# Patient Record
Sex: Female | Born: 1945
Health system: Southern US, Community
[De-identification: ages and names within clinical notes are randomized; demographics above are authoritative.]

## PROBLEM LIST (undated history)

## (undated) DIAGNOSIS — H269 Unspecified cataract: Secondary | ICD-10-CM

## (undated) DIAGNOSIS — M810 Age-related osteoporosis without current pathological fracture: Secondary | ICD-10-CM

## (undated) DIAGNOSIS — E785 Hyperlipidemia, unspecified: Secondary | ICD-10-CM

## (undated) DIAGNOSIS — K219 Gastro-esophageal reflux disease without esophagitis: Secondary | ICD-10-CM

## (undated) DIAGNOSIS — T7840XA Allergy, unspecified, initial encounter: Secondary | ICD-10-CM

## (undated) DIAGNOSIS — H9193 Unspecified hearing loss, bilateral: Secondary | ICD-10-CM

## (undated) DIAGNOSIS — M8580 Other specified disorders of bone density and structure, unspecified site: Secondary | ICD-10-CM

## (undated) DIAGNOSIS — E1165 Type 2 diabetes mellitus with hyperglycemia: Secondary | ICD-10-CM

## (undated) DIAGNOSIS — Z974 Presence of external hearing-aid: Secondary | ICD-10-CM

## (undated) DIAGNOSIS — C4442 Squamous cell carcinoma of skin of scalp and neck: Secondary | ICD-10-CM

## (undated) DIAGNOSIS — R011 Cardiac murmur, unspecified: Secondary | ICD-10-CM

## (undated) DIAGNOSIS — E119 Type 2 diabetes mellitus without complications: Secondary | ICD-10-CM

## (undated) HISTORY — DX: Unspecified cataract: H26.9

## (undated) HISTORY — DX: Age-related osteoporosis without current pathological fracture: M81.0

## (undated) HISTORY — DX: Cardiac murmur, unspecified: R01.1

## (undated) HISTORY — DX: Hyperlipidemia, unspecified: E78.5

## (undated) HISTORY — DX: Type 2 diabetes mellitus without complications: E11.9

## (undated) HISTORY — DX: Gastro-esophageal reflux disease without esophagitis: K21.9

## (undated) HISTORY — PX: COSMETIC SURGERY: SHX468

## (undated) HISTORY — DX: Type 2 diabetes mellitus with hyperglycemia: E11.65

## (undated) HISTORY — DX: Other specified disorders of bone density and structure, unspecified site: M85.80

## (undated) HISTORY — PX: RHINOPLASTY: SUR1284

## (undated) HISTORY — PX: EYE SURGERY: SHX253

## (undated) HISTORY — DX: Squamous cell carcinoma of skin of scalp and neck: C44.42

## (undated) HISTORY — PX: WRIST SURGERY: SHX841

## (undated) HISTORY — DX: Allergy, unspecified, initial encounter: T78.40XA

## (undated) HISTORY — DX: Unspecified hearing loss, bilateral: H91.93

---

## 2004-04-19 HISTORY — PX: GANGLION CYST EXCISION: SHX1691

## 2010-01-26 ENCOUNTER — Encounter: Payer: Self-pay | Admitting: Family Medicine

## 2010-01-26 ENCOUNTER — Encounter: Payer: Self-pay | Admitting: Internal Medicine

## 2010-01-26 ENCOUNTER — Other Ambulatory Visit: Admission: RE | Admit: 2010-01-26 | Discharge: 2010-01-26 | Payer: Self-pay | Admitting: Family Medicine

## 2010-01-26 ENCOUNTER — Ambulatory Visit: Payer: Self-pay | Admitting: Internal Medicine

## 2010-01-26 LAB — CONVERTED CEMR LAB: Pap Smear: NORMAL

## 2010-02-02 ENCOUNTER — Encounter: Payer: Self-pay | Admitting: Family Medicine

## 2010-02-02 ENCOUNTER — Encounter (INDEPENDENT_AMBULATORY_CARE_PROVIDER_SITE_OTHER): Payer: Self-pay | Admitting: *Deleted

## 2010-02-17 HISTORY — PX: COLONOSCOPY: SHX174

## 2010-02-20 ENCOUNTER — Encounter (INDEPENDENT_AMBULATORY_CARE_PROVIDER_SITE_OTHER): Payer: Self-pay | Admitting: *Deleted

## 2010-02-27 ENCOUNTER — Ambulatory Visit: Payer: Self-pay | Admitting: Internal Medicine

## 2010-02-27 ENCOUNTER — Encounter: Payer: Self-pay | Admitting: Family Medicine

## 2010-02-27 LAB — HM MAMMOGRAPHY: HM Mammogram: NORMAL

## 2010-03-02 ENCOUNTER — Encounter: Payer: Self-pay | Admitting: Family Medicine

## 2010-03-02 ENCOUNTER — Encounter (INDEPENDENT_AMBULATORY_CARE_PROVIDER_SITE_OTHER): Payer: Self-pay | Admitting: *Deleted

## 2010-03-11 ENCOUNTER — Ambulatory Visit: Payer: Self-pay | Admitting: Internal Medicine

## 2010-03-11 DIAGNOSIS — Z860100 Personal history of colon polyps, unspecified: Secondary | ICD-10-CM | POA: Insufficient documentation

## 2010-03-11 LAB — HM COLONOSCOPY

## 2010-03-15 ENCOUNTER — Encounter: Payer: Self-pay | Admitting: Internal Medicine

## 2010-05-19 NOTE — Letter (Signed)
Summary: [facility] Instructions  [facility]  [address] [place], [place] [address]   Phone: [phone]  Fax: [phone]       Betty Sanders    17-Aug-1945    MRN: [id number]        Procedure Day Dorna Bloom:  Wednesday 03/11/2010     Arrival Time:  8:00 am      Procedure Time:  9:00 am     Location of Procedure:                    _x _  [facility] (4th Floor)                        PREPARATION FOR COLONOSCOPY WITH MOVIPREP   Starting 5 days prior to your procedure Friday 11/18 do not eat nuts, seeds, popcorn, corn, beans, peas,  salads, or any raw vegetables.  Do not take any fiber supplements (e.g. Metamucil, Citrucel, and Benefiber).  THE DAY BEFORE YOUR PROCEDURE         DATE: Tuesday 11/22  1.  Drink clear liquids the entire day-NO SOLID FOOD  2.  Do not drink anything colored red or purple.  Avoid juices with pulp.  No orange juice.  3.  Drink at least 64 oz. (8 glasses) of fluid/clear liquids during the day to prevent dehydration and help the prep work efficiently.  CLEAR LIQUIDS INCLUDE: Water Jello Ice Popsicles Tea (sugar ok, no milk/cream) Powdered fruit flavored drinks Coffee (sugar ok, no milk/cream) Gatorade Juice: apple, white grape, white cranberry  Lemonade Clear bullion, consomm, broth Carbonated beverages (any kind) Strained chicken noodle soup Hard Candy                             4.  In the morning, mix first dose of MoviPrep solution:    Empty 1 Pouch A and 1 Pouch B into the disposable container    Add lukewarm drinking water to the top line of the container. Mix to dissolve    Refrigerate (mixed solution should be used within 24 hrs)  5.  Begin drinking the prep at 5:00 p.m. The MoviPrep container is divided by 4 marks.   Every 15 minutes drink the solution down to the next mark (approximately 8 oz) until the full liter is complete.   6.  Follow completed prep with 16 oz of clear liquid of your choice  (Nothing red or purple).  Continue to drink clear liquids until bedtime.  7.  Before going to bed, mix second dose of MoviPrep solution:    Empty 1 Pouch A and 1 Pouch B into the disposable container    Add lukewarm drinking water to the top line of the container. Mix to dissolve    Refrigerate  THE DAY OF YOUR PROCEDURE      DATE: Wednesday 11/23  Beginning at 4:00 a.m. (5 hours before procedure):         1. Every 15 minutes, drink the solution down to the next mark (approx 8 oz) until the full liter is complete.  2. Follow completed prep with 16 oz. of clear liquid of your choice.    3. You may drink clear liquids until 7:00 am (2 HOURS BEFORE PROCEDURE).   MEDICATION INSTRUCTIONS  Unless otherwise instructed, you should take regular prescription medications with a small sip of water   as early as possible the  morning of your procedure.           OTHER INSTRUCTIONS  You will need a responsible adult at least 63 years of age to accompany you and drive you home.   This person must remain in the waiting room during your procedure.  Wear loose fitting clothing that is easily removed.  Leave jewelry and other valuables at home.  However, you may wish to bring a book to read or  an iPod/MP3 player to listen to music as you wait for your procedure to start.  Remove all body piercing jewelry and leave at home.  Total time from sign-in until discharge is approximately 2-3 hours.  You should go home directly after your procedure and rest.  You can resume normal activities the  day after your procedure.  The day of your procedure you should not:   Drive   Make legal decisions   Operate machinery   Drink alcohol   Return to work  You will receive specific instructions about eating, activities and medications before you leave.    The above instructions have been reviewed and explained to me by   Sherren Kerns RN  February 27, 2010 8:12 AM    I fully understand  and can verbalize these instructions _____________________________ Date _________

## 2010-05-19 NOTE — Miscellaneous (Signed)
  Clinical Lists Changes  Observations: Added new observation of PAP DUE: 01/2013 (02/02/2010 10:36) Added new observation of PAP SMEAR: normal (01/26/2010 10:36)      Preventive Care Screening  Pap Smear:    Date:  01/26/2010    Next Due:  01/2013    Results:  normal

## 2010-05-19 NOTE — Letter (Signed)
Summary: Pre Visit Letter Revised  [facility]  [address] [place], [place] [address]   Phone: [phone]  Fax: [phone]        01/26/2010 MRN: [id number]  [facility] [address] [place], [place]  [address]             Procedure Date: 11-23 at 9am   Welcome to the Gastroenterology Division at [facility].    You are scheduled to see a nurse for your pre-procedure visit on 02-27-10 at 8am on the 3rd floor at [facility], [address]  We ask that you try to arrive at our office 15 minutes prior to your appointment time to allow for check-in.  Please take a minute to review the attached form.  If you answer "Yes" to one or more of the questions on the first page, we ask that you call the person listed at your earliest opportunity.  If you answer "No" to all of the questions, please complete the rest of the form and bring it to your appointment.    Your nurse visit will consist of discussing your medical and surgical history, your immediate family medical history, and your medications.   If you are unable to list all of your medications on the form, please bring the medication bottles to your appointment and we will list them.  We will need to be aware of both prescribed and over the counter drugs.  We will need to know exact dosage information as well.    Please be prepared to read and sign documents such as consent forms, a financial agreement, and acknowledgement forms.  If necessary, and with your consent, a friend or relative is welcome to sit-in on the nurse visit with you.  Please bring your insurance card so that we may make a copy of it.  If your insurance requires a referral to see a specialist, please bring your referral form from your primary care physician.  No co-pay is required for this nurse visit.     If you cannot keep your appointment, please call [phone] to cancel or reschedule prior to your appointment date.   This allows [place] the opportunity to schedule an appointment for another patient in need of care.    Thank you for choosing [facility] for your medical needs.  We appreciate the opportunity to care for you.  Please visit [place] at our website  to learn more about our practice.  Sincerely, The Gastroenterology Division

## 2010-05-19 NOTE — Assessment & Plan Note (Signed)
Summary: NEW PT TO ESTABH/DLO   Vital Signs:  Patient profile:   65 year old female Height:      65 inches Weight:      197.75 pounds BMI:     33.03 Temp:     98.6 degrees F oral Pulse rate:   74 / minute Pulse rhythm:   regular BP sitting:   110 / 60  (left arm) Cuff size:   large  Vitals Entered By: Selena Batten Dance CMA Duncan Dull) (January 26, 2010 9:38 AM) CC: New patient to establish care   History of Present Illness: CC: new patient to establish  Used to live in [place].    told may have RA 2/2 elevated RF.  h/o arrhythmias and murmur in past.  No SOB, LOC.    went through menopause at around early 15s ( ~65 yo).  preventative - last mammo 3 years ago, due.  unsure about last pap smear.  has had stool checked always normal.  UTD tetanus 2008.  Receives flu shot at work.    -  Date:  10/02/2007    TD booster Td  Current Medications (verified): 1)  None  Allergies (verified): No Known Drug Allergies  Past History:  Past Surgical History: rhinoplasty 1970s L wrist cyst removed 2006  Family History: M: D 63yo healthy F: D 62yo lung CA Cousin: colon CA Aunt: BRCA Aunt: Cervical CA MGF: DM PGF: CVA  No CAD/MI, no other CA  Social History: no smoking, rarely EtOH, no rec drugs Occupation: Engineer, manufacturing systems with [employer] Lives with husband, 1 dog (Yorkie)  Review of Systems  The patient denies anorexia, fever, weight loss, weight gain, vision loss, decreased hearing, hoarseness, chest pain, syncope, dyspnea on exertion, peripheral edema, prolonged cough, headaches, hemoptysis, abdominal pain, melena, hematochezia, severe indigestion/heartburn, hematuria, incontinence, muscle weakness, suspicious skin lesions, transient blindness, difficulty walking, depression, unusual weight change, and breast masses.         + mild indigestion sees derm for spot on scalp  Physical Exam  General:  Well-developed,well-nourished,in no  acute distress; alert,appropriate and cooperative throughout examination Head:  Normocephalic and atraumatic without obvious abnormalities. No apparent alopecia or balding. Eyes:  No corneal or conjunctival inflammation noted. EOMI. Perrla.  Ears:  External ear exam shows no significant lesions or deformities.  Otoscopic examination reveals clear canals, tympanic membranes are intact bilaterally without bulging, retraction, inflammation or discharge. Hearing is grossly normal bilaterally. Nose:  External nasal examination shows no deformity or inflammation. Nasal mucosa are pink and moist without lesions or exudates. Mouth:  Oral mucosa and oropharynx without lesions or exudates.  Teeth in good repair. Neck:  No deformities, masses, or tenderness noted.  no bruits Lungs:  Normal respiratory effort, chest expands symmetrically. Lungs are clear to auscultation, no crackles or wheezes. Heart:  nl S1, S2, 2/6 SEM Abdomen:  Bowel sounds positive,abdomen soft and non-tender without masses, organomegaly or hernias noted. Genitalia:  Pelvic Exam:        External: normal female genitalia without lesions or masses        Vagina: normal without lesions or masses        Cervix: normal without lesions or masses        Adnexa: normal bimanual exam without masses or fullness        Uterus: normal by palpation        Pap smear: performed Msk:  No deformity or scoliosis noted of thoracic or lumbar spine.  Pulses:  2+ rad pulses Extremities:  No clubbing, cyanosis, edema, or deformity noted with normal full range of motion of all joints.   Neurologic:  CN grossly intact, station and gait intact Skin:  Intact without suspicious lesions or rashes Psych:  full affect   Impression & Recommendations:  Problem # 1:  HEALTH MAINTENANCE EXAM (ICD-V70.0) updated immunizations.  preventative services due scheduled.  Problem # 2:  ROUTINE GYNECOLOGICAL EXAMINATION (ICD-V72.31) with pap.  Problem # 3:  SPECIAL  SCREENING FOR MALIGNANT NEOPLASMS COLON (ICD-V76.51) set up with colonoscopy. fmhx coloncancer 50's cousin, passed away. Orders: Gastroenterology Referral (GI)  Problem # 4:  OTHER SCREENING MAMMOGRAM (ICD-V76.12) set up with mammogram.  hasn't had one in several years, always normal. Orders: Radiology Referral (Radiology)  Patient Instructions: 1)  Pleasure to meet you today. 2)  Please return as needed or next year for CPE. 3)  Please bring in copy of blood work results in November for work. 4)  Call clinic with questions.  Prior Medications: None Current Allergies (reviewed today): No known allergies   Appended Document: Orders Update    Clinical Lists Changes  Problems: Added new problem of SCREENING FOR MALIGNANT NEOPLASM OF THE CERVIX (ICD-V76.2) Orders: Added new Service order of Pap Smear, Thin Prep ( Collection of) [phone]) - Signed

## 2010-05-19 NOTE — Miscellaneous (Signed)
  Clinical Lists Changes  Observations: Added new observation of MAMMO DUE: 02/2011 (03/02/2010 9:05) Added new observation of MAMMOGRAM: normal (02/27/2010 9:06)      Preventive Care Screening  Mammogram:    Date:  02/27/2010    Next Due:  02/2011    Results:  normal     Birads-1

## 2010-05-19 NOTE — Letter (Signed)
Summary: Patient Notice- Polyp Results  [facility]  [address] [place], [place] [address]   Phone: [phone]  Fax: [phone]        March 15, 2010 MRN: [id number]    [facility] [address] [place], [place]  [address]    Dear Ms. Piercefield,  One of the polyps removed from your colon was adenomatous. This means that it was pre-cancerous or that it had the potential to change into cancer over time. the other polyps were hyperplastic polyps and in general, do not have cancer potential.  I recommend that you have a repeat colonoscopy in 5 years to determine if you have developed any new polyps over time and screen for colorectal cancer. If you develop any new rectal bleeding, abdominal pain or significant bowel habit changes, please contact us before then.   Please call us if you are having persistent problems or have questions about your condition that have not been fully answered at this time.  Sincerely,  Iva Boop MD, [facility]  This letter has been electronically signed by your physician.  Appended Document: Patient Notice- Polyp Results Letter mailed  Appended Document: Patient Notice- Polyp Results    Clinical Lists Changes  Observations: Added new observation of PAST SURG HX: rhinoplasty 1970s L wrist cyst removed 2006 Colonoscopy 02/2010 - tubular adenoma polyp x1, rpt 5 years [Gessner] (03/18/2010 11:27)        Past History:  Past Surgical History: rhinoplasty 1970s L wrist cyst removed 2006 Colonoscopy 02/2010 - tubular adenoma polyp x1, rpt 5 years [Gessner]

## 2010-05-19 NOTE — Procedures (Signed)
Summary: Colonoscopy  Patient: [name] Note: All result statuses are Final unless otherwise noted.  Tests: (1) Colonoscopy (COL)   COL Colonoscopy           DONE     [facility]     [address]     [place], [place]  [address]           COLONOSCOPY PROCEDURE REPORT           PATIENT:  Betty Sanders, Betty Sanders  MR#:  [id number]     BIRTHDATE:  May 22, 1945, 64 yrs. old  GENDER:  female     ENDOSCOPIST:  Iva Boop, MD, [facility]     REF. BY:  Tillman Abide, M.D.     PROCEDURE DATE:  03/11/2010     PROCEDURE:  Colonoscopy with snare polypectomy     ASA CLASS:  Class I     INDICATIONS:  Routine Risk Screening     MEDICATIONS:   Fentanyl 50 mcg IV, Versed 6 mg IV           DESCRIPTION OF PROCEDURE:   After the risks benefits and     alternatives of the procedure were thoroughly explained, informed     consent was obtained.  Digital rectal exam was performed and     revealed no abnormalities.   The LB [id number]-[id number] endoscope     was introduced through the anus and advanced to the cecum, which     was identified by both the appendix and ileocecal valve, without     limitations.  The quality of the prep was excellent, using     MoviPrep.  The instrument was then slowly withdrawn as the colon     was fully examined. Insertion: 3:36 minutes Withdrawal: 10:50     minutes     <<PROCEDUREIMAGES>>           FINDINGS:  Four polyps were found. Transverse (1), splenic flexure     (1), rectal (2) diminutive polyps removed. Polyps were snared     without cautery. Retrieval was successful. This was otherwise a     normal examination of the colon. Includes right colon     retroflexion.   Retroflexed views in the rectum revealed no     abnormalities.    The scope was then withdrawn from the patient     and the procedure completed.           COMPLICATIONS:  None     ENDOSCOPIC IMPRESSION:     1) Four diminutive polyps removed     2) Otherwise normal examination, excellent  prep           REPEAT EXAM:  In for Colonoscopy, pending biopsy results.           Iva Boop, MD, [facility]           CC:  Karie Schwalbe, MD and The Patient           n.     eSIGNED:   Iva Boop at 03/11/2010 09:59 AM           Etta Grandchild, [id number]  Note: An exclamation mark (!) indicates a result that was not dispersed into the flowsheet. Document Creation Date: 03/11/2010 9:59 AM _______________________________________________________________________  (1) Order result status: Final Collection or observation date-time: 03/11/2010 09:49 Requested date-time:  Receipt date-time:  Reported date-time:  Referring Physician:   Ordering Physician: Stan Head [phone]) Specimen Source:  Source: Launa Grill  Order Number: [id number] Lab site:   Appended Document: Colonoscopy   Colonoscopy  Procedure date:  03/11/2010  Findings:          1) Four diminutive polyps removed 1 ADENOMA and HYPERPLASTIC POLYPS (3)     2) Otherwise normal examination, excellent prep  Comments:      Repeat colonoscopy in 5 years.   Procedures Next Due Date:    Colonoscopy: 03/2015   Appended Document: Colonoscopy     Procedures Next Due Date:    Colonoscopy: 03/2015

## 2010-05-19 NOTE — Letter (Signed)
Summary: Results Follow up Letter  [place] [facility]  [address] [place], [place] [address]   Phone: [phone]  Fax: [phone]    02/02/2010 MRN: [id number]  [facility] [address] [place], [place]  [address]  Dear Ms. Topham,  The following are the results of your recent test(s):  Test         Result    Pap Smear:        Normal __X___  Not Normal _____ Comments: Repeat in 3 years ______________________________________________________ Cholesterol: LDL(Bad cholesterol):         Your goal is less than:         HDL (Good cholesterol):       Your goal is more than: Comments:  ______________________________________________________ Mammogram:        Normal _____  Not Normal _____ Comments:  ___________________________________________________________________ Hemoccult:        Normal _____  Not normal _______ Comments:    _____________________________________________________________________ Other Tests:    We routinely do not discuss normal results over the telephone.  If you desire a copy of the results, or you have any questions about this information we can discuss them at your next office visit.   Sincerely,      Dr. Eustaquio Boyden

## 2010-05-19 NOTE — Letter (Signed)
Summary: Results Follow up Letter  [place] [facility]  [address] [place], [place] [address]   Phone: [phone]  Fax: [phone]    03/02/2010 MRN: [id number]  [facility] [address] [place], [place]  [address]  Dear Ms. Ruelas,  The following are the results of your recent test(s):  Test         Result    Pap Smear:        Normal _____  Not Normal _____ Comments: ______________________________________________________ Cholesterol: LDL(Bad cholesterol):         Your goal is less than:         HDL (Good cholesterol):       Your goal is more than: Comments:  ______________________________________________________ Mammogram:        Normal __X___  Not Normal _____ Comments: Repeat in 1 year  ___________________________________________________________________ Hemoccult:        Normal _____  Not normal _______ Comments:    _____________________________________________________________________ Other Tests:    We routinely do not discuss normal results over the telephone.  If you desire a copy of the results, or you have any questions about this information we can discuss them at your next office visit.   Sincerely,      [place]ia [place]

## 2010-05-19 NOTE — Miscellaneous (Signed)
Summary: previsit prep/rm  Clinical Lists Changes  Medications: Added new medication of MOVIPREP 100 GM  SOLR (PEG-KCL-NACL-NASULF-NA ASC-C) As per prep instructions. - Signed Rx of MOVIPREP 100 GM  SOLR (PEG-KCL-NACL-NASULF-NA ASC-C) As per prep instructions.;  #1 x 0;  Signed;  Entered by: Sherren Kerns RN;  Authorized by: Iva Boop MD, [facility];  Method used: Electronically to [facility]*, [address], [address] Shipping/mailing, [place], [place]  [address], Ph: [phone], Fax: [phone] Observations: Added new observation of ALLERGY REV: Done (02/27/2010 8:00)    Prescriptions: MOVIPREP 100 GM  SOLR (PEG-KCL-NACL-NASULF-NA ASC-C) As per prep instructions.  #1 x 0   Entered by:   Sherren Kerns RN   Authorized by:   Iva Boop MD, [facility]   Signed by:   Sherren Kerns RN on 02/27/2010   Method used:   Electronically to        [facility]* (retail)       [address]       [address] Shipping/mailing       [place], [place]  [address]       Ph: [phone]       Fax: [phone]   RxID:   [id number]

## 2011-04-01 ENCOUNTER — Encounter: Payer: Self-pay | Admitting: Family Medicine

## 2011-04-01 ENCOUNTER — Telehealth: Payer: Self-pay | Admitting: Internal Medicine

## 2011-04-01 NOTE — Telephone Encounter (Signed)
Patient has flu like symptoms.  Made an appt. For in the morning.

## 2011-04-02 ENCOUNTER — Ambulatory Visit (INDEPENDENT_AMBULATORY_CARE_PROVIDER_SITE_OTHER): Payer: 59 | Admitting: Family Medicine

## 2011-04-02 ENCOUNTER — Encounter: Payer: Self-pay | Admitting: Family Medicine

## 2011-04-02 DIAGNOSIS — R6889 Other general symptoms and signs: Secondary | ICD-10-CM

## 2011-04-02 NOTE — Progress Notes (Signed)
  Subjective:    Patient ID: Betty Sanders, female    DOB: 18-Aug-1945, 65 y.o.   MRN: [id number]  HPI CC: flu?  5d ago started having chills,fever sudden onset.  Yesterday Tmax 101.  Earache, hoarse.  Felt tightness in chest and HA described as throughout head, no neck stiffness.  Has been in bed all week.  Today started feeling better.  Last fever was mid day yesterday.  Diarrhea x 1.  + body aches.  Has tried advil, mucinex.  Drinking plenty of water, OJ.  No abd pain, n/v.  No ear pain/tooth pain.  Had flu shot this year.  No sick contacts at home.  No smokers at home.  No h/o asthma.  Getting dental work, just completed 2 courses of antibiotics.  Review of Systems Per HPI    Objective:   Physical Exam  Nursing note and vitals reviewed. Constitutional: She appears well-developed and well-nourished. No distress.  HENT:  Head: Normocephalic and atraumatic.  Right Ear: Hearing, tympanic membrane, external ear and ear canal normal.  Left Ear: Hearing, tympanic membrane, external ear and ear canal normal.  Nose: No mucosal edema or rhinorrhea. Right sinus exhibits no maxillary sinus tenderness and no frontal sinus tenderness. Left sinus exhibits no maxillary sinus tenderness and no frontal sinus tenderness.  Mouth/Throat: Uvula is midline, oropharynx is clear and moist and mucous membranes are normal. No oropharyngeal exudate, posterior oropharyngeal edema, posterior oropharyngeal erythema or tonsillar abscesses.       Congested  Eyes: Conjunctivae and EOM are normal. Pupils are equal, round, and reactive to light. No scleral icterus.  Neck: Normal range of motion. Neck supple.       FROM without pain.  No midline neck tenderness  Cardiovascular: Normal rate, regular rhythm, normal heart sounds and intact distal pulses.   No murmur heard. Pulmonary/Chest: Effort normal and breath sounds normal. No respiratory distress. She has no wheezes. She has no rales.  Lymphadenopathy:   She has no cervical adenopathy.  Skin: Skin is warm and dry. No rash noted.       Assessment & Plan:

## 2011-04-02 NOTE — Assessment & Plan Note (Signed)
Sudden onset resp illness with spiking fevers, out of commission for last 5 days. Anticipate influenza. Supportive care.  Advised of red flags to watch out for indicating bacterial infection, notify me if that is the case.

## 2011-04-20 DIAGNOSIS — C4442 Squamous cell carcinoma of skin of scalp and neck: Secondary | ICD-10-CM

## 2011-04-20 HISTORY — DX: Squamous cell carcinoma of skin of scalp and neck: C44.42

## 2012-02-18 DIAGNOSIS — E1169 Type 2 diabetes mellitus with other specified complication: Secondary | ICD-10-CM | POA: Insufficient documentation

## 2012-02-18 DIAGNOSIS — IMO0002 Reserved for concepts with insufficient information to code with codable children: Secondary | ICD-10-CM

## 2012-02-18 DIAGNOSIS — E118 Type 2 diabetes mellitus with unspecified complications: Secondary | ICD-10-CM | POA: Insufficient documentation

## 2012-02-18 DIAGNOSIS — E1165 Type 2 diabetes mellitus with hyperglycemia: Secondary | ICD-10-CM

## 2012-02-18 HISTORY — DX: Type 2 diabetes mellitus with hyperglycemia: E11.65

## 2012-02-18 HISTORY — DX: Reserved for concepts with insufficient information to code with codable children: IMO0002

## 2012-02-23 ENCOUNTER — Other Ambulatory Visit: Payer: Self-pay | Admitting: Family Medicine

## 2012-02-23 ENCOUNTER — Ambulatory Visit (INDEPENDENT_AMBULATORY_CARE_PROVIDER_SITE_OTHER): Payer: 59 | Admitting: Family Medicine

## 2012-02-23 ENCOUNTER — Encounter: Payer: Self-pay | Admitting: Family Medicine

## 2012-02-23 VITALS — BP 110/70 | HR 60 | Temp 98.1°F | Ht 64.25 in | Wt 181.8 lb

## 2012-02-23 DIAGNOSIS — Z1231 Encounter for screening mammogram for malignant neoplasm of breast: Secondary | ICD-10-CM

## 2012-02-23 DIAGNOSIS — Z0000 Encounter for general adult medical examination without abnormal findings: Secondary | ICD-10-CM | POA: Insufficient documentation

## 2012-02-23 DIAGNOSIS — Z0001 Encounter for general adult medical examination with abnormal findings: Secondary | ICD-10-CM | POA: Insufficient documentation

## 2012-02-23 DIAGNOSIS — R739 Hyperglycemia, unspecified: Secondary | ICD-10-CM | POA: Insufficient documentation

## 2012-02-23 LAB — BASIC METABOLIC PANEL
BUN: 9 mg/dL (ref 6–23)
GFR: 83.93 mL/min (ref >60.00)
Potassium: 4.3 mEq/L (ref 3.5–5.1)
Sodium: 137 mEq/L (ref 135–145)

## 2012-02-23 LAB — LDL CHOLESTEROL, DIRECT: Direct LDL: 162.2 mg/dL

## 2012-02-23 LAB — LIPID PANEL
HDL: 29.6 mg/dL — ABNORMAL LOW (ref >39.00)
Total CHOL/HDL Ratio: 8
VLDL: 33.2 mg/dL (ref 0.0–40.0)

## 2012-02-23 NOTE — Assessment & Plan Note (Addendum)
Preventative protocols reviewed and updated unless pt declined. Discussed healthy diet and lifestyle. UTD immunizations.  Pt will check into zostavax and schedule mammogram. Recommended she f/u with derm for skin check. Blood work today.

## 2012-02-23 NOTE — Patient Instructions (Signed)
Call to schedule mammogram.  If any issues, call us and we will schedule for you. Call your insurace about the shingles shot to see if it is covered or how much it would cost and where is cheaper (here or pharmacy).  If you want to receive here, call for nurse visit. Blood work today. Return to see dermatologist. Good to see you today.  Call us with questions.

## 2012-02-23 NOTE — Progress Notes (Signed)
Subjective:    Patient ID: Betty Sanders, female    DOB: [birth year], 66 y.o.   MRN: [id number]  HPI CC: CPE  Husband says she has trouble hearing.  Would like hearing screen today.  Preventative: Colonoscopy 02/2010, Dr. Leone Payor, rec rpt 5 yrs given polyps. Mammogram 02/2010, Birads 1 would like to reschedule. Well woman exam - always normal.  Last pap was 02/2010.   Flu done at work. Td 2009 Shingles shot - discussed, pt wiil call insurance  Denies depression, sadness, anhedonia. No falls in last year.  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 Activity: kayak, restarting treadmill, wears fit bit - wants to get to 10000steps/day Diet: My.fitness.pal 1200cal/day, good water, fruits/vegetables daily  Medications and allergies reviewed and updated in chart.  Past histories reviewed and updated if relevant as below. Patient Active Problem List  Diagnosis  . Influenza-like illness  . Healthcare maintenance   Past Medical History  Diagnosis Date  . 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 Mayford Knife)   Past Surgical History  Procedure Date  . Rhinoplasty 1970's  . Ganglion cyst excision 2006    Left wrist  . Colonoscopy 11/11    tubular adenoma polyp x 1; repeat 5 years--Dr. Leone Payor   History  Substance Use Topics  . Smoking status: Former Smoker    Quit date: 04/19/1968  . Smokeless tobacco: Never Used  . Alcohol Use: Yes     Comment: Rare   Family History  Problem Relation Age of Onset  . Lung cancer Father   . Colon cancer Cousin   . Breast cancer      Aunt  . Cervical cancer      Aunt  . Diabetes Maternal Grandfather   . Stroke Paternal Grandfather   . CAD Neg Hx    Allergies  Allergen Reactions  . Sulfa Antibiotics     Possible rxn, unsure.   No current outpatient prescriptions on file prior to visit.    Review of Systems  Constitutional: Negative for fever, chills,  activity change, appetite change, fatigue and unexpected weight change.  HENT: Negative for hearing loss and neck pain.   Eyes: Negative for visual disturbance.  Respiratory: Negative for cough, chest tightness, shortness of breath and wheezing.   Cardiovascular: Negative for chest pain, palpitations and leg swelling.  Gastrointestinal: Negative for nausea, vomiting, abdominal pain, diarrhea, constipation, blood in stool and abdominal distention.  Genitourinary: Negative for hematuria and difficulty urinating.  Musculoskeletal: Negative for myalgias and arthralgias.  Skin: Negative for rash.  Neurological: Positive for headaches. Negative for dizziness, seizures and syncope.  Psychiatric/Behavioral: Negative for dysphoric mood. The patient is not nervous/anxious.        Objective:   Physical Exam  Nursing note and vitals reviewed. Constitutional: She is oriented to person, place, and time. She appears well-developed and well-nourished. No distress.  HENT:  Head: Normocephalic and atraumatic.  Right Ear: Hearing, tympanic membrane, external ear and ear canal normal.  Left Ear: Hearing, tympanic membrane, external ear and ear canal normal.  Nose: Nose normal.  Mouth/Throat: Uvula is midline, oropharynx is clear and moist and mucous membranes are normal. No oropharyngeal exudate, posterior oropharyngeal edema, posterior oropharyngeal erythema or tonsillar abscesses.  Eyes: Conjunctivae normal and EOM are normal. Pupils are equal, round, and reactive to light. No scleral icterus.  Neck: Normal range of motion. Neck supple.  Cardiovascular: Normal rate, regular rhythm,  normal heart sounds and intact distal pulses.   No murmur heard. Pulses:      Radial pulses are 2+ on the right side, and 2+ on the left side.  Pulmonary/Chest: Effort normal and breath sounds normal. No respiratory distress. She has no wheezes. She has no rales. Right breast exhibits no inverted nipple, no mass, no nipple  discharge, no skin change and no tenderness. Left breast exhibits inverted nipple. Left breast exhibits no mass, no nipple discharge, no skin change and no tenderness. Breasts are symmetrical.  Abdominal: Soft. Bowel sounds are normal. She exhibits no distension and no mass. There is no tenderness. There is no rebound and no guarding.  Musculoskeletal: Normal range of motion. She exhibits no edema.  Lymphadenopathy:    She has no cervical adenopathy.    She has no axillary adenopathy.       Right axillary: No lateral adenopathy present.       Left axillary: No lateral adenopathy present.      Right: No supraclavicular adenopathy present.       Left: No supraclavicular adenopathy present.  Neurological: She is alert and oriented to person, place, and time.       CN grossly intact, station and gait intact  Skin: Skin is warm and dry. No rash noted.       AKs on forehead.  Psychiatric: She has a normal mood and affect. Her behavior is normal. Judgment and thought content normal.      Assessment & Plan:

## 2012-02-23 NOTE — Addendum Note (Signed)
Addended by: Eustaquio Boyden on: 02/23/2012 09:19 AM   Modules accepted: Orders

## 2012-02-24 ENCOUNTER — Telehealth: Payer: Self-pay

## 2012-02-24 NOTE — Telephone Encounter (Signed)
Patient notified. Shirlee Limerick please send order to [facility] fax # [phone].

## 2012-02-24 NOTE — Telephone Encounter (Signed)
Order faxed. mK

## 2012-02-24 NOTE — Telephone Encounter (Signed)
Pt left v/m returning call and requesting call back for lab results and also pt request mammogram order faxed to [facility] [phone].

## 2012-03-01 ENCOUNTER — Other Ambulatory Visit (INDEPENDENT_AMBULATORY_CARE_PROVIDER_SITE_OTHER): Payer: 59

## 2012-03-01 DIAGNOSIS — R739 Hyperglycemia, unspecified: Secondary | ICD-10-CM

## 2012-03-01 DIAGNOSIS — R7309 Other abnormal glucose: Secondary | ICD-10-CM

## 2012-03-01 LAB — HEMOGLOBIN A1C: Hgb A1c MFr Bld: 10.2 % — ABNORMAL HIGH (ref 4.6–6.5)

## 2012-03-08 ENCOUNTER — Encounter: Payer: Self-pay | Admitting: Family Medicine

## 2012-03-08 ENCOUNTER — Ambulatory Visit (INDEPENDENT_AMBULATORY_CARE_PROVIDER_SITE_OTHER): Payer: 59 | Admitting: Family Medicine

## 2012-03-08 ENCOUNTER — Telehealth: Payer: Self-pay

## 2012-03-08 VITALS — BP 116/68 | HR 60 | Temp 98.4°F | Wt 179.8 lb

## 2012-03-08 DIAGNOSIS — E1165 Type 2 diabetes mellitus with hyperglycemia: Secondary | ICD-10-CM

## 2012-03-08 DIAGNOSIS — E785 Hyperlipidemia, unspecified: Secondary | ICD-10-CM | POA: Insufficient documentation

## 2012-03-08 MED ORDER — METFORMIN HCL 500 MG PO TABS: 500.0000 mg | ORAL_TABLET | Freq: Two times a day (BID) | ORAL | Status: DC

## 2012-03-08 MED ORDER — ONETOUCH ULTRA SYSTEM W/DEVICE KIT: 1.0000 | PACK | Freq: Once | Status: DC

## 2012-03-08 MED ORDER — GLUCOSE BLOOD VI STRP: ORAL_STRIP | Status: DC

## 2012-03-08 NOTE — Assessment & Plan Note (Signed)
LDL 160s, goal <100.  Discussed statin. Pt opts to wait until next visit prior to starting new med.

## 2012-03-08 NOTE — Patient Instructions (Signed)
You have developed diabetes. Start metformin 500mg  nightly for 1 week then increase to twice daily.  Watch stomach upset. Return in 6 weeks for follow up. Keep a good log 1 week prior to coming in - with some days checking fasting and some days checking 2 hours after meal. Continue exercise and healthy diet. Call us with questions. I've sent in glucose meter as well.

## 2012-03-08 NOTE — Progress Notes (Signed)
  Subjective:    Patient ID: Betty Sanders, female    DOB: 1945/07/25, 66 y.o.   MRN: [id number]  HPI CC: new dx DM  Betty Sanders returns today after recent CPE where she was found to have very elevated sugars.  Lab Results  Component Value Date   HGBA1C 10.2* 03/01/2012    No significant fmhx DM.  Husband is diabetic so pt has meter.  Pt checking fasting in am 150-160s.    Doing fitness pal.  Diet - 50% carbs, 25% fat, 25% protein.  Starting to exercise more - 1 hour on treadmill.  Good fruits/vegetables daily. Has lost about 7 lbs on home scale.   Wt Readings from Last 3 Encounters:  03/08/12 179 lb 12 oz (81.534 kg)  02/23/12 181 lb 12 oz (82.441 kg)  04/02/11 184 lb 4 oz (83.575 kg)   Went with husband to diabetes education 3 yrs ago.  Would like to defer DSME for now.  Past Medical History  Diagnosis Date  . 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 Mayford Knife)  . Type 2 diabetes mellitus, uncontrolled 02/2012     Review of Systems Per HPI    Objective:   Physical Exam  Nursing note and vitals reviewed. Constitutional: She appears well-developed and well-nourished. No distress.  HENT:  Head: Normocephalic and atraumatic.  Right Ear: External ear normal.  Left Ear: External ear normal.  Nose: Nose normal.  Mouth/Throat: Oropharynx is clear and moist. No oropharyngeal exudate.  Eyes: Conjunctivae normal and EOM are normal. Pupils are equal, round, and reactive to light. No scleral icterus.  Neck: Normal range of motion. Neck supple. Carotid bruit is not present.  Cardiovascular: Normal rate, regular rhythm and intact distal pulses.   Murmur (3/6 systolic) heard. Pulmonary/Chest: Effort normal and breath sounds normal. No respiratory distress. She has no wheezes. She has no rales.  Musculoskeletal: She exhibits no edema.       Diabetic foot exam: Normal inspection No skin breakdown No calluses  Normal DP/PT pulses Normal sensation  to light tough and monofilament Nails normal  Lymphadenopathy:    She has no cervical adenopathy.  Skin: Skin is warm and dry. No rash noted.  Psychiatric: She has a normal mood and affect.       Assessment & Plan:

## 2012-03-08 NOTE — Telephone Encounter (Signed)
Betty Sanders with [facility] outpatient pharmacy;Prescription sent in for one touch ultra glucose meter; does Dr Sharen Hones specifically want this meter or can a generic form of this meter be given.Please advise.

## 2012-03-08 NOTE — Assessment & Plan Note (Signed)
New dx. Declines DSME for now Discussed dx and management of diabetes.  Discussed healthy diet and lifestyle Start metformin. Keep log of sugars (sent in glucometer) rtc 6 wk for f/u Pt agrees with plan.

## 2012-03-09 NOTE — Telephone Encounter (Signed)
plz notify - ok to use generic glucometer and strips.

## 2012-03-09 NOTE — Telephone Encounter (Signed)
Pharmacy advised  

## 2012-03-10 ENCOUNTER — Encounter: Payer: Self-pay | Admitting: *Deleted

## 2012-04-26 ENCOUNTER — Ambulatory Visit: Payer: 59 | Admitting: Family Medicine

## 2012-05-03 ENCOUNTER — Ambulatory Visit (INDEPENDENT_AMBULATORY_CARE_PROVIDER_SITE_OTHER): Payer: 59 | Admitting: Family Medicine

## 2012-05-03 ENCOUNTER — Encounter: Payer: Self-pay | Admitting: Family Medicine

## 2012-05-03 VITALS — BP 118/72 | HR 60 | Temp 98.1°F | Wt 172.2 lb

## 2012-05-03 DIAGNOSIS — E785 Hyperlipidemia, unspecified: Secondary | ICD-10-CM

## 2012-05-03 DIAGNOSIS — E1165 Type 2 diabetes mellitus with hyperglycemia: Secondary | ICD-10-CM

## 2012-05-03 MED ORDER — METFORMIN HCL 500 MG PO TABS: 1000.0000 mg | ORAL_TABLET | Freq: Two times a day (BID) | ORAL | Status: DC

## 2012-05-03 NOTE — Assessment & Plan Note (Addendum)
Recent dx.  Checking sugars daily.  Improved #s but still not at goal. Slowly increase metformin to 1000mg  bid. emmi soln prescription provided today - generic diabetes type 2 and high A1c result.

## 2012-05-03 NOTE — Patient Instructions (Addendum)
Let's increase metformin to 1 pill in am and 2 pills at night for next 1-2 weeks.  If sugars are staying elevated, increase to two pills twice daily. Sugars are slowly improving but still a bit high. Exercises does help control fluctuations. Return in 3 months fasting for blood work and afterwards for visit. [facility] prescription today.

## 2012-05-03 NOTE — Progress Notes (Signed)
  Subjective:    Patient ID: Betty Sanders, female    DOB: [birth year], 67 y.o.   MRN: [id number]  HPI CC: f/u DM  Mrs Horsford presents today as folow up for recently diagnosed diabetes at physical this past year.  Lab Results  Component Value Date   HGBA1C 10.2* 03/01/2012    Has implemented healthy diet changes - see prior visit for details.  Was walking on treadmill 3 miles a day.  Has ordered another piece of exercise equiipment.  Foot exam last visit.  Brings log of sugars - 90-170 fasting, more recently creeping up to 130-150s (has been sick and less exercising as well as had company over for holidays).  Notes improved sugars when she was following regular routine.  Tolerating metformin well.  Using glucose buddy and fitness buddy.  Denies paresthesias.  Feels vision improved.  HLD - LDL was 160s.  Wt Readings from Last 3 Encounters:  05/03/12 172 lb 4 oz (78.132 kg)  03/08/12 179 lb 12 oz (81.534 kg)  02/23/12 181 lb 12 oz (82.441 kg)   Pulled muscle in left back recently.  Past Medical History  Diagnosis Date  . 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 Mayford Knife)  . Type 2 diabetes mellitus, uncontrolled 02/2012    declines DSME  . Dyslipidemia     Review of Systems Per HPI    Objective:   Physical Exam  Nursing note and vitals reviewed. Constitutional: She appears well-developed and well-nourished. No distress.  HENT:  Head: Normocephalic and atraumatic.  Mouth/Throat: Oropharynx is clear and moist. No oropharyngeal exudate.  Eyes: Conjunctivae normal and EOM are normal. Pupils are equal, round, and reactive to light. No scleral icterus.  Neck: Normal range of motion. Neck supple.  Cardiovascular: Normal rate, regular rhythm, normal heart sounds and intact distal pulses.   No murmur heard. Pulmonary/Chest: Effort normal and breath sounds normal. No respiratory distress. She has no wheezes. She has no rales.    Lymphadenopathy:    She has no cervical adenopathy.  Skin: Skin is warm and dry. No rash noted.  Psychiatric: She has a normal mood and affect.       Assessment & Plan:

## 2012-05-03 NOTE — Assessment & Plan Note (Signed)
Will recheck at next visit, hold statin until then per pt request.

## 2012-05-04 ENCOUNTER — Encounter: Payer: Self-pay | Admitting: Family Medicine

## 2012-05-17 ENCOUNTER — Telehealth: Payer: Self-pay | Admitting: Family Medicine

## 2012-05-17 NOTE — Telephone Encounter (Signed)
Patient Information:  Caller Name: Ernesha  Phone: [phone]  Patient: Betty Sanders  Gender: Female  DOB: [birth year]  Age: 67 Years  PCP: Eustaquio Boyden [facility])  Office Follow Up:  Does the office need to follow up with this patient?: No  Instructions For The Office: N/A   Symptoms  Reason For Call & Symptoms: Patient states she is having muscle spasms in the mid back pain and occassionally the abdominal area.  Reviewed Health History In EMR: Yes  Reviewed Medications In EMR: Yes  Reviewed Allergies In EMR: Yes  Reviewed Surgeries / Procedures: Yes  Date of Onset of Symptoms: 04/26/2012  Treatments Tried: Motrin 4 tabs daily  Treatments Tried Worked: Yes  Guideline(s) Used:  Back Pain  Disposition Per Guideline:   See Today or Tomorrow in Office  Reason For Disposition Reached:   Patient wants to be seen  Advice Given:  Reassurance:  Twisting or heavy lifting can cause back pain.  With treatment, the pain most often goes away in 1-2 weeks.  You can treat most back pain at home.  Here is some care advice that should help.  Cold or Heat:  Cold Pack: For pain or swelling, use a cold pack or ice wrapped in a wet cloth. Put it on the sore area for 20 minutes. Repeat 4 times on the first day, then as needed.  Heat Pack: If pain lasts over 2 days, apply heat to the sore area. Use a heat pack, heating pad, or warm wet washcloth. Do this for 10 minutes, then as needed. For widespread stiffness, take a hot bath or hot shower instead. Move the sore area under the warm water.  Sleep:  Sleep on your side with a pillow between your knees. If you sleep on your back, put a pillow under your knees.  Avoid sleeping on your stomach.  Your mattress should be firm. Avoid waterbeds.  Activity  Keep doing your day-to-day activities if it is not too painful. Staying active is better than resting.  Avoid anything that makes your pain worse. Avoid heavy lifting, twisting, and  too much exercise until your back heals.  Pain Medicines:  For pain relief, take acetaminophen, ibuprofen, or naproxen.  Ibuprofen (e.g., Motrin, Advil):  Another choice is to take 600 mg (three 200 mg pills) by mouth every 8 hours.  The most you should take each day is 1,200 mg (six 200 mg pills), unless your doctor has told you to take more.  Call Back If:  Numbness or weakness occur  Bowel/bladder problems occur  Pain lasts for more than 2 weeks  You become worse.  Appointment Scheduled:  05/18/2012 14:45:00 Appointment Scheduled Provider:  Eustaquio Boyden [facility])

## 2012-05-18 ENCOUNTER — Encounter: Payer: Self-pay | Admitting: Family Medicine

## 2012-05-18 ENCOUNTER — Ambulatory Visit (INDEPENDENT_AMBULATORY_CARE_PROVIDER_SITE_OTHER): Payer: 59 | Admitting: Family Medicine

## 2012-05-18 VITALS — BP 110/50 | HR 58 | Temp 98.4°F | Wt 177.0 lb

## 2012-05-18 DIAGNOSIS — M546 Pain in thoracic spine: Secondary | ICD-10-CM

## 2012-05-18 DIAGNOSIS — M5134 Other intervertebral disc degeneration, thoracic region: Secondary | ICD-10-CM | POA: Insufficient documentation

## 2012-05-18 DIAGNOSIS — R109 Unspecified abdominal pain: Secondary | ICD-10-CM | POA: Insufficient documentation

## 2012-05-18 MED ORDER — CYCLOBENZAPRINE HCL 10 MG PO TABS: 10.0000 mg | ORAL_TABLET | Freq: Two times a day (BID) | ORAL | Status: DC | PRN

## 2012-05-18 MED ORDER — NAPROXEN 500 MG PO TABS: ORAL_TABLET | ORAL | Status: DC

## 2012-05-18 NOTE — Patient Instructions (Signed)
Blood work today. I do think this is likely musculoskeletal - treat with naprosyn and flexeril (may start with 1/2 pill flexeril so it doesn't make you sleepy). Continue ice/heat, stretching. If not improving with this, let me know for imaging.

## 2012-05-18 NOTE — Assessment & Plan Note (Signed)
anticipate MSK ie thoracic strain - treat with NSAID and flexeril. Update if not better next week with meds - would obtain thoracic xray and consider abd [place] (see above)

## 2012-05-18 NOTE — Assessment & Plan Note (Signed)
Anticipate radiation from thoracic spine - but given h/o dm and recent start of metformin will check blood work today to r/o other causes. If not better, consider abd [place] if thoracic spine xray unrevealing.

## 2012-05-18 NOTE — Progress Notes (Signed)
  Subjective:    Patient ID: Betty Sanders, female    DOB: 1946-03-21, 67 y.o.   MRN: [id number]  HPI CC: back/abd pain  Progressively getting worse.  Present since beginning of year.  Intermittent, comes and goes.  Now starting to affect abdomen.  Describes pain at around T9.  Worse at night.  No crescendo pain.    Last weekend working - exerted herself.  During exercise, felt fine, but when got home significantly worse pain.  So far has tried muscle stimulator on back, massage chair.  Has also tried ibuprofen which temporarily helps (takes 400mg  daily).  Heat helps.  Oxycodone left over really helped but then pain returned again. Lab Results  Component Value Date   CREATININE 0.7 02/23/2012   Recently started on metformin 2/2 new dx DM.  Denies inciting trauma/injury.  No smoking.  No fevers/chills, nausea/vomiting, indigestion, heartburn, diarrhea, constipation, cough, SOB.  Past Medical History  Diagnosis Date  . 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 Mayford Knife)  . Type 2 diabetes mellitus, uncontrolled 02/2012    declines DSME  . Dyslipidemia     Review of Systems Per HPI    Objective:   Physical Exam  Nursing note and vitals reviewed. Constitutional: She appears well-developed and well-nourished. No distress.  HENT:  Head: Normocephalic and atraumatic.  Mouth/Throat: Oropharynx is clear and moist. No oropharyngeal exudate.  Eyes: Conjunctivae normal and EOM are normal. Pupils are equal, round, and reactive to light.  Cardiovascular: Normal rate, regular rhythm, normal heart sounds and intact distal pulses.   No murmur heard. Pulmonary/Chest: Effort normal and breath sounds normal. No respiratory distress. She has no wheezes. She has no rales.  Abdominal: Soft. Bowel sounds are normal. She exhibits no distension and no mass. There is no hepatosplenomegaly. There is tenderness (mild discomfort RUQ) in the right upper quadrant. There is no  rigidity, no rebound, no guarding, no CVA tenderness and negative Murphy's sign.  Musculoskeletal: She exhibits no edema.       Tender to palpation midline and at right paraspinous thoracic area just lateral to midline. Neg SLR bilaterally No pain with int/external rotation at hips.      Assessment & Plan:

## 2012-05-19 LAB — LIPASE: Lipase: 38 U/L (ref 11.0–59.0)

## 2012-05-19 LAB — CBC WITH DIFFERENTIAL/PLATELET
Eosinophils Relative: 1.5 % (ref 0.0–5.0)
HCT: 35 % — ABNORMAL LOW (ref 36.0–46.0)
Hemoglobin: 12.1 g/dL (ref 12.0–15.0)
Lymphs Abs: 3 10*3/uL (ref 0.7–4.0)
MCV: 79.6 fl (ref 78.0–100.0)
Monocytes Absolute: 0.4 10*3/uL (ref 0.1–1.0)
Neutro Abs: 5.1 10*3/uL (ref 1.4–7.7)
Platelets: 331 10*3/uL (ref 150.0–400.0)
RDW: 13.6 % (ref 11.5–14.6)

## 2012-05-19 LAB — COMPREHENSIVE METABOLIC PANEL
ALT: 20 U/L (ref 0–35)
Alkaline Phosphatase: 69 U/L (ref 39–117)
Sodium: 137 mEq/L (ref 135–145)
Total Bilirubin: 0.5 mg/dL (ref 0.3–1.2)
Total Protein: 7.4 g/dL (ref 6.0–8.3)

## 2012-07-31 ENCOUNTER — Other Ambulatory Visit: Payer: Self-pay | Admitting: Family Medicine

## 2012-07-31 DIAGNOSIS — E785 Hyperlipidemia, unspecified: Secondary | ICD-10-CM

## 2012-07-31 DIAGNOSIS — E1165 Type 2 diabetes mellitus with hyperglycemia: Secondary | ICD-10-CM

## 2012-07-31 DIAGNOSIS — R109 Unspecified abdominal pain: Secondary | ICD-10-CM

## 2012-08-02 ENCOUNTER — Other Ambulatory Visit: Payer: 59

## 2012-08-03 ENCOUNTER — Other Ambulatory Visit (INDEPENDENT_AMBULATORY_CARE_PROVIDER_SITE_OTHER): Payer: 59

## 2012-08-03 DIAGNOSIS — E1165 Type 2 diabetes mellitus with hyperglycemia: Secondary | ICD-10-CM

## 2012-08-03 DIAGNOSIS — IMO0001 Reserved for inherently not codable concepts without codable children: Secondary | ICD-10-CM

## 2012-08-03 DIAGNOSIS — E785 Hyperlipidemia, unspecified: Secondary | ICD-10-CM

## 2012-08-03 LAB — BASIC METABOLIC PANEL
BUN: 12 mg/dL (ref 6–23)
CO2: 28 mEq/L (ref 19–32)
Calcium: 9.2 mg/dL (ref 8.4–10.5)
Creatinine, Ser: 0.6 mg/dL (ref 0.4–1.2)
Glucose, Bld: 197 mg/dL — ABNORMAL HIGH (ref 70–99)

## 2012-08-03 LAB — LIPID PANEL
Cholesterol: 229 mg/dL — ABNORMAL HIGH (ref 0–200)
Total CHOL/HDL Ratio: 6
Triglycerides: 267 mg/dL — ABNORMAL HIGH (ref 0.0–149.0)

## 2012-08-09 ENCOUNTER — Encounter: Payer: Self-pay | Admitting: Family Medicine

## 2012-08-09 ENCOUNTER — Ambulatory Visit (INDEPENDENT_AMBULATORY_CARE_PROVIDER_SITE_OTHER): Payer: 59 | Admitting: Family Medicine

## 2012-08-09 VITALS — BP 110/70 | HR 68 | Temp 98.1°F | Wt 182.0 lb

## 2012-08-09 DIAGNOSIS — E1165 Type 2 diabetes mellitus with hyperglycemia: Secondary | ICD-10-CM

## 2012-08-09 DIAGNOSIS — E785 Hyperlipidemia, unspecified: Secondary | ICD-10-CM

## 2012-08-09 LAB — HM DIABETES FOOT EXAM

## 2012-08-09 MED ORDER — ATORVASTATIN CALCIUM 40 MG PO TABS: 40.0000 mg | ORAL_TABLET | Freq: Every day | ORAL | Status: DC

## 2012-08-09 MED ORDER — METFORMIN HCL ER (OSM) 1000 MG PO TB24: 2000.0000 mg | ORAL_TABLET | Freq: Every day | ORAL | Status: DC

## 2012-08-09 NOTE — Assessment & Plan Note (Signed)
Chronic.  Improved but remains uncontrolled. Not tolerating metformin 1000mg  bid. Suggested she start extended release metformin 1000mg  daily for 1 week then increase to 2000mg  daily.  If not tolerating, will recommend combo med. rtc 3 mo for f/u. Also encouraged regular exercise and healthy diet to help control diabetes.

## 2012-08-09 NOTE — Assessment & Plan Note (Signed)
Reviewed #s, did not improve with TLC. Start lipitor 40mg  daily.  discussed side effects to monitor. Update [place] if concerns prior to next appointment.

## 2012-08-09 NOTE — Patient Instructions (Signed)
Stop regular metformin. Start metformin ER - one 1000mg  pill daily for 1 week then increase to two 1000mg  pills daily.  Call me if trouble with this change. Schedule vision exam as you're due. Start lipitor (atorvastatin) 40mg  daily for cholesterol

## 2012-08-09 NOTE — Progress Notes (Addendum)
  Subjective:    Patient ID: Betty Sanders, female    DOB: Nov 30, 1945, 67 y.o.   MRN: [id number]  HPI CC: f/u DM  Betty Sanders presents today as folow up for recently diagnosed diabetes.  Planning [place]ney vacation.  Using glucose buddy and fitness buddy.  Not exercising as much any more.  Has not been compliant with diet.  DM - has missed several evening doses of metformin (1x/wk).  When was on metformin 500mg  bid, tolerated much better.  Checks sugars every other day.  Running 150 fating.  Due for eye exam.  No paresthesias.  No hypoglycemia.  Occasional myalgias already  HLD - elevated last few measures.    Past Medical History  Diagnosis Date  . 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 Mayford Knife)  . Type 2 diabetes mellitus, uncontrolled 02/2012    declines DSME  . Dyslipidemia      Review of Systems Per HPI    Objective:   Physical Exam  Nursing note and vitals reviewed. Constitutional: She appears well-developed and well-nourished. No distress.  HENT:  Head: Normocephalic and atraumatic.  Nose: Nose normal.  Mouth/Throat: Oropharynx is clear and moist. No oropharyngeal exudate.  Eyes: Conjunctivae and EOM are normal. Pupils are equal, round, and reactive to light. No scleral icterus.  Neck: Normal range of motion. Neck supple. Carotid bruit is not present.  Cardiovascular: Normal rate, regular rhythm and intact distal pulses.   Murmur (2/6 SEM) heard. Pulmonary/Chest: Effort normal and breath sounds normal. No respiratory distress. She has no wheezes. She has no rales.  Musculoskeletal: She exhibits no edema.  Diabetic foot exam: Normal inspection No skin breakdown No calluses  Normal DP/PT pulses Normal sensation to light touch and monofilament Nails normal  Lymphadenopathy:    She has no cervical adenopathy.  Skin: Skin is warm and dry. No rash noted.  Psychiatric: She has a normal mood and affect.       Assessment & Plan:

## 2012-08-14 ENCOUNTER — Telehealth: Payer: Self-pay

## 2012-08-14 NOTE — Telephone Encounter (Signed)
Pt left v/m to clarify and confirm how to take Metformin ER 1000 mg. Dr Reece Agar changed on 08/09/12;  Pt thought was to take metformin ER 1000 mg daily for one week and then increase to Metformin ER 2000 mg daily. Pt said when picked up Metformin Rx instructions were to take 4 daily.Please advise.

## 2012-08-15 NOTE — Telephone Encounter (Signed)
Advised patient that she should've gotten a new prescription of 1000 mg tablets and she should be taking 2 daily. She said she didn't have the bottle with her and now wasn't sure exactly what she had. She said she will look when she gets home and call me back for clarification.

## 2012-10-26 ENCOUNTER — Other Ambulatory Visit: Payer: Self-pay

## 2012-10-31 ENCOUNTER — Other Ambulatory Visit: Payer: Self-pay | Admitting: Family Medicine

## 2012-10-31 DIAGNOSIS — E785 Hyperlipidemia, unspecified: Secondary | ICD-10-CM

## 2012-10-31 DIAGNOSIS — IMO0002 Reserved for concepts with insufficient information to code with codable children: Secondary | ICD-10-CM

## 2012-10-31 DIAGNOSIS — E1165 Type 2 diabetes mellitus with hyperglycemia: Secondary | ICD-10-CM

## 2012-11-01 ENCOUNTER — Other Ambulatory Visit (INDEPENDENT_AMBULATORY_CARE_PROVIDER_SITE_OTHER): Payer: 59

## 2012-11-01 DIAGNOSIS — E785 Hyperlipidemia, unspecified: Secondary | ICD-10-CM

## 2012-11-01 DIAGNOSIS — IMO0002 Reserved for concepts with insufficient information to code with codable children: Secondary | ICD-10-CM

## 2012-11-01 DIAGNOSIS — Z0000 Encounter for general adult medical examination without abnormal findings: Secondary | ICD-10-CM

## 2012-11-01 DIAGNOSIS — E1165 Type 2 diabetes mellitus with hyperglycemia: Secondary | ICD-10-CM

## 2012-11-01 LAB — HEMOGLOBIN A1C: Hgb A1c MFr Bld: 9 % — ABNORMAL HIGH (ref 4.6–6.5)

## 2012-11-01 LAB — COMPREHENSIVE METABOLIC PANEL
Albumin: 3.9 g/dL (ref 3.5–5.2)
CO2: 27 mEq/L (ref 19–32)
GFR: 90.79 mL/min (ref >60.00)
Glucose, Bld: 222 mg/dL — ABNORMAL HIGH (ref 70–99)
Potassium: 4.2 mEq/L (ref 3.5–5.1)
Sodium: 136 mEq/L (ref 135–145)
Total Protein: 7.2 g/dL (ref 6.0–8.3)

## 2012-11-08 ENCOUNTER — Encounter: Payer: Self-pay | Admitting: Family Medicine

## 2012-11-08 ENCOUNTER — Ambulatory Visit (INDEPENDENT_AMBULATORY_CARE_PROVIDER_SITE_OTHER): Payer: 59 | Admitting: Family Medicine

## 2012-11-08 VITALS — BP 124/76 | HR 72 | Temp 98.1°F | Wt 186.8 lb

## 2012-11-08 DIAGNOSIS — E785 Hyperlipidemia, unspecified: Secondary | ICD-10-CM

## 2012-11-08 DIAGNOSIS — E1165 Type 2 diabetes mellitus with hyperglycemia: Secondary | ICD-10-CM

## 2012-11-08 MED ORDER — SITAGLIPTIN PHOSPHATE 100 MG PO TABS: 100.0000 mg | ORAL_TABLET | Freq: Every day | ORAL | Status: DC

## 2012-11-08 MED ORDER — METFORMIN HCL ER (OSM) 1000 MG PO TB24: 1000.0000 mg | ORAL_TABLET | Freq: Two times a day (BID) | ORAL | Status: DC

## 2012-11-08 NOTE — Progress Notes (Signed)
  Subjective:    Patient ID: Betty Sanders, female    DOB: 1945/07/24, 67 y.o.   MRN: [id number]  HPI CC: 3 mo f/u  Lab Results  Component Value Date   HGBA1C 9.0* 11/01/2012  DM - foot exam done 07/2012.  Checks fasting sugars intermittently - recently running 190s.  Not compliant with diabetic diet.  Has eye exam scheduled for 2 weeks.  No paresthesias.  HLD - last visit lipitor started.  Stopped 3 weeks ago - caused constant accidents.  Started lipitor and metformin extended release together - causing diarrhea with accidents and bloating.  Decided to stop lipitor  Wt Readings from Last 3 Encounters:  11/08/12 186 lb 12 oz (84.709 kg)  08/09/12 182 lb (82.555 kg)  05/18/12 177 lb (80.287 kg)  not adhering to diabetic diet.  Not exercising.  Motivated to restart healthy activity regimen.  Has bought stationary bicycle.  Prior was walking 3 mi/day.   Past Medical History  Diagnosis Date  . 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 Mayford Knife)  . Type 2 diabetes mellitus, uncontrolled 02/2012    declines DSME  . Dyslipidemia     Review of Systems Per HPI    Objective:   Physical Exam  Nursing note and vitals reviewed. Constitutional: She appears well-developed and well-nourished. No distress.  HENT:  Mouth/Throat: Oropharynx is clear and moist. No oropharyngeal exudate.  Cardiovascular: Normal rate, regular rhythm and intact distal pulses.   Murmur (3/6 SEM at LUSB) heard. Pulmonary/Chest: Effort normal and breath sounds normal. No respiratory distress. She has no wheezes. She has no rales.  Musculoskeletal: She exhibits no edema.  Skin: Skin is warm and dry. No rash noted.       Assessment & Plan:

## 2012-11-08 NOTE — Assessment & Plan Note (Addendum)
Chronic, uncontrolled as evidenced by A1c increase to 9%. Motivated to restart healthy diet and lifestyle changes. Change to metformin ER 1000mg  bid and add on januvia. rtc 3 mo f/u. Has optho appt scheduled in 2 weeks.  Foot exam up to date. Pneumovax next visit.

## 2012-11-08 NOTE — Assessment & Plan Note (Signed)
Did not tolerate statin 2/2 presumed bloating and diarrhea. Consider trial in future, will hold for now.

## 2012-11-08 NOTE — Patient Instructions (Signed)
Let's change metformin to 1000mg  twice daily. Let's start januvia at 50mg  daily - take 1/2 tablet daily for 2 weeks, if tolerating after may increase to 100mg  daily. May continue to hold lipitor for now - may restart in future (trial). Return to see me in 3 months for follow up

## 2013-02-07 ENCOUNTER — Ambulatory Visit: Payer: 59 | Admitting: Family Medicine

## 2013-02-14 ENCOUNTER — Ambulatory Visit (INDEPENDENT_AMBULATORY_CARE_PROVIDER_SITE_OTHER): Payer: 59 | Admitting: Family Medicine

## 2013-02-14 ENCOUNTER — Encounter: Payer: Self-pay | Admitting: Family Medicine

## 2013-02-14 VITALS — BP 116/82 | HR 72 | Temp 98.1°F | Wt 187.2 lb

## 2013-02-14 DIAGNOSIS — E1165 Type 2 diabetes mellitus with hyperglycemia: Secondary | ICD-10-CM

## 2013-02-14 DIAGNOSIS — E785 Hyperlipidemia, unspecified: Secondary | ICD-10-CM

## 2013-02-14 DIAGNOSIS — Z1231 Encounter for screening mammogram for malignant neoplasm of breast: Secondary | ICD-10-CM

## 2013-02-14 DIAGNOSIS — Z23 Encounter for immunization: Secondary | ICD-10-CM

## 2013-02-14 LAB — BASIC METABOLIC PANEL
BUN: 12 mg/dL (ref 6–23)
CO2: 25 mEq/L (ref 19–32)
Chloride: 102 mEq/L (ref 96–112)
Creatinine, Ser: 0.7 mg/dL (ref 0.4–1.2)
Glucose, Bld: 206 mg/dL — ABNORMAL HIGH (ref 70–99)
Potassium: 3.7 mEq/L (ref 3.5–5.1)

## 2013-02-14 LAB — HEMOGLOBIN A1C: Hgb A1c MFr Bld: 8 % — ABNORMAL HIGH (ref 4.6–6.5)

## 2013-02-14 NOTE — Assessment & Plan Note (Signed)
Recheck FLP next visit - hopeful for improvement with better sugar control. If remaining elevated, start statin.

## 2013-02-14 NOTE — Assessment & Plan Note (Signed)
Chronic. Check A1c today. Complaint with metformin 1000mg  xr bid and januvia 100mg  daily. If persistently high, consider sulfonylurea. rec schedule ophtho appointment Pneumovax today.  Foot exam today.

## 2013-02-14 NOTE — Patient Instructions (Addendum)
Schedule eye exam. Pneumonia shot today. Mammogram ordered today.  Last done 03/08/2012.  Let us know if you cannot schedule on your own at [facility] Blood work today. Good to see you today, call us with questions.

## 2013-02-14 NOTE — Progress Notes (Signed)
  Subjective:    Patient ID: Betty Sanders, female    DOB: Oct 22, 1945, 67 y.o.   MRN: [id number]  HPI CC: f/u DM  Betty Sanders presents today as 3 mo f/u DM.  DM - declined Diabetic education.  Checks sugars once a week.  Last checked 2d ago fasting 167.  No low sugar sxs.  No eye exam recently. Lab Results  Component Value Date   HGBA1C 9.0* 11/01/2012    HLD - intolerant to lipitor in past (although may have been combination of metformin/statin initiation.    Occasional leg pain - describes migrating knee pains worse at night.  Improve with getting up and moving.  Occasional leg cramps.   Lab Results  Component Value Date   CKTOTAL 105 05/18/2012   More active recently.  [place]  Raking leaves, kayaking. Wt Readings from Last 3 Encounters:  02/14/13 187 lb 4 oz (84.936 kg)  11/08/12 186 lb 12 oz (84.709 kg)  08/09/12 182 lb (82.555 kg)  Body mass index is 31.89 kg/(m^2).   Past Medical History  Diagnosis Date  . 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 Mayford Knife)  . Type 2 diabetes mellitus, uncontrolled 02/2012    declines DSME  . Dyslipidemia      Review of Systems Per HPI    Objective:   Physical Exam  Nursing note and vitals reviewed. Constitutional: She appears well-developed and well-nourished. No distress.  HENT:  Head: Normocephalic and atraumatic.  Mouth/Throat: Oropharynx is clear and moist. No oropharyngeal exudate.  Eyes: Conjunctivae and EOM are normal. Pupils are equal, round, and reactive to light. No scleral icterus.  Neck: Normal range of motion. Neck supple.  Cardiovascular: Normal rate, regular rhythm and intact distal pulses.   Murmur (2/6 SEM best at LUSB) heard. Pulmonary/Chest: Effort normal and breath sounds normal. No respiratory distress. She has no wheezes. She has no rales.  Musculoskeletal: She exhibits no edema.  Diabetic foot exam: Resolving pustular pruritic rash on soles No skin  breakdown Calluses on soles Normal DP/PT pulses Normal sensation to light touch and slightly decreased to monofilament Nails normal  Lymphadenopathy:    She has no cervical adenopathy.  Skin: Skin is warm and dry. No rash noted.  Psychiatric: She has a normal mood and affect.       Assessment & Plan:

## 2013-02-17 ENCOUNTER — Other Ambulatory Visit: Payer: Self-pay | Admitting: Family Medicine

## 2013-02-17 MED ORDER — GLIMEPIRIDE 1 MG PO TABS: 2.0000 mg | ORAL_TABLET | Freq: Every day | ORAL | Status: DC

## 2013-03-22 ENCOUNTER — Encounter: Payer: Self-pay | Admitting: Family Medicine

## 2013-03-22 LAB — HM MAMMOGRAPHY: HM Mammogram: NORMAL

## 2013-03-23 ENCOUNTER — Encounter: Payer: Self-pay | Admitting: *Deleted

## 2013-05-06 ENCOUNTER — Other Ambulatory Visit: Payer: Self-pay | Admitting: Family Medicine

## 2013-05-06 DIAGNOSIS — IMO0002 Reserved for concepts with insufficient information to code with codable children: Secondary | ICD-10-CM

## 2013-05-06 DIAGNOSIS — E1165 Type 2 diabetes mellitus with hyperglycemia: Secondary | ICD-10-CM

## 2013-05-06 DIAGNOSIS — E785 Hyperlipidemia, unspecified: Secondary | ICD-10-CM

## 2013-05-09 ENCOUNTER — Other Ambulatory Visit (INDEPENDENT_AMBULATORY_CARE_PROVIDER_SITE_OTHER): Payer: 59

## 2013-05-09 DIAGNOSIS — IMO0002 Reserved for concepts with insufficient information to code with codable children: Secondary | ICD-10-CM

## 2013-05-09 DIAGNOSIS — E1165 Type 2 diabetes mellitus with hyperglycemia: Secondary | ICD-10-CM

## 2013-05-09 DIAGNOSIS — IMO0001 Reserved for inherently not codable concepts without codable children: Secondary | ICD-10-CM

## 2013-05-09 DIAGNOSIS — E785 Hyperlipidemia, unspecified: Secondary | ICD-10-CM

## 2013-05-09 LAB — LIPID PANEL
CHOL/HDL RATIO: 5
Cholesterol: 177 mg/dL (ref 0–200)
HDL: 36.6 mg/dL — ABNORMAL LOW (ref >39.00)
LDL Cholesterol: 114 mg/dL — ABNORMAL HIGH (ref 0–99)
Triglycerides: 134 mg/dL (ref 0.0–149.0)
VLDL: 26.8 mg/dL (ref 0.0–40.0)

## 2013-05-09 LAB — BASIC METABOLIC PANEL
BUN: 15 mg/dL (ref 6–23)
CHLORIDE: 105 meq/L (ref 96–112)
CO2: 26 mEq/L (ref 19–32)
Calcium: 9.3 mg/dL (ref 8.4–10.5)
Creatinine, Ser: 0.8 mg/dL (ref 0.4–1.2)
GFR: 74.27 mL/min (ref >60.00)
Glucose, Bld: 116 mg/dL — ABNORMAL HIGH (ref 70–99)
Potassium: 4 mEq/L (ref 3.5–5.1)
Sodium: 137 mEq/L (ref 135–145)

## 2013-05-11 LAB — HM DIABETES EYE EXAM: HM Diabetic Eye Exam: NORMAL

## 2013-05-16 ENCOUNTER — Encounter: Payer: Self-pay | Admitting: Family Medicine

## 2013-05-16 ENCOUNTER — Other Ambulatory Visit (HOSPITAL_COMMUNITY)
Admission: RE | Admit: 2013-05-16 | Discharge: 2013-05-16 | Disposition: A | Discharge status: Home / Self Care | Payer: 59 | Source: Ambulatory Visit | Attending: Family Medicine | Admitting: Family Medicine

## 2013-05-16 ENCOUNTER — Ambulatory Visit (INDEPENDENT_AMBULATORY_CARE_PROVIDER_SITE_OTHER): Payer: 59 | Admitting: Family Medicine

## 2013-05-16 VITALS — BP 126/80 | HR 64 | Temp 98.1°F | Ht 64.25 in | Wt 182.0 lb

## 2013-05-16 DIAGNOSIS — Z01419 Encounter for gynecological examination (general) (routine) without abnormal findings: Secondary | ICD-10-CM | POA: Insufficient documentation

## 2013-05-16 DIAGNOSIS — Z1151 Encounter for screening for human papillomavirus (HPV): Secondary | ICD-10-CM | POA: Insufficient documentation

## 2013-05-16 DIAGNOSIS — E1165 Type 2 diabetes mellitus with hyperglycemia: Secondary | ICD-10-CM

## 2013-05-16 DIAGNOSIS — E785 Hyperlipidemia, unspecified: Secondary | ICD-10-CM

## 2013-05-16 DIAGNOSIS — IMO0002 Reserved for concepts with insufficient information to code with codable children: Secondary | ICD-10-CM

## 2013-05-16 DIAGNOSIS — IMO0001 Reserved for inherently not codable concepts without codable children: Secondary | ICD-10-CM

## 2013-05-16 DIAGNOSIS — Z0000 Encounter for general adult medical examination without abnormal findings: Secondary | ICD-10-CM

## 2013-05-16 NOTE — Assessment & Plan Note (Addendum)
Preventative protocols reviewed and updated unless pt declined. Discussed healthy diet and lifestyle.  breast exam and pap smear performed today.

## 2013-05-16 NOTE — Progress Notes (Signed)
Subjective:    Patient ID: Betty Sanders, female    DOB: [birth year], 68 y.o.   MRN: [id number]  HPI CC: CPE  DM - improving sugar control based on log she brings.  Compliant with amaryl 68m daily, januvia 1027mdaily and fortamet 100043mid. Lab Results  Component Value Date   HGBA1C 8.0* 02/14/2013   Wt Readings from Last 3 Encounters:  05/16/13 182 lb (82.555 kg)  02/14/13 187 lb 4 oz (84.936 kg)  11/08/12 186 lb 12 oz (84.709 kg)  Body mass index is 31 kg/(m^2).  No more abd pain, no more leg pain.  Preventative:  Colonoscopy 02/2010, Dr. GesCarlean Purlec rpt 5 yrs given polyps Mammogram 03/2013, Birads 1 .  Well woman exam - always normal.  Flu shot - 01/2013 Td 2009  Pneumovax 01/2013 Shingles shot - will check with insurance  Denies depression, sadness, anhedonia.   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  Past histories reviewed and updated if relevant as below. Patient Active Problem List   Diagnosis Date Noted  . Abdominal  pain, other specified site 05/18/2012  . Thoracic back pain 05/18/2012  . Dyslipidemia   . Healthcare maintenance 02/23/2012  . Type 2 diabetes mellitus, uncontrolled 02/18/2012   Past Medical History  Diagnosis Date  . 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 WilJimmye Norman. Type 2 diabetes mellitus, uncontrolled 02/2012    declines DSME  . Dyslipidemia    Past Surgical History  Procedure Laterality Date  . Rhinoplasty  1970's  . Ganglion cyst excision  2006    Left wrist  . Colonoscopy  11/11    tubular adenoma polyp x 1; repeat 5 years--Dr. GesCarlean PurlHistory  Substance Use Topics  . Smoking status: Former Smoker    Quit date:  04/19/1968  . Smokeless tobacco: Never Used  . Alcohol Use: Yes     Comment: Rare   Family History  Problem Relation Age of Onset  . Lung cancer Father   . Colon cancer Cousin   . Breast cancer      Aunt  . Cervical cancer      Aunt  . Diabetes Maternal Grandfather   . Stroke Paternal Grandfather   . CAD Neg Hx    Allergies  Allergen Reactions  . Sulfa Antibiotics     Possible rxn, unsure.   Current Outpatient Prescriptions on File Prior to Visit  Medication Sig Dispense Refill  . Blood Glucose Monitoring Suppl (ONE TOUCH ULTRA SYSTEM KIT) W/DEVICE KIT 1 kit by Does not apply route once.  1 each  0  . glimepiride (AMARYL) 1 MG tablet Take 2 tablets (2 mg total) by mouth daily before breakfast.  60 tablet  11  . glucose blood test strip 250.02, check sugars daily.  Use as instructed  100 each  12  . metformin (FORTAMET) 1000 MG (OSM) 24 hr tablet Take 1 tablet (1,000 mg total) by mouth 2 (two) times daily with a meal.  60 tablet  11  . sitaGLIPtin (JANUVIA) 100 MG tablet Take 1 tablet (100 mg total) by mouth daily.  30 tablet  11  . vitamin C (ASCORBIC ACID) 500 MG tablet Take 500 mg  by mouth as needed.      Marland Kitchen aspirin (ASPIRIN EC) 81 MG EC tablet Take 81 mg by mouth daily. Swallow whole.       No current facility-administered medications on file prior to visit.     Review of Systems  Constitutional: Negative for fever, chills, activity change, appetite change, fatigue and unexpected weight change.  HENT: Negative for hearing loss.   Eyes: Negative for visual disturbance.  Respiratory: Negative for cough, chest tightness, shortness of breath and wheezing.   Cardiovascular: Negative for chest pain, palpitations and leg swelling.  Gastrointestinal: Negative for nausea, vomiting, abdominal pain, diarrhea, constipation, blood in stool and abdominal distention.  Genitourinary: Negative for hematuria and difficulty urinating.  Musculoskeletal: Negative for arthralgias, myalgias and  neck pain.  Skin: Negative for rash.  Neurological: Negative for dizziness, seizures, syncope and headaches.  Hematological: Negative for adenopathy. Does not bruise/bleed easily.  Psychiatric/Behavioral: Negative for dysphoric mood. The patient is not nervous/anxious.        Objective:  Physical Exam  Nursing note and vitals reviewed. Constitutional: She is oriented to person, place, and time. She appears well-developed and well-nourished. No distress.  HENT:  Head: Normocephalic and atraumatic.  Right Ear: External ear normal.  Left Ear: External ear normal.  Nose: Nose normal.  Mouth/Throat: Oropharynx is clear and moist. No oropharyngeal exudate.  Eyes: Conjunctivae and EOM are normal. Pupils are equal, round, and reactive to light. No scleral icterus.  Neck: Normal range of motion. Neck supple. Carotid bruit is not present. No thyromegaly present.  Cardiovascular: Normal rate, regular rhythm and intact distal pulses.   Murmur (faint SEM) heard. Pulses:      Radial pulses are 2+ on the right side, and 2+ on the left side.  Pulmonary/Chest: Effort normal and breath sounds normal. No respiratory distress. She has no wheezes. She has no rales. Right breast exhibits no inverted nipple, no mass, no nipple discharge, no skin change and no tenderness. Left breast exhibits no inverted nipple, no mass, no nipple discharge, no skin change and no tenderness.  Abdominal: Soft. Bowel sounds are normal. She exhibits no distension and no mass. There is no tenderness. There is no rebound and no guarding.  Genitourinary: Vagina normal and uterus normal. Pelvic exam was performed with patient supine. There is no rash, tenderness, lesion or injury on the right labia. There is no rash, tenderness, lesion or injury on the left labia. Cervix exhibits no motion tenderness, no discharge and no friability. Right adnexum displays no mass, no tenderness and no fullness. Left adnexum displays no mass, no  tenderness and no fullness.  Musculoskeletal: Normal range of motion. She exhibits no edema.  Lymphadenopathy:    She has no cervical adenopathy.    She has no axillary adenopathy.       Right axillary: No lateral adenopathy present.       Left axillary: No lateral adenopathy present.      Right: No supraclavicular adenopathy present.       Left: No supraclavicular adenopathy present.  Neurological: She is alert and oriented to person, place, and time.  CN grossly intact, station and gait intact  Skin: Skin is warm and dry. No rash noted.  Psychiatric: She has a normal mood and affect. Her behavior is normal. Judgment and thought content normal.       Assessment & Plan:

## 2013-05-16 NOTE — Progress Notes (Signed)
Pre-visit discussion using our clinic review tool. No additional management support is needed unless otherwise documented below in the visit note.  

## 2013-05-16 NOTE — Assessment & Plan Note (Signed)
congratulated on improved control Recheck in 3-4 months for follow up.

## 2013-05-16 NOTE — Assessment & Plan Note (Signed)
Reviewed #s - encouraged continued TLC

## 2013-05-16 NOTE — Addendum Note (Signed)
Addended by: Royann Shivers A on: 05/16/2013 10:19 AM   Modules accepted: Orders

## 2013-05-16 NOTE — Patient Instructions (Signed)
Good to see you today, call us with questions. Return in 4 months for diabetes follow up [place] job with sugars!!

## 2013-09-12 ENCOUNTER — Ambulatory Visit: Payer: 59 | Admitting: Family Medicine

## 2013-10-07 ENCOUNTER — Other Ambulatory Visit: Payer: Self-pay | Admitting: Family Medicine

## 2013-10-07 DIAGNOSIS — E1165 Type 2 diabetes mellitus with hyperglycemia: Secondary | ICD-10-CM

## 2013-10-07 DIAGNOSIS — IMO0002 Reserved for concepts with insufficient information to code with codable children: Secondary | ICD-10-CM

## 2013-10-09 ENCOUNTER — Other Ambulatory Visit (INDEPENDENT_AMBULATORY_CARE_PROVIDER_SITE_OTHER): Payer: 59

## 2013-10-09 DIAGNOSIS — E1165 Type 2 diabetes mellitus with hyperglycemia: Secondary | ICD-10-CM

## 2013-10-09 DIAGNOSIS — IMO0001 Reserved for inherently not codable concepts without codable children: Secondary | ICD-10-CM

## 2013-10-09 DIAGNOSIS — IMO0002 Reserved for concepts with insufficient information to code with codable children: Secondary | ICD-10-CM

## 2013-10-09 LAB — BASIC METABOLIC PANEL
BUN: 10 mg/dL (ref 6–23)
CHLORIDE: 103 meq/L (ref 96–112)
CO2: 27 mEq/L (ref 19–32)
Calcium: 9.2 mg/dL (ref 8.4–10.5)
Creatinine, Ser: 0.7 mg/dL (ref 0.4–1.2)
GFR: 92.06 mL/min (ref >60.00)
GLUCOSE: 244 mg/dL — AB (ref 70–99)
POTASSIUM: 4 meq/L (ref 3.5–5.1)
Sodium: 137 mEq/L (ref 135–145)

## 2013-10-09 LAB — MICROALBUMIN / CREATININE URINE RATIO
Creatinine,U: 121.4 mg/dL
Microalb Creat Ratio: 0.2 mg/g (ref 0.0–30.0)
Microalb, Ur: 0.3 mg/dL (ref 0.0–1.9)

## 2013-10-09 LAB — HEMOGLOBIN A1C: Hgb A1c MFr Bld: 8 % — ABNORMAL HIGH (ref 4.6–6.5)

## 2013-10-16 ENCOUNTER — Encounter: Payer: Self-pay | Admitting: Family Medicine

## 2013-10-16 ENCOUNTER — Ambulatory Visit (INDEPENDENT_AMBULATORY_CARE_PROVIDER_SITE_OTHER): Payer: 59 | Admitting: Family Medicine

## 2013-10-16 VITALS — BP 122/64 | HR 68 | Temp 98.1°F | Wt 194.8 lb

## 2013-10-16 DIAGNOSIS — E1165 Type 2 diabetes mellitus with hyperglycemia: Secondary | ICD-10-CM

## 2013-10-16 DIAGNOSIS — IMO0002 Reserved for concepts with insufficient information to code with codable children: Secondary | ICD-10-CM

## 2013-10-16 DIAGNOSIS — IMO0001 Reserved for inherently not codable concepts without codable children: Secondary | ICD-10-CM

## 2013-10-16 MED ORDER — GLIMEPIRIDE 4 MG PO TABS: 4.0000 mg | ORAL_TABLET | Freq: Every day | ORAL | Status: DC

## 2013-10-16 NOTE — Patient Instructions (Addendum)
Pass by Marion's office to check on availability of diabetes education at [facility]. Decrease fortamet to 1 tablet 1000mg  daily.  Increase amaryl to 4mg  daily (new dose is at pharmacy). Continue januvia 100mg  daily. Return in 3 months for diabetes follow up - if A1c remains elevated we will discuss injectable medications. Look at [employer] website for further resources. Good to see you today.

## 2013-10-16 NOTE — Addendum Note (Signed)
Addended by: Ria Bush on: 10/16/2013 08:30 AM   Modules accepted: Level of Service

## 2013-10-16 NOTE — Progress Notes (Signed)
BP 122/64  Pulse 68  Temp(Src) 98.1 F (36.7 C) (Oral)  Wt 194 lb 12 oz (88.338 kg)   CC: DM f/u  Subjective:    Patient ID: Betty Sanders, female    DOB: 20-Oct-1945, 68 y.o.   MRN: [id number]  HPI: Betty Sanders is a 68 y.o. female presenting on 10/16/2013 for Follow-up   Wt Readings from Last 3 Encounters:  10/16/13 194 lb 12 oz (88.338 kg)  05/16/13 182 lb (82.555 kg)  02/14/13 187 lb 4 oz (84.936 kg)  Body mass index is 33.17 kg/(m^2).  DM - fasting sugars 135-165. Compliant with amaryl 5m daily, januvia 1068mdaily and fortamet 100030mid. Pneumovax 01/2013. Eye exam 05/2013. No low sugars. Highest sugar has been 170s fasting. Checks as needed. No paresthesias.  Continued stomach issues with fortamet - diarrhea. Comes and goes. No fevers, blood in stool. No recent travel or new restaurants. Lab Results  Component Value Date   HGBA1C 8.0* 10/09/2013    Past Medical History  Diagnosis Date  . 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 WilJimmye Norman. Type 2 diabetes mellitus, uncontrolled 02/2012    declines DSME  . Dyslipidemia    Relevant past medical, surgical, family and social history reviewed and updated as indicated.  Allergies and medications reviewed and updated. Current Outpatient Prescriptions on File Prior to Visit  Medication Sig  . Blood Glucose Monitoring Suppl (ONE TOUCH ULTRA SYSTEM KIT) W/DEVICE KIT 1 kit by Does not apply route once.  . gMarland Kitchenucose blood test strip 250.02, check sugars daily.  Use as instructed  . sitaGLIPtin (JANUVIA) 100 MG tablet Take 1 tablet (100 mg total) by mouth daily.  . vitamin C (ASCORBIC ACID) 500 MG tablet Take 500 mg by mouth as needed.  . aMarland Kitchenpirin (ASPIRIN EC) 81 MG EC tablet Take 81 mg by mouth daily. Swallow whole.   No current facility-administered medications on file prior to visit.    Review of Systems Per HPI unless specifically indicated above    Objective:    BP 122/64   Pulse 68  Temp(Src) 98.1 F (36.7 C) (Oral)  Wt 194 lb 12 oz (88.338 kg)  Physical Exam  Nursing note and vitals reviewed. Constitutional: She appears well-developed and well-nourished. No distress.  HENT:  Mouth/Throat: Oropharynx is clear and moist. No oropharyngeal exudate.  Cardiovascular: Normal rate, regular rhythm and intact distal pulses.   Murmur (2/6 SEM best at USB) heard. Pulmonary/Chest: Effort normal and breath sounds normal. No respiratory distress. She has no wheezes. She has no rales.  Musculoskeletal: She exhibits no edema.  Diabetic foot exam: Normal inspection Skin breakdown on bilateral soles No calluses  Normal DP pulses Normal sensation to light touch and monofilament Nails normal   Results for orders placed in visit on 10/09/13  MICROALBUMIN / CREATININE URINE RATIO      Result Value Ref Range   Microalb, Ur 0.3  0.0 - 1.9 mg/dL   Creatinine,U 121.4     Microalb Creat Ratio 0.2  0.0 - 30.0 mg/g  BASIC METABOLIC PANEL      Result Value Ref Range   Sodium 137  135 - 145 mEq/L   Potassium 4.0  3.5 - 5.1 mEq/L   Chloride 103  96 - 112 mEq/L   CO2 27  19 - 32 mEq/L   Glucose, Bld 244 (*) 70 - 99 mg/dL   BUN 10  6 - 23 mg/dL  Creatinine, Ser 0.7  0.4 - 1.2 mg/dL   Calcium 9.2  8.4 - 10.5 mg/dL   GFR 92.06  >60.00 mL/min  HEMOGLOBIN A1C      Result Value Ref Range   Hemoglobin A1C 8.0 (*) 4.6 - 6.5 %      Assessment & Plan:   Problem List Items Addressed This Visit   Type 2 diabetes mellitus, uncontrolled - Primary     Chronically uncontrolled. Will increase amaryl to 4 mg daily. Decrease fortamet to 1020m once daily 2/2 diarrhea and monitor for improvement. Continue [place] [facility] rtc 3 mo for f/u - discussed possibly starting injectable medication next visit if remaining uncontrolled.     Relevant Medications      metformin (FORTAMET) 1000 MG (OSM) 24 hr tablet      glimepiride (AMARYL)  tablet   Other Relevant Orders      Ambulatory referral to diabetic education       Follow up plan: Return in about 3 months (around 01/16/2014), or as needed, for follow up visit.

## 2013-10-16 NOTE — Progress Notes (Signed)
Pre visit review using our clinic review tool, if applicable. No additional management support is needed unless otherwise documented below in the visit note. 

## 2013-10-16 NOTE — Assessment & Plan Note (Signed)
Chronically uncontrolled. Will increase amaryl to 4 mg daily. Decrease fortamet to 1000mg  once daily 2/2 diarrhea and monitor for improvement. Continue [place]. Refer to DSME today (at [facility] if available). rtc 3 mo for f/u - discussed possibly starting injectable medication next visit if remaining uncontrolled.

## 2013-10-27 ENCOUNTER — Encounter: Payer: 59 | Attending: Family Medicine

## 2013-10-27 VITALS — Ht 64.0 in | Wt 192.1 lb

## 2013-10-27 DIAGNOSIS — Z713 Dietary counseling and surveillance: Secondary | ICD-10-CM | POA: Insufficient documentation

## 2013-10-27 DIAGNOSIS — E119 Type 2 diabetes mellitus without complications: Secondary | ICD-10-CM | POA: Diagnosis not present

## 2013-10-29 NOTE — Progress Notes (Signed)
Patient was seen on 10/27/13 for the complete diabetes self-management series at [facility]. This is a part of the [facility] to [employer].  Current A1c = 8.0%  Handouts given during class include:  Living Well with Diabetes book  Carb Counting and Meal Planning book  Meal Plan Card  Carbohydrate guide  Meal planning worksheet  Low Sodium Flavoring Tips  The diabetes portion plate  Low Carbohydrate Snack Suggestions  A1c to eAG Conversion Chart  Diabetes Medications  Stress Management  Diabetes Recommended Care Schedule  Diabetes Success Plan  Core Class Satisfaction Survey  The following learning objectives were met by the patient during this course:  Describe diabetes  State some common risk factors for diabetes  Defines the role of glucose and insulin  Identifies type of diabetes and pathophysiology  Describe the relationship between diabetes and cardiovascular risk  State the members of the Healthcare Team  States the rationale for glucose monitoring  State when to test glucose  State their individual Target Range  State the importance of logging glucose readings  Describe how to interpret glucose readings  Identifies A1C target  Explain the correlation between A1c and eAG values  State symptoms and treatment of high blood glucose  State symptoms and treatment of low blood glucose  Explain proper technique for glucose testing  Identifies proper sharps disposal  Describe the role of different macronutrients on glucose  Explain how carbohydrates affect blood glucose  State what foods contain the most carbohydrates  Demonstrate carbohydrate counting  Demonstrate how to read Nutrition Facts food label  Describe effects of various fats on heart health  Describe the importance of good nutrition for health and healthy eating strategies  Describe techniques for managing your shopping, cooking and meal  planning  List strategies to follow meal plan when dining out  Describe the effects of alcohol on glucose and how to use it safely   State the amount of activity recommended for healthy living   Describe activities suitable for individual needs   Identify ways to regularly incorporate activity into daily life   Identify barriers to activity and ways to over come these barriers  Identify diabetes medications being personally used and their primary action for lowering glucose and possible side effects   Describe role of stress on blood glucose and develop strategies to address psychosocial issues   Identify diabetes complications and ways to prevent them  Explain how to manage diabetes during illness   Evaluate success in meeting personal goal   Establish 2-3 goals that they will plan to diligently work on until they return for the  72-monthfollow-up visit  Goals:  Follow Diabetes Meal Plan as instructed  Eat 3 meals and 2 snacks, every 3-5 hrs  Limit carbohydrate intake to 45 grams carbohydrate/meal Limit carbohydrate intake to 15 grams carbohydrate/snack Add lean protein foods to meals/snacks  Monitor glucose levels as instructed by your doctor  Aim for 15-30 mins of physical activity daily as tolerated  Bring food record and glucose log to all healthcare visits  Your patient has established the following 4 month goals in their individualized success plan: I will count my carb choices at most meals and snacks I will increase my activity level at least 3 days a week I will take my diabetes medications as scheduled I will test my glucose at least 2 times a day I will look at patterns in my record book at least 1 days a month  Your patient has identified these potential barriers to change:  Work hours  stress  Your patient has identified their diabetes self-care support plan as  [facility] Support Group  American Diabetes Association web site  On-line resources  Plan: Follow  up with [facility] to [employer]

## 2013-11-14 ENCOUNTER — Telehealth: Payer: Self-pay | Admitting: Family Medicine

## 2013-11-14 NOTE — Telephone Encounter (Signed)
Pt called requesting new RX for whichever blood glucose meter the [facility] pharmacy carries because they no longer carry the strips for her current meter.  I spoke with the pharmacist at [facility] who said that she gave pt a phone number for the diabetes program for [employer].  She said that they will give her a meter for free and once they do that she'll just need to call us and let us know which meter she has so that we can send in RX for the appropriate strips.  Spoke with pt, verbalized understanding.

## 2013-12-10 ENCOUNTER — Other Ambulatory Visit: Payer: Self-pay | Admitting: Family Medicine

## 2014-01-15 ENCOUNTER — Other Ambulatory Visit: Payer: Self-pay | Admitting: Family Medicine

## 2014-01-15 DIAGNOSIS — E1165 Type 2 diabetes mellitus with hyperglycemia: Secondary | ICD-10-CM

## 2014-01-15 DIAGNOSIS — IMO0002 Reserved for concepts with insufficient information to code with codable children: Secondary | ICD-10-CM

## 2014-01-16 ENCOUNTER — Other Ambulatory Visit (INDEPENDENT_AMBULATORY_CARE_PROVIDER_SITE_OTHER): Payer: 59

## 2014-01-16 DIAGNOSIS — E1165 Type 2 diabetes mellitus with hyperglycemia: Secondary | ICD-10-CM

## 2014-01-16 DIAGNOSIS — IMO0001 Reserved for inherently not codable concepts without codable children: Secondary | ICD-10-CM

## 2014-01-16 DIAGNOSIS — E785 Hyperlipidemia, unspecified: Secondary | ICD-10-CM

## 2014-01-16 DIAGNOSIS — IMO0002 Reserved for concepts with insufficient information to code with codable children: Secondary | ICD-10-CM

## 2014-01-16 LAB — BASIC METABOLIC PANEL
BUN: 15 mg/dL (ref 6–23)
CHLORIDE: 104 meq/L (ref 96–112)
CO2: 28 mEq/L (ref 19–32)
Calcium: 9.5 mg/dL (ref 8.4–10.5)
Creatinine, Ser: 0.8 mg/dL (ref 0.4–1.2)
GFR: 75.17 mL/min (ref >60.00)
Glucose, Bld: 112 mg/dL — ABNORMAL HIGH (ref 70–99)
POTASSIUM: 4.3 meq/L (ref 3.5–5.1)
SODIUM: 137 meq/L (ref 135–145)

## 2014-01-16 LAB — HEMOGLOBIN A1C: HEMOGLOBIN A1C: 5.8 % (ref 4.6–6.5)

## 2014-01-18 ENCOUNTER — Ambulatory Visit (INDEPENDENT_AMBULATORY_CARE_PROVIDER_SITE_OTHER): Payer: 59 | Admitting: Family Medicine

## 2014-01-18 ENCOUNTER — Encounter: Payer: Self-pay | Admitting: Family Medicine

## 2014-01-18 VITALS — BP 122/68 | HR 63 | Temp 98.4°F | Wt 173.5 lb

## 2014-01-18 DIAGNOSIS — Z23 Encounter for immunization: Secondary | ICD-10-CM

## 2014-01-18 DIAGNOSIS — IMO0002 Reserved for concepts with insufficient information to code with codable children: Secondary | ICD-10-CM

## 2014-01-18 DIAGNOSIS — E1165 Type 2 diabetes mellitus with hyperglycemia: Secondary | ICD-10-CM

## 2014-01-18 MED ORDER — GLUCOSE BLOOD VI STRP: ORAL_STRIP | Status: DC

## 2014-01-18 MED ORDER — GLIMEPIRIDE 2 MG PO TABS: 2.0000 mg | ORAL_TABLET | Freq: Every day | ORAL | Status: DC

## 2014-01-18 NOTE — Patient Instructions (Signed)
Flu shot today. Decrease amaryl to 2mg  daily. Good to see you today, great job with weight loss and great control of diabetes! Return as needed or in 6 months for physical.

## 2014-01-18 NOTE — Progress Notes (Signed)
Pre visit review using our clinic review tool, if applicable. No additional management support is needed unless otherwise documented below in the visit note. 

## 2014-01-18 NOTE — Assessment & Plan Note (Signed)
Chronic, significant improvement in sugars after DSME. Congratulated on weight loss and A1c. Decrease amaryl to 2mg  daily given endorsed lows. Continue metformin 500mg  bid and januvia 100mg  daily. RTC 6 mo f/u

## 2014-01-18 NOTE — Progress Notes (Signed)
BP 122/68  Pulse 63  Temp(Src) 98.4 F (36.9 C) (Oral)  Wt 173 lb 8 oz (78.699 kg)  SpO2 97%   CC: 3 mo DM f/u  Subjective:    Patient ID: Betty Sanders, female    DOB: [birth year], 68 y.o.   MRN: [id number]  HPI: Betty Sanders is a 68 y.o. female presenting on 01/18/2014 for Follow-up   DM - fasting sugars 135-165. Compliant with amaryl 63m daily, januvia 1062mdaily and fortamet 50022mwice daily. Some low sugars. Went to DMSE class in July. Lost 20 lbs. Counting carbs. Checks bid. Denies paresthesias. Pneumovax 01/2013. Eye exam 05/2013. Foot exam 09/2013. Lab Results  Component Value Date   HGBA1C 5.8 01/16/2014   Wt Readings from Last 3 Encounters:  01/18/14 173 lb 8 oz (78.699 kg)  10/29/13 192 lb 1.6 oz (87.136 kg)  10/16/13 194 lb 12 oz (88.338 kg)   Relevant past medical, surgical, family and social history reviewed and updated as indicated.  Allergies and medications reviewed and updated. Current Outpatient Prescriptions on File Prior to Visit  Medication Sig  . aspirin (ASPIRIN EC) 81 MG EC tablet Take 81 mg by mouth daily. Swallow whole.  . Blood Glucose Monitoring Suppl (ONE TOUCH ULTRA SYSTEM KIT) W/DEVICE KIT 1 kit by Does not apply route once.  . JMarland KitchenNUVIA 100 MG tablet Take 1 tablet (100 mg total) by mouth daily.  . vitamin C (ASCORBIC ACID) 500 MG tablet Take 500 mg by mouth as needed.   No current facility-administered medications on file prior to visit.    Review of Systems Per HPI unless specifically indicated above    Objective:    BP 122/68  Pulse 63  Temp(Src) 98.4 F (36.9 C) (Oral)  Wt 173 lb 8 oz (78.699 kg)  SpO2 97%  Physical Exam  Nursing note and vitals reviewed. Constitutional: She appears well-developed and well-nourished. No distress.  HENT:  Head: Normocephalic and atraumatic.  Right Ear: External ear normal.  Left Ear: External ear normal.  Nose: Nose normal.  Mouth/Throat: Oropharynx is clear and moist. No oropharyngeal  exudate.  Eyes: Conjunctivae and EOM are normal. Pupils are equal, round, and reactive to light. No scleral icterus.  Neck: Normal range of motion. Neck supple.  Cardiovascular: Normal rate, regular rhythm and intact distal pulses.   Murmur (slight 2/6 SEM) heard. Pulmonary/Chest: Effort normal and breath sounds normal. No respiratory distress. She has no wheezes. She has no rales.  Musculoskeletal: She exhibits no edema.  Diabetic foot exam: Normal inspection No skin breakdown No calluses  Normal DP/PT pulses Normal sensation to light tough and monofilament Nails normal  Lymphadenopathy:    She has no cervical adenopathy.  Skin: Skin is warm and dry. No rash noted.  Psychiatric: She has a normal mood and affect.   Results for orders placed in visit on 01/16/51/84ASIC METABOLIC PANEL      Result Value Ref Range   Sodium 137  135 - 145 mEq/L   Potassium 4.3  3.5 - 5.1 mEq/L   Chloride 104  96 - 112 mEq/L   CO2 28  19 - 32 mEq/L   Glucose, Bld 112 (*) 70 - 99 mg/dL   BUN 15  6 - 23 mg/dL   Creatinine, Ser 0.8  0.4 - 1.2 mg/dL   Calcium 9.5  8.4 - 10.5 mg/dL   GFR 75.17  >60.00 mL/min  HEMOGLOBIN A1C      Result Value Ref Range   Hemoglobin  A1C 5.8  4.6 - 6.5 %      Assessment & Plan:   Problem List Items Addressed This Visit   Diabetes mellitus type 2, controlled - Primary     Chronic, significant improvement in sugars after DSME. Congratulated on weight loss and A1c. Decrease amaryl to 13m daily given endorsed lows. Continue metformin 5066mbid and januvia 10060maily. RTC 6 mo f/u    Relevant Medications      metFORMIN (GLUCOPHAGE) 500 MG tablet      glimepiride (AMARYL) tablet    Other Visit Diagnoses   Need for prophylactic vaccination and inoculation against influenza        Relevant Orders       Flu Vaccine QUAD 36+ mos PF IM (Fluarix Quad PF)        Follow up plan: Return in about 6 months (around 07/20/2014), or as needed, for annual exam, prior fasting  for blood work.

## 2014-01-25 ENCOUNTER — Encounter: Payer: Self-pay | Admitting: Internal Medicine

## 2014-03-27 ENCOUNTER — Encounter: Payer: Self-pay | Admitting: Family Medicine

## 2014-03-27 LAB — HM MAMMOGRAPHY: HM Mammogram: NORMAL

## 2014-03-28 ENCOUNTER — Encounter: Payer: Self-pay | Admitting: *Deleted

## 2014-04-19 DIAGNOSIS — H9193 Unspecified hearing loss, bilateral: Secondary | ICD-10-CM

## 2014-04-19 HISTORY — DX: Unspecified hearing loss, bilateral: H91.93

## 2014-05-20 DIAGNOSIS — M8580 Other specified disorders of bone density and structure, unspecified site: Secondary | ICD-10-CM

## 2014-05-20 HISTORY — PX: OTHER SURGICAL HISTORY: SHX169

## 2014-05-20 HISTORY — DX: Other specified disorders of bone density and structure, unspecified site: M85.80

## 2014-06-05 ENCOUNTER — Ambulatory Visit (INDEPENDENT_AMBULATORY_CARE_PROVIDER_SITE_OTHER): Payer: 59 | Admitting: Family Medicine

## 2014-06-05 ENCOUNTER — Ambulatory Visit: Payer: 59 | Admitting: Family Medicine

## 2014-06-05 ENCOUNTER — Encounter: Payer: Self-pay | Admitting: Family Medicine

## 2014-06-05 ENCOUNTER — Ambulatory Visit (INDEPENDENT_AMBULATORY_CARE_PROVIDER_SITE_OTHER)
Admission: RE | Admit: 2014-06-05 | Discharge: 2014-06-05 | Disposition: A | Discharge status: Home / Self Care | Payer: 59 | Source: Ambulatory Visit | Attending: Family Medicine | Admitting: Family Medicine

## 2014-06-05 VITALS — BP 108/64 | HR 72 | Temp 98.0°F | Wt 180.2 lb

## 2014-06-05 DIAGNOSIS — E2839 Other primary ovarian failure: Secondary | ICD-10-CM

## 2014-06-05 DIAGNOSIS — M546 Pain in thoracic spine: Secondary | ICD-10-CM

## 2014-06-05 MED ORDER — NAPROXEN 500 MG PO TABS: ORAL_TABLET | ORAL | Status: DC

## 2014-06-05 NOTE — Assessment & Plan Note (Signed)
Given longevity of sxs, will obtain thoracic spine xray and update pt with results. R/o compression fx, significant DDD. Treat pain with naprosyn 500mg  bid course. Update [place] with effect after 1 wk naprosyn Will also recommend updating DEXA - has not had in past. Pt requests at [facility]. Pt agrees with plan. Has CPE scheduled for 07/2014.

## 2014-06-05 NOTE — Progress Notes (Addendum)
BP 108/64 mmHg  Pulse 72  Temp(Src) 98 F (36.7 C) (Oral)  Wt 180 lb 4 oz (81.761 kg)   CC: back pain   Subjective:    Patient ID: Betty Sanders, female    DOB: February 14, 1946, 69 y.o.   MRN: [id number]  HPI: Betty Sanders is a 69 y.o. female presenting on 06/05/2014 for Back Pain   4 mo h/o thoracic back pain that catches with certain movements - describes sharp stabbing pain starting around T6-8 with radiation in band to front of abdomen. Advil helps. No fevers/chills, no arm pain, numbness or tingling. Never rash.  Limiting ability to exercise, and even clean floor. Has to sleep flat supine.  May have started after pushing 50 gallon tank at work.   Had similar sxs 04/2012 - which resolved with NSAID and flexeril.  Lab Results  Component Value Date   CREATININE 0.8 01/16/2014    Relevant past medical, surgical, family and social history reviewed and updated as indicated. Interim medical history since our last visit reviewed. Allergies and medications reviewed and updated. Current Outpatient Prescriptions on File Prior to Visit  Medication Sig  . aspirin (ASPIRIN EC) 81 MG EC tablet Take 81 mg by mouth daily. Swallow whole.  . Blood Glucose Monitoring Suppl (ONE TOUCH ULTRA SYSTEM KIT) W/DEVICE KIT 1 kit by Does not apply route once.  Marland Kitchen glimepiride (AMARYL) 2 MG tablet Take 1 tablet (2 mg total) by mouth daily before breakfast.  . glucose blood test strip 250.02, check sugars daily.  Use as instructed (Patient taking differently: 250.02, check sugars daily.  Use as instructed (Contour))  . JANUVIA 100 MG tablet Take 1 tablet (100 mg total) by mouth daily.  . metFORMIN (GLUCOPHAGE) 500 MG tablet Take 1 tablet (500 mg total) by mouth twice daily with a meal.  . vitamin C (ASCORBIC ACID) 500 MG tablet Take 500 mg by mouth as needed.   No current facility-administered medications on file prior to visit.    Review of Systems Per HPI unless specifically indicated above     Objective:    BP 108/64 mmHg  Pulse 72  Temp(Src) 98 F (36.7 C) (Oral)  Wt 180 lb 4 oz (81.761 kg)  Wt Readings from Last 3 Encounters:  06/05/14 180 lb 4 oz (81.761 kg)  01/18/14 173 lb 8 oz (78.699 kg)  10/29/13 192 lb 1.6 oz (87.136 kg)    Physical Exam  Constitutional: She appears well-developed and well-nourished. No distress.  HENT:  Mouth/Throat: Oropharynx is clear and moist. No oropharyngeal exudate.  Cardiovascular: Normal rate, regular rhythm and intact distal pulses.   Murmur (2/6 SEM) heard. Pulmonary/Chest: Effort normal and breath sounds normal. No respiratory distress. She has no wheezes. She has no rales. She exhibits no tenderness.  Abdominal: Soft. Bowel sounds are normal. She exhibits no distension and no mass. There is no tenderness. There is no rebound and no guarding.  Musculoskeletal: She exhibits no edema.  Mild tenderness to palpation mid thoracic spine No paraspinous mm tenderness  Nursing note and vitals reviewed.  Results for orders placed or performed in visit on 03/28/14  HM MAMMOGRAPHY  Result Value Ref Range   HM Mammogram Normal-Birads 1-Repeat 1 year       Assessment & Plan:   Problem List Items Addressed This Visit    Thoracic back pain - Primary    Given longevity of sxs, will obtain thoracic spine xray and update pt with results. R/o compression fx, significant  DDD. Treat pain with naprosyn 590m bid course. Update u[place]ore[place]ith effect after 1 wk naprosyn Will also recommend updating DEXA - has not had in past. [facility]. Has CPE scheduled for 07/2014.      Relevant Medications   naproxen (NAPROSYN) tablet   Other Relevant Orders   DG Thoracic Spine W/Swimmers    Other Visit Diagnoses    Estrogen deficiency        Relevant Orders    DG Bone Density        Follow up plan: Return if symptoms worsen or fail to improve.

## 2014-06-05 NOTE — Patient Instructions (Signed)
I am suspicious for pain coming from thoracic spine. Xray today - we'll call you with results. Start naprosyn 500mg  twice daily with food for 5-7 days then as needed.

## 2014-06-05 NOTE — Progress Notes (Signed)
Pre visit review using our clinic review tool, if applicable. No additional management support is needed unless otherwise documented below in the visit note. 

## 2014-06-06 ENCOUNTER — Other Ambulatory Visit: Payer: Self-pay | Admitting: Family Medicine

## 2014-06-06 ENCOUNTER — Encounter: Payer: Self-pay | Admitting: Family Medicine

## 2014-06-06 DIAGNOSIS — M546 Pain in thoracic spine: Secondary | ICD-10-CM

## 2014-06-13 ENCOUNTER — Encounter: Payer: Self-pay | Admitting: Family Medicine

## 2014-06-13 ENCOUNTER — Telehealth: Payer: Self-pay | Admitting: Family Medicine

## 2014-06-13 ENCOUNTER — Encounter: Payer: Self-pay | Admitting: *Deleted

## 2014-06-13 DIAGNOSIS — M8580 Other specified disorders of bone density and structure, unspecified site: Secondary | ICD-10-CM

## 2014-06-13 NOTE — Telephone Encounter (Signed)
Letter mailed notifying patient. 

## 2014-06-13 NOTE — Telephone Encounter (Signed)
plz notify we received copy of bone density scan showing osteopenia - recommend goal calcium 1200mg  daily (1 glass of cal fortified OJ is 300mg , 1 glass milk is 300mg ) and goal vitamin D 1000 units daily. If not reaching these goals with dietary intake, recommend start supplement (cal/vit D 600/400 once to twice daily or extra vitamin D 1000 units daily). May check vit D next blood work.

## 2014-06-18 ENCOUNTER — Encounter: Payer: Self-pay | Admitting: Family Medicine

## 2014-07-17 ENCOUNTER — Other Ambulatory Visit: Payer: 59

## 2014-07-24 ENCOUNTER — Encounter: Payer: 59 | Admitting: Family Medicine

## 2014-08-05 ENCOUNTER — Other Ambulatory Visit: Payer: Self-pay | Admitting: Family Medicine

## 2014-08-28 ENCOUNTER — Other Ambulatory Visit: Payer: Self-pay

## 2014-08-28 NOTE — Patient Outreach (Signed)
[facility]) Care Management  08/28/2014  Betty Sanders Nov 08, 1945 [id number]   Patient did not show for her scheduled visit.    [name], RN, BA, [place], [place] Direct Dial:  [phone]  Fax:  [phone] E-mail: [email or web address] [address] [place], [place]  [address]

## 2014-08-31 ENCOUNTER — Other Ambulatory Visit: Payer: Self-pay | Admitting: Family Medicine

## 2014-08-31 DIAGNOSIS — E785 Hyperlipidemia, unspecified: Secondary | ICD-10-CM

## 2014-08-31 DIAGNOSIS — E119 Type 2 diabetes mellitus without complications: Secondary | ICD-10-CM

## 2014-08-31 DIAGNOSIS — M8580 Other specified disorders of bone density and structure, unspecified site: Secondary | ICD-10-CM

## 2014-09-04 ENCOUNTER — Other Ambulatory Visit (INDEPENDENT_AMBULATORY_CARE_PROVIDER_SITE_OTHER): Payer: 59

## 2014-09-04 DIAGNOSIS — M8580 Other specified disorders of bone density and structure, unspecified site: Secondary | ICD-10-CM

## 2014-09-04 DIAGNOSIS — E119 Type 2 diabetes mellitus without complications: Secondary | ICD-10-CM

## 2014-09-04 DIAGNOSIS — E785 Hyperlipidemia, unspecified: Secondary | ICD-10-CM | POA: Diagnosis not present

## 2014-09-04 LAB — BASIC METABOLIC PANEL
BUN: 14 mg/dL (ref 6–23)
CHLORIDE: 101 meq/L (ref 96–112)
CO2: 27 meq/L (ref 19–32)
Calcium: 9.5 mg/dL (ref 8.4–10.5)
Creatinine, Ser: 0.73 mg/dL (ref 0.40–1.20)
GFR: 84.59 mL/min (ref >60.00)
Glucose, Bld: 177 mg/dL — ABNORMAL HIGH (ref 70–99)
POTASSIUM: 4 meq/L (ref 3.5–5.1)
Sodium: 135 mEq/L (ref 135–145)

## 2014-09-04 LAB — LIPID PANEL
Cholesterol: 208 mg/dL — ABNORMAL HIGH (ref 0–200)
HDL: 42.9 mg/dL (ref >39.00)
LDL CALC: 133 mg/dL — AB (ref 0–99)
NonHDL: 165.1
TRIGLYCERIDES: 159 mg/dL — AB (ref 0.0–149.0)
Total CHOL/HDL Ratio: 5
VLDL: 31.8 mg/dL (ref 0.0–40.0)

## 2014-09-04 LAB — VITAMIN D 25 HYDROXY (VIT D DEFICIENCY, FRACTURES): VITD: 30.15 ng/mL (ref 30.00–100.00)

## 2014-09-04 LAB — HEMOGLOBIN A1C: Hgb A1c MFr Bld: 7.3 % — ABNORMAL HIGH (ref 4.6–6.5)

## 2014-09-09 ENCOUNTER — Other Ambulatory Visit: Payer: 59

## 2014-09-12 ENCOUNTER — Ambulatory Visit (INDEPENDENT_AMBULATORY_CARE_PROVIDER_SITE_OTHER): Payer: 59 | Admitting: Family Medicine

## 2014-09-12 ENCOUNTER — Encounter: Payer: Self-pay | Admitting: Family Medicine

## 2014-09-12 VITALS — BP 106/64 | HR 69 | Temp 98.5°F | Ht 64.0 in | Wt 190.0 lb

## 2014-09-12 DIAGNOSIS — Z23 Encounter for immunization: Secondary | ICD-10-CM | POA: Diagnosis not present

## 2014-09-12 DIAGNOSIS — Z0000 Encounter for general adult medical examination without abnormal findings: Secondary | ICD-10-CM | POA: Diagnosis not present

## 2014-09-12 DIAGNOSIS — E785 Hyperlipidemia, unspecified: Secondary | ICD-10-CM

## 2014-09-12 DIAGNOSIS — E1165 Type 2 diabetes mellitus with hyperglycemia: Secondary | ICD-10-CM

## 2014-09-12 DIAGNOSIS — IMO0001 Reserved for inherently not codable concepts without codable children: Secondary | ICD-10-CM

## 2014-09-12 DIAGNOSIS — M8580 Other specified disorders of bone density and structure, unspecified site: Secondary | ICD-10-CM

## 2014-09-12 NOTE — Assessment & Plan Note (Signed)
Encouraged getting back on track in relation to diet, carb counting, healthy lifestyle. Pt motivated to restart. RTC 4 mo f/u visit.

## 2014-09-12 NOTE — Addendum Note (Signed)
Addended by: Clarksdale Lions on: 09/12/2014 06:40 PM   Modules accepted: Orders

## 2014-09-12 NOTE — Assessment & Plan Note (Signed)
Deteriorated. Discussed dietary changes to improve #s. Discusses statin if LDL remains >100

## 2014-09-12 NOTE — Progress Notes (Signed)
BP 106/64 mmHg  Pulse 69  Temp(Src) 98.5 F (36.9 C) (Oral)  Ht '5\' 4"'  (1.626 m)  Wt 190 lb (86.183 kg)  BMI 32.60 kg/m2  SpO2 97%   CC: CPE  Subjective:    Patient ID: Betty Sanders, female    DOB: Apr 11, 1946, 69 y.o.   MRN: [id number]  HPI: Betty Sanders is a 69 y.o. female presenting on 09/12/2014 for Annual Exam   Increased work stress. Toying with idea of retiring.  Recently got hearing aides. DM - fell off the wagon.   Denies depression, sadness, anhedonia.   Preventative: Colonoscopy 02/2010, Dr. Carlean Purl, rec rpt 5 yrs given polyps Mammogram 03/2014, Birads 1.  Well woman exam - always normal.last 04/2013. fmhx (aunt) GYN cancer, unsure type. Discussed, will space out to Q3 yrs. DEXA - osteopenia 05/2014 Flu shot - 102/105 Td 2009  Pneumovax 01/2013, prevnar today Shingles shot - will check with insurance seat belt use discussed Sunscreen use discussed - no changing moles on skin  Lives with husband and 1 dog ([place])  Occupation: works at [employer] in [employer], Systems analyst.  Activity: decreased recently - kayak, restarting treadmill, wears fit bit - wants to get to 10000steps/day  Diet: My.fitness.pal 1200cal/day, good water, fruits/vegetables daily  Relevant past medical, surgical, family and social history reviewed and updated as indicated. Interim medical history since our last visit reviewed. Allergies and medications reviewed and updated. Current Outpatient Prescriptions on File Prior to Visit  Medication Sig  . aspirin (ASPIRIN EC) 81 MG EC tablet Take 81 mg by mouth daily. Swallow whole.  . Blood Glucose Monitoring Suppl (ONE TOUCH ULTRA SYSTEM KIT) W/DEVICE KIT 1 kit by Does not apply route once.  Marland Kitchen glimepiride (AMARYL) 2 MG tablet Take 1 tablet (2 mg total) by mouth daily before breakfast.  . glucose blood test strip 250.02, check sugars daily.  Use as instructed (Patient taking differently: 250.02, check sugars daily.  Use as  instructed (Contour))  . JANUVIA 100 MG tablet TAKE 1 TABLET BY MOUTH DAILY.  . metFORMIN (GLUCOPHAGE) 500 MG tablet Take 1 tablet (500 mg total) by mouth twice daily with a meal.  . naproxen (NAPROSYN) 500 MG tablet Take one po bid x 1 week then prn pain, take with food  . vitamin C (ASCORBIC ACID) 500 MG tablet Take 500 mg by mouth as needed.   No current facility-administered medications on file prior to visit.    Review of Systems  Constitutional: Negative for fever, chills, activity change, appetite change, fatigue and unexpected weight change.  HENT: Negative for hearing loss.   Eyes: Negative for visual disturbance.  Respiratory: Negative for cough, chest tightness, shortness of breath and wheezing.   Cardiovascular: Negative for chest pain, palpitations and leg swelling.  Gastrointestinal: Negative for nausea, vomiting, abdominal pain, diarrhea, constipation, blood in stool and abdominal distention.  Genitourinary: Negative for hematuria and difficulty urinating.  Musculoskeletal: Negative for myalgias, arthralgias and neck pain.  Skin: Negative for rash.  Neurological: Negative for dizziness, seizures, syncope and headaches.  Hematological: Negative for adenopathy. Does not bruise/bleed easily.  Psychiatric/Behavioral: Negative for dysphoric mood. The patient is not nervous/anxious.    Per HPI unless specifically indicated above     Objective:    BP 106/64 mmHg  Pulse 69  Temp(Src) 98.5 F (36.9 C) (Oral)  Ht '5\' 4"'  (1.626 m)  Wt 190 lb (86.183 kg)  BMI 32.60 kg/m2  SpO2 97%  Wt Readings from Last 3 Encounters:  09/12/14 190 lb (86.183 kg)  06/05/14 180 lb 4 oz (81.761 kg)  01/18/14 173 lb 8 oz (78.699 kg)    Physical Exam  Constitutional: She is oriented to person, place, and time. She appears well-developed and well-nourished. No distress.  HENT:  Head: Normocephalic and atraumatic.  Right Ear: Hearing, tympanic membrane, external ear and ear canal normal.    Left Ear: Hearing, tympanic membrane, external ear and ear canal normal.  Nose: Nose normal.  Mouth/Throat: Uvula is midline, oropharynx is clear and moist and mucous membranes are normal. No oropharyngeal exudate, posterior oropharyngeal edema or posterior oropharyngeal erythema.  Eyes: Conjunctivae and EOM are normal. Pupils are equal, round, and reactive to light. No scleral icterus.  Neck: Normal range of motion. Neck supple. Carotid bruit is present (bilateral, anticipate referred from systolic murmur). No thyromegaly present.  Cardiovascular: Normal rate, regular rhythm and intact distal pulses.   Murmur (3/6 SEM best at LUSB) heard. Pulses:      Radial pulses are 2+ on the right side, and 2+ on the left side.  Pulmonary/Chest: Effort normal and breath sounds normal. No respiratory distress. She has no wheezes. She has no rales.  Abdominal: Soft. Bowel sounds are normal. She exhibits no distension and no mass. There is no tenderness. There is no rebound and no guarding.  Musculoskeletal: Normal range of motion. She exhibits no edema.  Lymphadenopathy:    She has no cervical adenopathy.  Neurological: She is alert and oriented to person, place, and time.  CN grossly intact, station and gait intact  Skin: Skin is warm and dry. No rash noted.  Psychiatric: She has a normal mood and affect. Her behavior is normal. Judgment and thought content normal.  Nursing note and vitals reviewed.  Results for orders placed or performed in visit on 25/36/64  Basic metabolic panel  Result Value Ref Range   Sodium 135 135 - 145 mEq/L   Potassium 4.0 3.5 - 5.1 mEq/L   Chloride 101 96 - 112 mEq/L   CO2 27 19 - 32 mEq/L   Glucose, Bld 177 (H) 70 - 99 mg/dL   BUN 14 6 - 23 mg/dL   Creatinine, Ser 0.73 0.40 - 1.20 mg/dL   Calcium 9.5 8.4 - 10.5 mg/dL   GFR 84.59 >60.00 mL/min  Hemoglobin A1c  Result Value Ref Range   Hgb A1c MFr Bld 7.3 (H) 4.6 - 6.5 %  Lipid panel  Result Value Ref Range    Cholesterol 208 (H) 0 - 200 mg/dL   Triglycerides 159.0 (H) 0.0 - 149.0 mg/dL   HDL 42.90 >39.00 mg/dL   VLDL 31.8 0.0 - 40.0 mg/dL   LDL Cholesterol 133 (H) 0 - 99 mg/dL   Total CHOL/HDL Ratio 5    NonHDL 165.10   Vit D  25 hydroxy (rtn osteoporosis monitoring)  Result Value Ref Range   VITD 30.15 30.00 - 100.00 ng/mL      Assessment & Plan:   Problem List Items Addressed This Visit    Diabetes mellitus type 2, uncontrolled, without complications    Encouraged getting back on track in relation to diet, carb counting, healthy lifestyle. Pt motivated to restart. RTC 4 mo f/u visit.      Dyslipidemia    Deteriorated. Discussed dietary changes to improve #s. Discusses statin if LDL remains >100      Healthcare maintenance - Primary    Preventative protocols reviewed and updated unless pt declined. Discussed healthy diet and lifestyle.  Osteopenia    Discussed cal/vit D intake          Follow up plan: Return in about 4 months (around 01/13/2015), or as needed, for follow up visit.

## 2014-09-12 NOTE — Progress Notes (Signed)
Pre visit review using our clinic review tool, if applicable. No additional management support is needed unless otherwise documented below in the visit note. 

## 2014-09-12 NOTE — Assessment & Plan Note (Signed)
Preventative protocols reviewed and updated unless pt declined. Discussed healthy diet and lifestyle.  

## 2014-09-12 NOTE — Patient Instructions (Addendum)
prevnar today. Call your insurance about the shingles shot to see if it is covered or how much it would cost and where is cheaper (here or pharmacy).  If you want to receive here, call for nurse visit. Return in 4 months for follow up visit.

## 2014-09-12 NOTE — Addendum Note (Signed)
Addended by: Ria Bush on: 09/12/2014 07:06 PM   Modules accepted: Miquel Dunn

## 2014-09-12 NOTE — Assessment & Plan Note (Signed)
Discussed cal/vit D intake.  

## 2014-10-16 LAB — HM DIABETES EYE EXAM

## 2014-10-17 ENCOUNTER — Encounter: Payer: Self-pay | Admitting: Family Medicine

## 2014-11-27 ENCOUNTER — Other Ambulatory Visit: Payer: Self-pay | Admitting: Family Medicine

## 2014-12-31 ENCOUNTER — Encounter: Payer: Self-pay | Admitting: Family Medicine

## 2014-12-31 NOTE — Patient Outreach (Signed)
[facility]) Care Management  12/31/2014  Betty Sanders 02-Feb-1946 [id number]   Patient has been discharged from the [facility] program. She did not show for her scheduled appointment on 08/28/14 and did not call to reschedule when she received the missed appointment letter.    [name], RN, BA, [place], [place] Direct Dial:  [phone]  Fax:  [phone] E-mail: [email or web address] [address], [place], [place]  [address]

## 2015-01-13 ENCOUNTER — Ambulatory Visit: Payer: 59 | Admitting: Family Medicine

## 2015-01-22 ENCOUNTER — Encounter: Payer: Self-pay | Admitting: Family Medicine

## 2015-01-22 ENCOUNTER — Ambulatory Visit (INDEPENDENT_AMBULATORY_CARE_PROVIDER_SITE_OTHER): Payer: 59 | Admitting: Family Medicine

## 2015-01-22 VITALS — BP 116/72 | HR 72 | Temp 98.0°F | Wt 194.8 lb

## 2015-01-22 DIAGNOSIS — Z566 Other physical and mental strain related to work: Secondary | ICD-10-CM

## 2015-01-22 DIAGNOSIS — E1165 Type 2 diabetes mellitus with hyperglycemia: Secondary | ICD-10-CM

## 2015-01-22 DIAGNOSIS — M5134 Other intervertebral disc degeneration, thoracic region: Secondary | ICD-10-CM

## 2015-01-22 DIAGNOSIS — E785 Hyperlipidemia, unspecified: Secondary | ICD-10-CM

## 2015-01-22 DIAGNOSIS — IMO0001 Reserved for inherently not codable concepts without codable children: Secondary | ICD-10-CM

## 2015-01-22 NOTE — Patient Instructions (Addendum)
Call your insurance about the shingles shot to see if it is covered or how much it would cost and where is cheaper (here or pharmacy).  If you want to receive here, call for nurse visit.. Flu shot at work Return in 3-4 months for follow up visit and labwork.

## 2015-01-22 NOTE — Assessment & Plan Note (Signed)
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

## 2015-01-22 NOTE — Assessment & Plan Note (Signed)
Chronic, staying uncontrolled. Trouble finding motivation to implement necessary diet and lifestyle changes needed to control diabetes.  Higher metformin dose limited by GI distress. Discussed option to increase amaryl - pt requests 3 more months of lifestyle changes prior to making changes. labwork next visit.

## 2015-01-22 NOTE — Progress Notes (Signed)
BP 116/72 mmHg  Pulse 72  Temp(Src) 98 F (36.7 C) (Oral)  Wt 194 lb 12 oz (88.338 kg)   CC: 4 mo DM f/u  Subjective:    Patient ID: Betty Sanders, female    DOB: 16-Jun-1945, 69 y.o.   MRN: [id number]  HPI: Betty Sanders is a 69 y.o. female presenting on 01/22/2015 for Follow-up   Stays stressed with work. Wants to retired. In "emotional donut hole". Stressed with life stessors.   Known thoracic DDD. No numbness.   DM - regularly does check sugars - 187 this morning fasting. Compliant with antihyperglycemic regimen which includes: januvia 100, metformin 500 bid and amaryl 22m. Struggling with healthy food choices. Higher metformin caused GI distress. Denies low sugars or hypoglycemic symptoms.  Denies paresthesias. Last diabetic eye exam 09/2014.  Pneumovax: 2014.  Prevnar: 2016. Lab Results  Component Value Date   HGBA1C 7.3* 09/04/2014   Diabetic Foot Exam - Simple   No data filed      Relevant past medical, surgical, family and social history reviewed and updated as indicated. Interim medical history since our last visit reviewed. Allergies and medications reviewed and updated. Current Outpatient Prescriptions on File Prior to Visit  Medication Sig  . aspirin (ASPIRIN EC) 81 MG EC tablet Take 81 mg by mouth daily. Swallow whole.  . Blood Glucose Monitoring Suppl (ONE TOUCH ULTRA SYSTEM KIT) W/DEVICE KIT 1 kit by Does not apply route once.  .Marland Kitchenglimepiride (AMARYL) 2 MG tablet Take 1 tablet (2 mg total) by mouth daily before breakfast.  . glucose blood test strip 250.02, check sugars daily.  Use as instructed (Patient taking differently: 250.02, check sugars daily.  Use as instructed (Contour))  . JANUVIA 100 MG tablet TAKE 1 TABLET BY MOUTH DAILY.  . metFORMIN (GLUCOPHAGE) 500 MG tablet Take 1 tablet (500 mg total) by mouth twice daily with a meal.  . naproxen (NAPROSYN) 500 MG tablet Take one po bid x 1 week then prn pain, take with food  . vitamin C (ASCORBIC ACID)  500 MG tablet Take 500 mg by mouth as needed.   No current facility-administered medications on file prior to visit.    Review of Systems Per HPI unless specifically indicated above     Objective:    BP 116/72 mmHg  Pulse 72  Temp(Src) 98 F (36.7 C) (Oral)  Wt 194 lb 12 oz (88.338 kg)  Wt Readings from Last 3 Encounters:  01/22/15 194 lb 12 oz (88.338 kg)  09/12/14 190 lb (86.183 kg)  06/05/14 180 lb 4 oz (81.761 kg)    Physical Exam  Constitutional: She appears well-developed and well-nourished. No distress.  HENT:  Head: Normocephalic and atraumatic.  Right Ear: External ear normal.  Left Ear: External ear normal.  Nose: Nose normal.  Mouth/Throat: Oropharynx is clear and moist. No oropharyngeal exudate.  Eyes: Conjunctivae and EOM are normal. Pupils are equal, round, and reactive to light. No scleral icterus.  Neck: Normal range of motion. Neck supple.  Cardiovascular: Normal rate, regular rhythm, normal heart sounds and intact distal pulses.   No murmur heard. Pulmonary/Chest: Effort normal and breath sounds normal. No respiratory distress. She has no wheezes. She has no rales.  Musculoskeletal: She exhibits no edema.  See HPI for foot exam if done  Lymphadenopathy:    She has no cervical adenopathy.  Skin: Skin is warm and dry. No rash noted.  Psychiatric: She has a normal mood and affect.  Nursing note  and vitals reviewed.  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      Dyslipidemia   Relevant Orders   Lipid panel   Comprehensive metabolic panel   Diabetes mellitus type 2, uncontrolled, without complications (HCC) - Primary    Chronic, staying  uncontrolled. Trouble finding motivation to implement necessary diet and lifestyle changes needed to control diabetes.  Higher metformin dose limited by GI distress. Discussed option to increase amaryl - pt requests 3 more months of lifestyle changes prior to making changes. labwork next visit.       Relevant Orders   Comprehensive metabolic panel   Hemoglobin A1c   Microalbumin / creatinine urine ratio   DDD (degenerative disc disease), thoracic    By xray. Takes prn aleve.           Follow up plan: No Follow-up on file.

## 2015-01-22 NOTE — Progress Notes (Signed)
Pre visit review using our clinic review tool, if applicable. No additional management support is needed unless otherwise documented below in the visit note. 

## 2015-01-22 NOTE — Assessment & Plan Note (Signed)
By xray. Takes prn aleve.

## 2015-01-24 ENCOUNTER — Other Ambulatory Visit: Payer: Self-pay | Admitting: Family Medicine

## 2015-03-31 ENCOUNTER — Encounter: Payer: Self-pay | Admitting: Internal Medicine

## 2015-04-05 ENCOUNTER — Telehealth: Payer: 59 | Admitting: Family

## 2015-04-05 DIAGNOSIS — J069 Acute upper respiratory infection, unspecified: Secondary | ICD-10-CM

## 2015-04-05 MED ORDER — AZITHROMYCIN 250 MG PO TABS: ORAL_TABLET | ORAL | Status: DC

## 2015-04-05 MED ORDER — BENZONATATE 200 MG PO CAPS: 200.0000 mg | ORAL_CAPSULE | Freq: Three times a day (TID) | ORAL | Status: DC | PRN

## 2015-04-05 NOTE — Progress Notes (Signed)
We are sorry that you are not feeling well.  Here is how we plan to help!  Based on what you have shared with me it looks like you have sinusitis.  Sinusitis is inflammation and infection in the sinus cavities of the head.  Based on your presentation I believe you most likely have Acute Bacterial sinusitis.  This is an infection caused by bacteria and is treated with antibiotics.  I have prescribed Azithromyin 250 mg: two tables now and then one tablet daily for 4 additonal days  You may use an oral decongestant such as Mucinex D or if you have glaucoma or high blood pressure use plain Mucinex.  Saline nasal sprays help an can sefely be used as often as needed for congestion.  If you develop worsening sinus pain, fever or notice severe headache and vision changes, or if symptoms are not better after completion of antibiotic, please schedule an appointment with a health care provider  In addition you may use A non-prescription cough medication called Robitussin DAC. Take 2 teaspoons every 8 hours or Delsym: take 2 teaspoons every 12 hours., A non-prescription cough medication called Mucinex DM: take 2 tablets every 12 hours. and A prescription cough medication called Tessalon Perles 100mg . You may take 1-2 capsules every 8 hours as needed for your cough..  Sinus infections are not as easily transmitted as other respiratory infection, however we still recommend that you avoid close contact with loved ones, especially the very young and elderly.  Remember to wash your hands thoroughly throughout the day as this is the number one way to prevent the spread of infection!  Home Care:  Only take medications as instructed by your medical team.  Complete the entire course of an antibiotic.  Do not take these medications with alcohol.  A steam or ultrasonic humidifier can help congestion.  You can place a towel over your head and breathe in the steam from hot water coming from a faucet.  Avoid close contacts  especially the very young and the elderly.  Cover your mouth when you cough or sneeze.  Always remember to wash your hands.  Get Help Right Away If:  You develop worsening fever or sinus pain.  You develop a severe head ache or visual changes.  Your symptoms persist after you have completed your treatment plan.  Make sure you  Understand these instructions.  Will watch your condition.  Will get help right away if you are not doing well or get worse.  Your e-visit answers were reviewed by a board certified advanced clinical practitioner to complete your personal care plan.  Depending on the condition, your plan could have included both over the counter or prescription medications.  If there is a problem please reply  once you have received a response from your provider.  Your safety is important to [place].  If you have drug allergies check your prescription carefully.    You can use MyChart to ask questions about today's visit, request a non-urgent call back, or ask for a work or school excuse.  You will get an e-mail in the next two days asking about your experience.  I hope that your e-visit has been valuable and will speed your recovery. Thank you for using e-visits.

## 2015-04-18 LAB — HM MAMMOGRAPHY: HM MAMMO: NORMAL

## 2015-04-23 ENCOUNTER — Encounter: Payer: Self-pay | Admitting: Internal Medicine

## 2015-04-24 ENCOUNTER — Encounter: Payer: Self-pay | Admitting: *Deleted

## 2015-04-25 ENCOUNTER — Other Ambulatory Visit: Payer: Self-pay | Admitting: Family Medicine

## 2015-05-07 ENCOUNTER — Other Ambulatory Visit (INDEPENDENT_AMBULATORY_CARE_PROVIDER_SITE_OTHER): Payer: 59

## 2015-05-07 DIAGNOSIS — IMO0001 Reserved for inherently not codable concepts without codable children: Secondary | ICD-10-CM

## 2015-05-07 DIAGNOSIS — E1165 Type 2 diabetes mellitus with hyperglycemia: Secondary | ICD-10-CM | POA: Diagnosis not present

## 2015-05-07 DIAGNOSIS — E785 Hyperlipidemia, unspecified: Secondary | ICD-10-CM

## 2015-05-07 LAB — COMPREHENSIVE METABOLIC PANEL
ALBUMIN: 3.9 g/dL (ref 3.5–5.2)
ALK PHOS: 97 U/L (ref 39–117)
ALT: 52 U/L — ABNORMAL HIGH (ref 0–35)
AST: 37 U/L (ref 0–37)
BUN: 13 mg/dL (ref 6–23)
CO2: 29 mEq/L (ref 19–32)
CREATININE: 0.76 mg/dL (ref 0.40–1.20)
Calcium: 9.7 mg/dL (ref 8.4–10.5)
Chloride: 99 mEq/L (ref 96–112)
GFR: 80.59 mL/min (ref >60.00)
Glucose, Bld: 320 mg/dL — ABNORMAL HIGH (ref 70–99)
POTASSIUM: 4.3 meq/L (ref 3.5–5.1)
SODIUM: 136 meq/L (ref 135–145)
TOTAL PROTEIN: 7.1 g/dL (ref 6.0–8.3)
Total Bilirubin: 0.5 mg/dL (ref 0.2–1.2)

## 2015-05-07 LAB — LDL CHOLESTEROL, DIRECT: Direct LDL: 136 mg/dL

## 2015-05-07 LAB — LIPID PANEL
CHOLESTEROL: 196 mg/dL (ref 0–200)
HDL: 34.6 mg/dL — ABNORMAL LOW (ref >39.00)
NonHDL: 161.58
Total CHOL/HDL Ratio: 6
Triglycerides: 208 mg/dL — ABNORMAL HIGH (ref 0.0–149.0)
VLDL: 41.6 mg/dL — ABNORMAL HIGH (ref 0.0–40.0)

## 2015-05-07 LAB — MICROALBUMIN / CREATININE URINE RATIO
Creatinine,U: 87.9 mg/dL
Microalb Creat Ratio: 0.8 mg/g (ref 0.0–30.0)
Microalb, Ur: <0.7 mg/dL (ref 0.0–1.9)

## 2015-05-07 LAB — HEMOGLOBIN A1C: HEMOGLOBIN A1C: 8.5 % — AB (ref 4.6–6.5)

## 2015-05-10 ENCOUNTER — Other Ambulatory Visit: Payer: Self-pay | Admitting: Family Medicine

## 2015-05-12 ENCOUNTER — Ambulatory Visit (INDEPENDENT_AMBULATORY_CARE_PROVIDER_SITE_OTHER): Payer: 59 | Admitting: Family Medicine

## 2015-05-12 ENCOUNTER — Encounter: Payer: Self-pay | Admitting: Family Medicine

## 2015-05-12 VITALS — BP 118/60 | HR 80 | Temp 98.3°F | Wt 192.0 lb

## 2015-05-12 DIAGNOSIS — IMO0001 Reserved for inherently not codable concepts without codable children: Secondary | ICD-10-CM

## 2015-05-12 DIAGNOSIS — E785 Hyperlipidemia, unspecified: Secondary | ICD-10-CM

## 2015-05-12 DIAGNOSIS — E1165 Type 2 diabetes mellitus with hyperglycemia: Secondary | ICD-10-CM | POA: Diagnosis not present

## 2015-05-12 MED ORDER — GLIMEPIRIDE 4 MG PO TABS: 4.0000 mg | ORAL_TABLET | Freq: Every day | ORAL | Status: DC

## 2015-05-12 MED ORDER — ATORVASTATIN CALCIUM 20 MG PO TABS: 20.0000 mg | ORAL_TABLET | Freq: Every day | ORAL | Status: DC

## 2015-05-12 MED ORDER — SITAGLIPTIN PHOSPHATE 100 MG PO TABS: 100.0000 mg | ORAL_TABLET | Freq: Every day | ORAL | Status: DC

## 2015-05-12 NOTE — Patient Instructions (Addendum)
Continue higher amaryl dose.  Start atorvastatin 20mg  daily. Return after May 26th for physical.  Basic Carbohydrate Counting for Diabetes Mellitus Carbohydrate counting is a method for keeping track of the amount of carbohydrates you eat. Eating carbohydrates naturally increases the level of sugar (glucose) in your blood, so it is important for you to know the amount that is okay for you to have in every meal. Carbohydrate counting helps keep the level of glucose in your blood within normal limits. The amount of carbohydrates allowed is different for every person. A dietitian can help you calculate the amount that is right for you. Once you know the amount of carbohydrates you can have, you can count the carbohydrates in the foods you want to eat. Carbohydrates are found in the following foods:  Grains, such as breads and cereals.  Dried beans and soy products.  Starchy vegetables, such as potatoes, peas, and corn.  Fruit and fruit juices.  Milk and yogurt.  Sweets and snack foods, such as cake, cookies, candy, chips, soft drinks, and fruit drinks. CARBOHYDRATE COUNTING There are two ways to count the carbohydrates in your food. You can use either of the methods or a combination of both. Reading the "Nutrition Facts" on [place] The "Nutrition Facts" is an area that is included on the labels of almost all packaged food and beverages in the [place]. It includes the serving size of that food or beverage and information about the nutrients in each serving of the food, including the grams (g) of carbohydrate per serving.  Decide the number of servings of this food or beverage that you will be able to eat or drink. Multiply that number of servings by the number of grams of carbohydrate that is listed on the label for that serving. The total will be the amount of carbohydrates you will be having when you eat or drink this food or beverage. Learning Standard Serving Sizes of Food When  you eat food that is not packaged or does not include "Nutrition Facts" on the label, you need to measure the servings in order to count the amount of carbohydrates.A serving of most carbohydrate-rich foods contains about 15 g of carbohydrates. The following list includes serving sizes of carbohydrate-rich foods that provide 15 g ofcarbohydrate per serving:   1 slice of bread (1 oz) or 1 six-inch tortilla.    of a hamburger bun or English muffin.  4-6 crackers.   cup unsweetened dry cereal.    cup hot cereal.   cup rice or pasta.    cup mashed potatoes or  of a large baked potato.  1 cup fresh fruit or one small piece of fruit.    cup canned or frozen fruit or fruit juice.  1 cup milk.   cup plain fat-free yogurt or yogurt sweetened with artificial sweeteners.   cup cooked dried beans or starchy vegetable, such as peas, corn, or potatoes.  Decide the number of standard-size servings that you will eat. Multiply that number of servings by 15 (the grams of carbohydrates in that serving). For example, if you eat 2 cups of strawberries, you will have eaten 2 servings and 30 g of carbohydrates (2 servings x 15 g = 30 g). For foods such as soups and casseroles, in which more than one food is mixed in, you will need to count the carbohydrates in each food that is included. EXAMPLE OF CARBOHYDRATE COUNTING Sample Dinner  3 oz chicken breast.   cup of brown  rice.   cup of corn.  1 cup milk.   1 cup strawberries with sugar-free whipped topping.  Carbohydrate Calculation Step 1: Identify the foods that contain carbohydrates:   Rice.   Corn.   Milk.   Strawberries. Step 2:Calculate the number of servings eaten of each:   2 servings of rice.   1 serving of corn.   1 serving of milk.   1 serving of strawberries. Step 3: Multiply each of those number of servings by 15 g:   2 servings of rice x 15 g = 30 g.   1 serving of corn x 15 g = 15 g.    1 serving of milk x 15 g = 15 g.   1 serving of strawberries x 15 g = 15 g. Step 4: Add together all of the amounts to find the total grams of carbohydrates eaten: 30 g + 15 g + 15 g + 15 g = 75 g.   This information is not intended to replace advice given to you by your health care provider. Make sure you discuss any questions you have with your health care provider.   Document Released: 04/05/2005 Document Revised: 04/26/2014 Document Reviewed: 03/02/2013 Elsevier Interactive Patient Education [employer].

## 2015-05-12 NOTE — Progress Notes (Signed)
Pre visit review using our clinic review tool, if applicable. No additional management support is needed unless otherwise documented below in the visit note. 

## 2015-05-12 NOTE — Progress Notes (Signed)
BP 118/60 mmHg  Pulse 80  Temp(Src) 98.3 F (36.8 C) (Oral)  Wt 192 lb (87.091 kg)   CC: 3-4 mo f/u visit  Subjective:    Patient ID: Betty Sanders, female    DOB: 09/08/1945, 70 y.o.   MRN: [id number]  HPI: Betty Sanders is a 70 y.o. female presenting on 05/12/2015 for Follow-up   Ongoing work stress. Has decided to retire 08/2015.  Back trouble and colds.   DM - regularly does check sugars - 187 this morning fasting. Compliant with antihyperglycemic regimen which includes: januvia 100, metformin 500 bid and amaryl 41m. Struggling with healthy food choices. Higher metformin caused GI distress. Denies low sugars or hypoglycemic symptoms.Completed DSME 07/2014. Denies paresthesias. Last diabetic eye exam 09/2014. Pneumovax: 2014. Prevnar: 2016.  Lab Results  Component Value Date   HGBA1C 8.5* 05/07/2015   Diabetic Foot Exam - Simple   Simple Foot Form  Diabetic Foot exam was performed with the following findings:  Yes 05/12/2015  8:47 AM  Visual Inspection  No deformities, no ulcerations, no other skin breakdown bilaterally:  Yes  Sensation Testing  Intact to touch and monofilament testing bilaterally:  Yes  Pulse Check  Posterior Tibialis and Dorsalis pulse intact bilaterally:  Yes  Comments       HLD - LDL not at goal. Has never been on statin.   Relevant past medical, surgical, family and social history reviewed and updated as indicated. Interim medical history since our last visit reviewed. Allergies and medications reviewed and updated. Current Outpatient Prescriptions on File Prior to Visit  Medication Sig  . aspirin (ASPIRIN EC) 81 MG EC tablet Take 81 mg by mouth daily. Swallow whole.  .Marland KitchenBAYER CONTOUR NEXT TEST test strip CHECK SUGARS TWICE DAILY. USE AS INSTRUCTED.  .Marland KitchenBlood Glucose Monitoring Suppl (ONE TOUCH ULTRA SYSTEM KIT) W/DEVICE KIT 1 kit by Does not apply route once.  .Marland Kitchenglucose blood test strip 250.02, check sugars daily.  Use as instructed  (Patient taking differently: 250.02, check sugars daily.  Use as instructed (Contour))  . metFORMIN (GLUCOPHAGE) 500 MG tablet Take 1 tablet (500 mg total) by mouth twice daily with a meal.  . naproxen (NAPROSYN) 500 MG tablet Take one po bid x 1 week then prn pain, take with food  . vitamin C (ASCORBIC ACID) 500 MG tablet Take 500 mg by mouth as needed.   No current facility-administered medications on file prior to visit.    Review of Systems Per HPI unless specifically indicated in ROS section     Objective:    BP 118/60 mmHg  Pulse 80  Temp(Src) 98.3 F (36.8 C) (Oral)  Wt 192 lb (87.091 kg)  Wt Readings from Last 3 Encounters:  05/12/15 192 lb (87.091 kg)  01/22/15 194 lb 12 oz (88.338 kg)  09/12/14 190 lb (86.183 kg)    Physical Exam  Constitutional: She appears well-developed and well-nourished. No distress.  HENT:  Head: Normocephalic and atraumatic.  Right Ear: External ear normal.  Left Ear: External ear normal.  Nose: Nose normal.  Mouth/Throat: Oropharynx is clear and moist. No oropharyngeal exudate.  Eyes: Conjunctivae and EOM are normal. Pupils are equal, round, and reactive to light. No scleral icterus.  Neck: Normal range of motion. Neck supple.  Cardiovascular: Normal rate, regular rhythm, normal heart sounds and intact distal pulses.   No murmur heard. Pulmonary/Chest: Effort normal and breath sounds normal. No respiratory distress. She has no wheezes. She has no rales.  Musculoskeletal: She exhibits no edema.  See HPI for foot exam if done  Lymphadenopathy:    She has no cervical adenopathy.  Skin: Skin is warm and dry. No rash noted.  Psychiatric: She has a normal mood and affect.  Nursing note and vitals reviewed.  Results for orders placed or performed in visit on 05/07/15  Lipid panel  Result Value Ref Range   Cholesterol 196 0 - 200 mg/dL   Triglycerides 208.0 (H) 0.0 - 149.0 mg/dL   HDL 34.60 (L) >39.00 mg/dL   VLDL 41.6 (H) 0.0 - 40.0 mg/dL    Total CHOL/HDL Ratio 6    NonHDL 161.58   Comprehensive metabolic panel  Result Value Ref Range   Sodium 136 135 - 145 mEq/L   Potassium 4.3 3.5 - 5.1 mEq/L   Chloride 99 96 - 112 mEq/L   CO2 29 19 - 32 mEq/L   Glucose, Bld 320 (H) 70 - 99 mg/dL   BUN 13 6 - 23 mg/dL   Creatinine, Ser 0.76 0.40 - 1.20 mg/dL   Total Bilirubin 0.5 0.2 - 1.2 mg/dL   Alkaline Phosphatase 97 39 - 117 U/L   AST 37 0 - 37 U/L   ALT 52 (H) 0 - 35 U/L   Total Protein 7.1 6.0 - 8.3 g/dL   Albumin 3.9 3.5 - 5.2 g/dL   Calcium 9.7 8.4 - 10.5 mg/dL   GFR 80.59 >60.00 mL/min  Hemoglobin A1c  Result Value Ref Range   Hgb A1c MFr Bld 8.5 (H) 4.6 - 6.5 %  Microalbumin / creatinine urine ratio  Result Value Ref Range   Microalb, Ur <0.7 0.0 - 1.9 mg/dL   Creatinine,U 87.9 mg/dL   Microalb Creat Ratio 0.8 0.0 - 30.0 mg/g  LDL cholesterol, direct  Result Value Ref Range   Direct LDL 136.0 mg/dL      Assessment & Plan:   Problem List Items Addressed This Visit    Dyslipidemia    Chronic, agrees to start statin. Atorvastatin 63m sent to pharmacy.      Relevant Medications   atorvastatin (LIPITOR) 20 MG tablet   Diabetes mellitus type 2, uncontrolled, without complications (HCC) - Primary    Chronic, deteriorated. Pt struggling with staying on track. Will increase amaryl to 442mdaily, will continue other meds. Planning on retiring mid this year.  RTC 4 mo CPE and f/u DM.      Relevant Medications   glimepiride (AMARYL) 4 MG tablet   atorvastatin (LIPITOR) 20 MG tablet   sitaGLIPtin (JANUVIA) 100 MG tablet       Follow up plan: Return in about 4 months (around 09/12/2015), or as needed, for annual exam, prior fasting for blood work.

## 2015-05-12 NOTE — Assessment & Plan Note (Signed)
Chronic, agrees to start statin. Atorvastatin 20mg  sent to pharmacy.

## 2015-05-12 NOTE — Assessment & Plan Note (Signed)
Chronic, deteriorated. Pt struggling with staying on track. Will increase amaryl to 4mg  daily, will continue other meds. Planning on retiring mid this year.  RTC 4 mo CPE and f/u DM.

## 2015-05-20 ENCOUNTER — Ambulatory Visit (AMBULATORY_SURGERY_CENTER): Payer: Self-pay | Admitting: *Deleted

## 2015-05-20 VITALS — Ht 64.0 in | Wt 192.0 lb

## 2015-05-20 DIAGNOSIS — Z86010 Personal history of colonic polyps: Secondary | ICD-10-CM

## 2015-05-20 NOTE — Progress Notes (Signed)
No egg or soy allergy known to patient  No issues with past sedation with any surgeries  or procedures, no intubation problems  No diet pills per patient No home 02 use per patient  No blood thinners per patient    

## 2015-05-21 HISTORY — PX: COLONOSCOPY: SHX174

## 2015-05-22 DIAGNOSIS — L300 Nummular dermatitis: Secondary | ICD-10-CM | POA: Diagnosis not present

## 2015-05-22 DIAGNOSIS — L400 Psoriasis vulgaris: Secondary | ICD-10-CM | POA: Diagnosis not present

## 2015-05-22 DIAGNOSIS — L820 Inflamed seborrheic keratosis: Secondary | ICD-10-CM | POA: Diagnosis not present

## 2015-06-03 ENCOUNTER — Ambulatory Visit (AMBULATORY_SURGERY_CENTER): Payer: 59 | Admitting: Internal Medicine

## 2015-06-03 ENCOUNTER — Encounter: Payer: Self-pay | Admitting: Internal Medicine

## 2015-06-03 VITALS — BP 112/64 | HR 51 | Temp 97.8°F | Resp 21 | Ht 64.0 in | Wt 192.0 lb

## 2015-06-03 DIAGNOSIS — Z86010 Personal history of colonic polyps: Secondary | ICD-10-CM

## 2015-06-03 MED ORDER — SODIUM CHLORIDE 0.9 % IV SOLN: 500.0000 mL | INTRAVENOUS | Status: DC

## 2015-06-03 NOTE — Patient Instructions (Addendum)
No polyps today!  You do have a condition called diverticulosis - common and not usually a problem. Please read the handout provided.  Next routine colonoscopy in 10 years - 2027  I appreciate the opportunity to care for you. Gatha Mayer, MD, FACG YOU HAD AN ENDOSCOPIC PROCEDURE TODAY AT [facility]:   Refer to the procedure report that was given to you for any specific questions about what was found during the examination.  If the procedure report does not answer your questions, please call your gastroenterologist to clarify.  If you requested that your care partner not be given the details of your procedure findings, then the procedure report has been included in a sealed envelope for you to review at your convenience later.  YOU SHOULD EXPECT: Some feelings of bloating in the abdomen. Passage of more gas than usual.  Walking can help get rid of the air that was put into your GI tract during the procedure and reduce the bloating. If you had a lower endoscopy (such as a colonoscopy or flexible sigmoidoscopy) you may notice spotting of blood in your stool or on the toilet paper. If you underwent a bowel prep for your procedure, you may not have a normal bowel movement for a few days.  Please Note:  You might notice some irritation and congestion in your nose or some drainage.  This is from the oxygen used during your procedure.  There is no need for concern and it should clear up in a day or so.  SYMPTOMS TO REPORT IMMEDIATELY:   Following lower endoscopy (colonoscopy or flexible sigmoidoscopy):  Excessive amounts of blood in the stool  Significant tenderness or worsening of abdominal pains  Swelling of the abdomen that is new, acute  Fever of 100F or higher  \ For urgent or emergent issues, a gastroenterologist can be reached at any hour by calling [phone].   DIET: Your first meal following the procedure should be a small meal and then it is ok to  progress to your normal diet. Heavy or fried foods are harder to digest and may make you feel nauseous or bloated.  Likewise, meals heavy in dairy and vegetables can increase bloating.  Drink plenty of fluids but you should avoid alcoholic beverages for 24 hours.  ACTIVITY:  You should plan to take it easy for the rest of today and you should NOT DRIVE or use heavy machinery until tomorrow (because of the sedation medicines used during the test).    FOLLOW UP: Our staff will call the number listed on your records the next business day following your procedure to check on you and address any questions or concerns that you may have regarding the information given to you following your procedure. If we do not reach you, we will leave a message.  However, if you are feeling well and you are not experiencing any problems, there is no need to return our call.  We will assume that you have returned to your regular daily activities without incident.  If any biopsies were taken you will be contacted by phone or by letter within the next 1-3 weeks.  Please call us at [phone] if you have not heard about the biopsies in 3 weeks.    SIGNATURES/CONFIDENTIALITY: You and/or your care partner have signed paperwork which will be entered into your electronic medical record.  These signatures attest to the fact that that the information above on your After Visit Summary  has been reviewed and is understood.  Full responsibility of the confidentiality of this discharge information lies with you and/or your care-partner.  Diverticulosis information given.  Recall 10 years-2027.

## 2015-06-03 NOTE — Op Note (Signed)
[place]  [address] [place], [id number]   COLONOSCOPY PROCEDURE REPORT  PATIENT: Betty, Sanders  MR#: [id number] BIRTHDATE: 03-28-1946 , 4  yrs. old GENDER: female ENDOSCOPIST: Gatha Mayer, MD, [facility] PROCEDURE DATE:  06/03/2015 PROCEDURE:   Colonoscopy, surveillance First Screening Colonoscopy - Avg.  risk and is 50 yrs.  old or older - No.  Prior Negative Screening - Now for repeat screening. N/A  History of Adenoma - Now for follow-up colonoscopy & has been > or = to 3 yrs.  Yes hx of adenoma.  Has been 3 or more years since last colonoscopy.  Polyps removed today? No Recommend repeat exam, <10 yrs? No ASA CLASS:   Class III INDICATIONS:Surveillance due to prior colonic neoplasia and PH Colon Adenoma. MEDICATIONS: Propofol 200 mg IV and Monitored anesthesia care  DESCRIPTION OF PROCEDURE:   After the risks benefits and alternatives of the procedure were thoroughly explained, informed consent was obtained.  The digital rectal exam revealed no abnormalities of the rectum.   The [id number]  endoscope was introduced through the anus and advanced to the cecum, which was identified by both the appendix and ileocecal valve. No adverse events experienced.   The quality of the prep was good.  (MiraLax was used)  The instrument was then slowly withdrawn as the colon was fully examined. Estimated blood loss is zero unless otherwise noted in this procedure report.      COLON FINDINGS: There was diverticulosis noted in the sigmoid colon. The examination was otherwise normal.  Retroflexed views revealed no abnormalities. The time to cecum = 2.5 Withdrawal time = 8.8 The scope was withdrawn and the procedure completed. COMPLICATIONS: There were no immediate complications.  ENDOSCOPIC IMPRESSION: 1.   Diverticulosis was noted in the sigmoid colon 2.   The examination was otherwise normal  RECOMMENDATIONS: Repeat colonoscopy 10 years.  Hx 1  adenoma < 1 cm in 2011  eSigned:  Gatha Mayer, MD, [facility] 06/03/2015 1:43 PM   cc: The Patient

## 2015-06-03 NOTE — Progress Notes (Signed)
Patient awakening,vss,report to rn 

## 2015-06-04 ENCOUNTER — Encounter: Payer: Self-pay | Admitting: Family Medicine

## 2015-06-04 ENCOUNTER — Telehealth: Payer: Self-pay

## 2015-06-04 NOTE — Telephone Encounter (Signed)
  Follow up Call-  Call back number 06/03/2015  Post procedure Call Back phone  # [phone]  Permission to leave phone message Yes     Patient questions:  Do you have a fever, pain , or abdominal swelling? No. Pain Score  0 *  Have you tolerated food without any problems? Yes.    Have you been able to return to your normal activities? Yes.    Do you have any questions about your discharge instructions: Diet   No. Medications  No. Follow up visit  No.  Do you have questions or concerns about your Care? No.  Actions: * If pain score is 4 or above: No action needed, pain <4.

## 2015-07-02 DIAGNOSIS — L300 Nummular dermatitis: Secondary | ICD-10-CM | POA: Diagnosis not present

## 2015-10-03 ENCOUNTER — Other Ambulatory Visit: Payer: 59

## 2015-10-10 ENCOUNTER — Encounter: Payer: 59 | Admitting: Family Medicine

## 2015-10-27 ENCOUNTER — Other Ambulatory Visit: Payer: Self-pay | Admitting: Family Medicine

## 2015-10-27 DIAGNOSIS — E1165 Type 2 diabetes mellitus with hyperglycemia: Principal | ICD-10-CM

## 2015-10-27 DIAGNOSIS — IMO0001 Reserved for inherently not codable concepts without codable children: Secondary | ICD-10-CM

## 2015-10-27 DIAGNOSIS — E785 Hyperlipidemia, unspecified: Secondary | ICD-10-CM

## 2015-10-27 DIAGNOSIS — Z1159 Encounter for screening for other viral diseases: Secondary | ICD-10-CM

## 2015-10-28 ENCOUNTER — Other Ambulatory Visit (INDEPENDENT_AMBULATORY_CARE_PROVIDER_SITE_OTHER): Payer: 59

## 2015-10-28 DIAGNOSIS — E785 Hyperlipidemia, unspecified: Secondary | ICD-10-CM | POA: Diagnosis not present

## 2015-10-28 DIAGNOSIS — Z1159 Encounter for screening for other viral diseases: Secondary | ICD-10-CM | POA: Diagnosis not present

## 2015-10-28 DIAGNOSIS — E1165 Type 2 diabetes mellitus with hyperglycemia: Secondary | ICD-10-CM

## 2015-10-28 DIAGNOSIS — IMO0001 Reserved for inherently not codable concepts without codable children: Secondary | ICD-10-CM

## 2015-10-28 LAB — COMPREHENSIVE METABOLIC PANEL
ALT: 71 U/L — AB (ref 0–35)
AST: 58 U/L — AB (ref 0–37)
Albumin: 4.1 g/dL (ref 3.5–5.2)
Alkaline Phosphatase: 88 U/L (ref 39–117)
BILIRUBIN TOTAL: 0.7 mg/dL (ref 0.2–1.2)
BUN: 14 mg/dL (ref 6–23)
CHLORIDE: 102 meq/L (ref 96–112)
CO2: 26 meq/L (ref 19–32)
CREATININE: 0.74 mg/dL (ref 0.40–1.20)
Calcium: 9.7 mg/dL (ref 8.4–10.5)
GFR: 82.99 mL/min (ref >60.00)
Glucose, Bld: 233 mg/dL — ABNORMAL HIGH (ref 70–99)
Potassium: 4 mEq/L (ref 3.5–5.1)
SODIUM: 136 meq/L (ref 135–145)
Total Protein: 7.2 g/dL (ref 6.0–8.3)

## 2015-10-28 LAB — LIPID PANEL
CHOL/HDL RATIO: 6
Cholesterol: 204 mg/dL — ABNORMAL HIGH (ref 0–200)
HDL: 35.9 mg/dL — ABNORMAL LOW (ref >39.00)
LDL CALC: 137 mg/dL — AB (ref 0–99)
NONHDL: 168.45
TRIGLYCERIDES: 155 mg/dL — AB (ref 0.0–149.0)
VLDL: 31 mg/dL (ref 0.0–40.0)

## 2015-10-28 LAB — HEMOGLOBIN A1C: HEMOGLOBIN A1C: 9.1 % — AB (ref 4.6–6.5)

## 2015-10-30 LAB — HEPATITIS C ANTIBODY: HCV Ab: NEGATIVE

## 2015-10-31 ENCOUNTER — Encounter: Payer: Self-pay | Admitting: Family Medicine

## 2015-10-31 ENCOUNTER — Ambulatory Visit (INDEPENDENT_AMBULATORY_CARE_PROVIDER_SITE_OTHER): Payer: 59 | Admitting: Family Medicine

## 2015-10-31 VITALS — BP 102/60 | HR 67 | Ht 64.0 in | Wt 186.4 lb

## 2015-10-31 DIAGNOSIS — M8580 Other specified disorders of bone density and structure, unspecified site: Secondary | ICD-10-CM

## 2015-10-31 DIAGNOSIS — R7401 Elevation of levels of liver transaminase levels: Secondary | ICD-10-CM

## 2015-10-31 DIAGNOSIS — E785 Hyperlipidemia, unspecified: Secondary | ICD-10-CM

## 2015-10-31 DIAGNOSIS — E1165 Type 2 diabetes mellitus with hyperglycemia: Secondary | ICD-10-CM | POA: Diagnosis not present

## 2015-10-31 DIAGNOSIS — Z0001 Encounter for general adult medical examination with abnormal findings: Secondary | ICD-10-CM

## 2015-10-31 DIAGNOSIS — K589 Irritable bowel syndrome without diarrhea: Secondary | ICD-10-CM | POA: Insufficient documentation

## 2015-10-31 DIAGNOSIS — R740 Nonspecific elevation of levels of transaminase and lactic acid dehydrogenase [LDH]: Secondary | ICD-10-CM | POA: Diagnosis not present

## 2015-10-31 DIAGNOSIS — Z0000 Encounter for general adult medical examination without abnormal findings: Secondary | ICD-10-CM

## 2015-10-31 DIAGNOSIS — K760 Fatty (change of) liver, not elsewhere classified: Secondary | ICD-10-CM | POA: Insufficient documentation

## 2015-10-31 DIAGNOSIS — IMO0001 Reserved for inherently not codable concepts without codable children: Secondary | ICD-10-CM

## 2015-10-31 DIAGNOSIS — Z566 Other physical and mental strain related to work: Secondary | ICD-10-CM

## 2015-10-31 MED ORDER — SITAGLIPTIN PHOSPHATE 100 MG PO TABS: 100.0000 mg | ORAL_TABLET | Freq: Every day | ORAL | Status: DC

## 2015-10-31 MED ORDER — ATORVASTATIN CALCIUM 20 MG PO TABS: 20.0000 mg | ORAL_TABLET | Freq: Every day | ORAL | Status: DC

## 2015-10-31 MED ORDER — METFORMIN HCL 500 MG PO TABS: ORAL_TABLET | ORAL | Status: DC

## 2015-10-31 NOTE — Progress Notes (Signed)
BP 102/60 mmHg  Pulse 67  Ht '5\' 4"'  (1.626 m)  Wt 186 lb 6.4 oz (84.55 kg)  BMI 31.98 kg/m2  SpO2 97%   CC: CPE  Subjective:    Patient ID: Betty Sanders, female    DOB: 01-25-1946, 70 y.o.   MRN: [id number]  HPI: Betty Sanders is a 70 y.o. female presenting on 10/31/2015 for Annual Exam   Hasn't retired yet. Ongoing stressors.   DM - sugars staying elevated despite compliance with regimen. She endorses she is eating well and hasn't changed activity level. Has not had insulin shots yet.   Ongoing sinus congestion ?chronic.  Noticing some alternating constipation/diarrhea. ?IBS. H/o this in college.   Ongoing mood trouble due to work stress. Mood swings and more irritable.  Preventative: COLONOSCOPY Date: 05/2015 diverticulosis, rpt 10 yrs [facility]) Mammogram 03/2015, Bir[facility] 1. Well woman exam - always normal.last 04/2013. [facility] (aunt) GYN cancer, unsure type. Discussed, will space out to Q3 yrs to 2018.  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  Activity: decreased recently - kayak, restarting treadmill, wears fit bit - wants to get to 10000steps/day  Diet: My.fitness.pal 1200cal/day, good water, fruits/vegetables daily  Relevant past medical, surgical, family and social history reviewed and updated as indicated. Interim medical history since our last visit reviewed. Allergies and medications reviewed and updated. Current Outpatient Prescriptions on File Prior to Visit  Medication Sig  . aspirin (ASPIRIN EC) 81 MG EC tablet Take 81 mg by mouth daily. Reported on 06/03/2015  . BAYER CONTOUR NEXT TEST test strip CHECK SUGARS TWICE DAILY. USE AS INSTRUCTED.  Marland Kitchen Blood Glucose Monitoring Suppl (ONE  TOUCH ULTRA SYSTEM KIT) W/DEVICE KIT 1 kit by Does not apply route once.  Marland Kitchen glimepiride (AMARYL) 4 MG tablet Take 1 tablet (4 mg total) by mouth daily with breakfast.  . glucose blood test strip 250.02, check sugars daily.  Use as instructed (Patient taking differently: 250.02, check sugars daily.  Use as instructed (Contour))  . naproxen (NAPROSYN) 500 MG tablet Take one po bid x 1 week then prn pain, take with food  . vitamin C (ASCORBIC ACID) 500 MG tablet Take 500 mg by mouth as needed.   No current facility-administered medications on file prior to visit.    Review of Systems  Constitutional: Negative for fever, chills, activity change, appetite change, fatigue and unexpected weight change.  HENT: Positive for congestion. Negative for hearing loss.   Eyes: Negative for visual disturbance.  Respiratory: Positive for cough. Negative for chest tightness, shortness of breath and wheezing.   Cardiovascular: Negative for chest pain, palpitations and leg swelling.  Gastrointestinal: Positive for nausea (rare), abdominal pain (mild), diarrhea and constipation. Negative for vomiting, blood in stool and abdominal distention.  Genitourinary: Negative for hematuria and difficulty urinating.  Musculoskeletal: Negative for myalgias, arthralgias and neck pain.  Skin: Negative for rash.  Neurological: Negative for dizziness, seizures, syncope and headaches.  Hematological: Negative for adenopathy. Does not bruise/bleed easily.  Psychiatric/Behavioral: Negative for dysphoric mood. The patient is not nervous/anxious.    Per HPI unless specifically indicated in ROS section     Objective:    BP 102/60 mmHg  Pulse 67  Ht '5\' 4"'  (1.626  m)  Wt 186 lb 6.4 oz (84.55 kg)  BMI 31.98 kg/m2  SpO2 97%  Wt Readings from Last 3 Encounters:  10/31/15 186 lb 6.4 oz (84.55 kg)  06/03/15 192 lb (87.091 kg)  05/20/15 192 lb (87.091 kg)    Physical Exam  Constitutional: She is oriented to person, place, and  time. She appears well-developed and well-nourished. No distress.  HENT:  Head: Normocephalic and atraumatic.  Right Ear: Hearing, tympanic membrane, external ear and ear canal normal.  Left Ear: Hearing, tympanic membrane, external ear and ear canal normal.  Nose: Nose normal.  Mouth/Throat: Uvula is midline, oropharynx is clear and moist and mucous membranes are normal. No oropharyngeal exudate, posterior oropharyngeal edema or posterior oropharyngeal erythema.  Eyes: Conjunctivae and EOM are normal. Pupils are equal, round, and reactive to light. No scleral icterus.  Neck: Normal range of motion. Neck supple. Carotid bruit is not present. No thyromegaly present.  Cardiovascular: Normal rate, regular rhythm, normal heart sounds and intact distal pulses.   No murmur heard. Pulses:      Radial pulses are 2+ on the right side, and 2+ on the left side.  Pulmonary/Chest: Effort normal and breath sounds normal. No respiratory distress. She has no wheezes. She has no rales.  Abdominal: Soft. Bowel sounds are normal. She exhibits no distension and no mass. There is no tenderness. There is no rebound and no guarding.  Musculoskeletal: Normal range of motion. She exhibits no edema.  Lymphadenopathy:    She has no cervical adenopathy.  Neurological: She is alert and oriented to person, place, and time.  CN grossly intact, station and gait intact  Skin: Skin is warm and dry. No rash noted.  Psychiatric: She has a normal mood and affect. Her behavior is normal. Judgment and thought content normal.  Nursing note and vitals reviewed.  Results for orders placed or performed in visit on 10/28/15  Comprehensive metabolic panel  Result Value Ref Range   Sodium 136 135 - 145 mEq/L   Potassium 4.0 3.5 - 5.1 mEq/L   Chloride 102 96 - 112 mEq/L   CO2 26 19 - 32 mEq/L   Glucose, Bld 233 (H) 70 - 99 mg/dL   BUN 14 6 - 23 mg/dL   Creatinine, Ser 0.74 0.40 - 1.20 mg/dL   Total Bilirubin 0.7 0.2 - 1.2 mg/dL     Alkaline Phosphatase 88 39 - 117 U/L   AST 58 (H) 0 - 37 U/L   ALT 71 (H) 0 - 35 U/L   Total Protein 7.2 6.0 - 8.3 g/dL   Albumin 4.1 3.5 - 5.2 g/dL   Calcium 9.7 8.4 - 10.5 mg/dL   GFR 82.99 >60.00 mL/min  Lipid panel  Result Value Ref Range   Cholesterol 204 (H) 0 - 200 mg/dL   Triglycerides 155.0 (H) 0.0 - 149.0 mg/dL   HDL 35.90 (L) >39.00 mg/dL   VLDL 31.0 0.0 - 40.0 mg/dL   LDL Cholesterol 137 (H) 0 - 99 mg/dL   Total CHOL/HDL Ratio 6    NonHDL 168.45   Hemoglobin A1c  Result Value Ref Range   Hgb A1c MFr Bld 9.1 (H) 4.6 - 6.5 %  Hepatitis C antibody  Result Value Ref Range   HCV Ab NEGATIVE NEGATIVE      Assessment & Plan:   Problem List Items Addressed This Visit    Healthcare maintenance - Primary    Preventative protocols reviewed and updated unless pt declined. Discussed healthy diet and  lifestyle.       Diabetes mellitus type 2, uncontrolled, without complications ([place])    Again deteriorated.  Check abd [place] to eval pancreas. Anticipate natural progression of diabetes - discussed will likely transition from amaryl to insulin pending [place] results.  RTC 3 mo f/u visit.       Relevant Medications   metFORMIN (GLUCOPHAGE) 500 MG tablet   sitaGLIPtin (JANUVIA) 100 MG tablet   atorvastatin (LIPITOR) 20 MG tablet   Other Relevant Orders   US Abdomen Complete   Dyslipidemia    Chronic. Has not been taking atoravastatin. Agrees to restart 59m daily.      Relevant Medications   atorvastatin (LIPITOR) 20 MG tablet   Osteopenia    Discussed cal/vit D intake.       Work-related stress    Discussed with patient. Situational related to limbo status at work - wanting to retire over last 2 yrs. Pt will notify me if sxs becoming more bothersome to consider medication.      Transaminitis    Anticipate due to uncontrolled sugar. ?NASH. Ordered abd [place].       Relevant Orders   [place] Complete   IBS (irritable bowel syndrome)    Mixed type. Discussed  stress relation. rec start probiotic regularly.          Follow up plan: Return in about 3 months (around 01/31/2016) for follow up visit.  JRia Bush MD

## 2015-10-31 NOTE — Assessment & Plan Note (Signed)
Discussed cal/vit D intake.  

## 2015-10-31 NOTE — Patient Instructions (Addendum)
Pass by our referral coordinators to schedule abdominal ultrasound.  Call your insurance about the shingles shot to see if it is covered or how much it would cost and where is cheaper (here or pharmacy).  If you want to receive here, call for nurse visit. Start probiotic regularly for what sounds like irritable bowel.  Restart lipitor 70m daily Return as needed or in 3 months for diabetes follow up.  Health Maintenance, Female Adopting a healthy lifestyle and getting preventive care can go a long way to promote health and wellness. Talk with your health care provider about what schedule of regular examinations is right for you. This is a good chance for you to check in with your provider about disease prevention and staying healthy. In between checkups, there are plenty of things you can do on your own. Experts have done a lot of research about which lifestyle changes and preventive measures are most likely to keep you healthy. Ask your health care provider for more information. WEIGHT AND DIET  Eat a healthy diet  Be sure to include plenty of vegetables, fruits, low-fat dairy products, and lean protein.  Do not eat a lot of foods high in solid fats, added sugars, or salt.  Get regular exercise. This is one of the most important things you can do for your health.  Most adults should exercise for at least 150 minutes each week. The exercise should increase your heart rate and make you sweat (moderate-intensity exercise).  Most adults should also do strengthening exercises at least twice a week. This is in addition to the moderate-intensity exercise.  Maintain a healthy weight  Body mass index (BMI) is a measurement that can be used to identify possible weight problems. It estimates body fat based on height and weight. Your health care provider can help determine your BMI and help you achieve or maintain a healthy weight.  For females 267years of age and older:   A BMI below 18.5 is  considered underweight.  A BMI of 18.5 to 24.9 is normal.  A BMI of 25 to 29.9 is considered overweight.  A BMI of 30 and above is considered obese.  Watch levels of cholesterol and blood lipids  You should start having your blood tested for lipids and cholesterol at 68years of age, then have this test every 5 years.  You may need to have your cholesterol levels checked more often if:  Your lipid or cholesterol levels are high.  You are older than 68years of age.  You are at high risk for heart disease.  CANCER SCREENING   Lung Cancer  Lung cancer screening is recommended for adults [id number] old who are at high risk for lung cancer because of a history of smoking.  A yearly low-dose CT scan of the lungs is recommended for people who:  Currently smoke.  Have quit within the past 15 years.  Have at least a 30-pack-year history of smoking. A pack year is smoking an average of one pack of cigarettes a day for 1 year.  Yearly screening should continue until it has been 15 years since you quit.  Yearly screening should stop if you develop a health problem that would prevent you from having lung cancer treatment.  Breast Cancer  Practice breast self-awareness. This means understanding how your breasts normally appear and feel.  It also means doing regular breast self-exams. Let your health care provider know about any changes, no matter how small.  If  you are in your 20s or 30s, you should have a clinical breast exam (CBE) by a health care provider every 1-3 years as part of a regular health exam.  If you are 76 or older, have a CBE every year. Also consider having a breast X-ray (mammogram) every year.  If you have a family history of breast cancer, talk to your health care provider about genetic screening.  If you are at high risk for breast cancer, talk to your health care provider about having an MRI and a mammogram every year.  Breast cancer gene (BRCA)  assessment is recommended for women who have family members with BRCA-related cancers. BRCA-related cancers include:  Breast.  Ovarian.  Tubal.  Peritoneal cancers.  Results of the assessment will determine the need for genetic counseling and BRCA1 and BRCA2 testing. Cervical Cancer Your health care provider may recommend that you be screened regularly for cancer of the pelvic organs (ovaries, uterus, and vagina). This screening involves a pelvic examination, including checking for microscopic changes to the surface of your cervix (Pap test). You may be encouraged to have this screening done every 3 years, beginning at age 32.  For women ages 52-65, health care providers may recommend pelvic exams and Pap testing every 3 years, or they may recommend the Pap and pelvic exam, combined with testing for human papilloma virus (HPV), every 5 years. Some types of HPV increase your risk of cervical cancer. Testing for HPV may also be done on women of any age with unclear Pap test results.  Other health care providers may not recommend any screening for nonpregnant women who are considered low risk for pelvic cancer and who do not have symptoms. Ask your health care provider if a screening pelvic exam is right for you.  If you have had past treatment for cervical cancer or a condition that could lead to cancer, you need Pap tests and screening for cancer for at least 20 years after your treatment. If Pap tests have been discontinued, your risk factors (such as having a new sexual partner) need to be reassessed to determine if screening should resume. Some women have medical problems that increase the chance of getting cervical cancer. In these cases, your health care provider may recommend more frequent screening and Pap tests. Colorectal Cancer  This type of cancer can be detected and often prevented.  Routine colorectal cancer screening usually begins at 68 years of age and continues through 68 years  of age.  Your health care provider may recommend screening at an earlier age if you have risk factors for colon cancer.  Your health care provider may also recommend using home test kits to check for hidden blood in the stool.  A small camera at the end of a tube can be used to examine your colon directly (sigmoidoscopy or colonoscopy). This is done to check for the earliest forms of colorectal cancer.  Routine screening usually begins at age 63.  Direct examination of the colon should be repeated every 5-10 years through 68 years of age. However, you may need to be screened more often if early forms of precancerous polyps or small growths are found. Skin Cancer  Check your skin from head to toe regularly.  Tell your health care provider about any new moles or changes in moles, especially if there is a change in a mole's shape or color.  Also tell your health care provider if you have a mole that is larger than the size  of a pencil eraser.  Always use sunscreen. Apply sunscreen liberally and repeatedly throughout the day.  Protect yourself by wearing long sleeves, pants, a wide-brimmed hat, and sunglasses whenever you are outside. HEART DISEASE, DIABETES, AND HIGH BLOOD PRESSURE   High blood pressure causes heart disease and increases the risk of stroke. High blood pressure is more likely to develop in:  People who have blood pressure in the high end of the normal range (130-139/85-89 mm Hg).  People who are overweight or obese.  People who are African American.  If you are 75-25 years of age, have your blood pressure checked every 3-5 years. If you are 22 years of age or older, have your blood pressure checked every year. You should have your blood pressure measured twice--once when you are at a hospital or clinic, and once when you are not at a hospital or clinic. Record the average of the two measurements. To check your blood pressure when you are not at a hospital or clinic, you  can use:  An automated blood pressure machine at a pharmacy.  A home blood pressure monitor.  If you are between 51 years and 74 years old, ask your health care provider if you should take aspirin to prevent strokes.  Have regular diabetes screenings. This involves taking a blood sample to check your fasting blood sugar level.  If you are at a normal weight and have a low risk for diabetes, have this test once every three years after 68 years of age.  If you are overweight and have a high risk for diabetes, consider being tested at a younger age or more often. PREVENTING INFECTION  Hepatitis B  If you have a higher risk for hepatitis B, you should be screened for this virus. You are considered at high risk for hepatitis B if:  You were born in a country where hepatitis B is common. Ask your health care provider which countries are considered high risk.  Your parents were born in a high-risk country, and you have not been immunized against hepatitis B (hepatitis B vaccine).  You have HIV or AIDS.  You use needles to inject street drugs.  You live with someone who has hepatitis B.  You have had sex with someone who has hepatitis B.  You get hemodialysis treatment.  You take certain medicines for conditions, including cancer, organ transplantation, and autoimmune conditions. Hepatitis C  Blood testing is recommended for:  Everyone born from [birth year] through 1963.  Anyone with known risk factors for hepatitis C. Sexually transmitted infections (STIs)  You should be screened for sexually transmitted infections (STIs) including gonorrhea and chlamydia if:  You are sexually active and are younger than 68 years of age.  You are older than 69 years of age and your health care provider tells you that you are at risk for this type of infection.  Your sexual activity has changed since you were last screened and you are at an increased risk for chlamydia or gonorrhea. Ask your health  care provider if you are at risk.  If you do not have HIV, but are at risk, it may be recommended that you take a prescription medicine daily to prevent HIV infection. This is called pre-exposure prophylaxis (PrEP). You are considered at risk if:  You are sexually active and do not regularly use condoms or know the HIV status of your partner(s).  You take drugs by injection.  You are sexually active with a partner who has  HIV. Talk with your health care provider about whether you are at high risk of being infected with HIV. If you choose to begin PrEP, you should first be tested for HIV. You should then be tested every 3 months for as long as you are taking PrEP.  PREGNANCY   If you are premenopausal and you may become pregnant, ask your health care provider about preconception counseling.  If you may become pregnant, take 400 to 800 micrograms (mcg) of folic acid every day.  If you want to prevent pregnancy, talk to your health care provider about birth control (contraception). OSTEOPOROSIS AND MENOPAUSE   Osteoporosis is a disease in which the bones lose minerals and strength with aging. This can result in serious bone fractures. Your risk for osteoporosis can be identified using a bone density scan.  If you are 72 years of age or older, or if you are at risk for osteoporosis and fractures, ask your health care provider if you should be screened.  Ask your health care provider whether you should take a calcium or vitamin D supplement to lower your risk for osteoporosis.  Menopause may have certain physical symptoms and risks.  Hormone replacement therapy may reduce some of these symptoms and risks. Talk to your health care provider about whether hormone replacement therapy is right for you.  HOME CARE INSTRUCTIONS   Schedule regular health, dental, and eye exams.  Stay current with your immunizations.   Do not use any tobacco products including cigarettes, chewing tobacco, or  electronic cigarettes.  If you are pregnant, do not drink alcohol.  If you are breastfeeding, limit how much and how often you drink alcohol.  Limit alcohol intake to no more than 1 drink per day for nonpregnant women. One drink equals 12 ounces of beer, 5 ounces of wine, or 1 ounces of hard liquor.  Do not use street drugs.  Do not share needles.  Ask your health care provider for help if you need support or information about quitting drugs.  Tell your health care provider if you often feel depressed.  Tell your health care provider if you have ever been abused or do not feel safe at home.   This information is not intended to replace advice given to you by your health care provider. Make sure you discuss any questions you have with your health care provider.   Document Released: 10/19/2010 Document Revised: 04/26/2014 Document Reviewed: 03/07/2013 Elsevier Interactive Patient Education [employer].

## 2015-10-31 NOTE — Assessment & Plan Note (Signed)
Chronic. Has not been taking atoravastatin. Agrees to restart 20mg  daily.

## 2015-10-31 NOTE — Assessment & Plan Note (Signed)
Anticipate due to uncontrolled sugar. ?NASH. Ordered abd [place] today.

## 2015-10-31 NOTE — Assessment & Plan Note (Signed)
Mixed type. Discussed stress relation. rec start probiotic regularly.

## 2015-10-31 NOTE — Assessment & Plan Note (Signed)
Discussed with patient. Situational related to limbo status at work - wanting to retire over last 2 yrs. Pt will notify me if sxs becoming more bothersome to consider medication.

## 2015-10-31 NOTE — Assessment & Plan Note (Signed)
Again deteriorated.  Check abd [place] to eval pancreas. Anticipate natural progression of diabetes - discussed will likely transition from amaryl to insulin pending [place] results.  RTC 3 mo f/u visit.

## 2015-10-31 NOTE — Assessment & Plan Note (Signed)
Preventative protocols reviewed and updated unless pt declined. Discussed healthy diet and lifestyle.  

## 2015-11-07 DIAGNOSIS — K760 Fatty (change of) liver, not elsewhere classified: Secondary | ICD-10-CM | POA: Diagnosis not present

## 2015-11-07 DIAGNOSIS — R740 Nonspecific elevation of levels of transaminase and lactic acid dehydrogenase [LDH]: Secondary | ICD-10-CM | POA: Diagnosis not present

## 2015-11-07 DIAGNOSIS — E1165 Type 2 diabetes mellitus with hyperglycemia: Secondary | ICD-10-CM | POA: Diagnosis not present

## 2015-11-07 DIAGNOSIS — K8020 Calculus of gallbladder without cholecystitis without obstruction: Secondary | ICD-10-CM | POA: Diagnosis not present

## 2015-11-23 ENCOUNTER — Telehealth: Payer: Self-pay | Admitting: Family Medicine

## 2015-11-23 DIAGNOSIS — K760 Fatty (change of) liver, not elsewhere classified: Secondary | ICD-10-CM

## 2015-11-23 NOTE — Telephone Encounter (Signed)
plz notify abd [place] returned showing one gallstone, as well as fatty liver. rec continue working towards sugar/cholesterol control. Lipitor will help.

## 2015-11-25 NOTE — Telephone Encounter (Signed)
Patient notified as instructed by telephone and verbalized understanding. 

## 2015-11-28 ENCOUNTER — Encounter: Payer: Self-pay | Admitting: Family Medicine

## 2015-12-26 DIAGNOSIS — E119 Type 2 diabetes mellitus without complications: Secondary | ICD-10-CM | POA: Diagnosis not present

## 2015-12-26 LAB — HM DIABETES EYE EXAM

## 2016-01-10 ENCOUNTER — Encounter: Payer: Self-pay | Admitting: Family Medicine

## 2016-02-02 ENCOUNTER — Ambulatory Visit: Payer: 59 | Admitting: Family Medicine

## 2016-03-30 ENCOUNTER — Other Ambulatory Visit: Payer: 59

## 2016-04-27 ENCOUNTER — Other Ambulatory Visit: Payer: Self-pay | Admitting: Family Medicine

## 2016-04-27 DIAGNOSIS — E1165 Type 2 diabetes mellitus with hyperglycemia: Principal | ICD-10-CM

## 2016-04-27 DIAGNOSIS — IMO0001 Reserved for inherently not codable concepts without codable children: Secondary | ICD-10-CM

## 2016-04-27 DIAGNOSIS — E785 Hyperlipidemia, unspecified: Secondary | ICD-10-CM

## 2016-04-28 ENCOUNTER — Other Ambulatory Visit (INDEPENDENT_AMBULATORY_CARE_PROVIDER_SITE_OTHER): Payer: PPO

## 2016-04-28 DIAGNOSIS — E1165 Type 2 diabetes mellitus with hyperglycemia: Secondary | ICD-10-CM | POA: Diagnosis not present

## 2016-04-28 DIAGNOSIS — E785 Hyperlipidemia, unspecified: Secondary | ICD-10-CM

## 2016-04-28 DIAGNOSIS — IMO0001 Reserved for inherently not codable concepts without codable children: Secondary | ICD-10-CM

## 2016-04-28 LAB — COMPREHENSIVE METABOLIC PANEL
ALT: 17 U/L (ref 0–35)
AST: 20 U/L (ref 0–37)
Albumin: 4 g/dL (ref 3.5–5.2)
Alkaline Phosphatase: 73 U/L (ref 39–117)
BILIRUBIN TOTAL: 0.5 mg/dL (ref 0.2–1.2)
BUN: 12 mg/dL (ref 6–23)
CO2: 29 meq/L (ref 19–32)
Calcium: 9.6 mg/dL (ref 8.4–10.5)
Chloride: 103 mEq/L (ref 96–112)
Creatinine, Ser: 0.68 mg/dL (ref 0.40–1.20)
GFR: 91.36 mL/min (ref >60.00)
GLUCOSE: 148 mg/dL — AB (ref 70–99)
Potassium: 4 mEq/L (ref 3.5–5.1)
SODIUM: 139 meq/L (ref 135–145)
Total Protein: 7.1 g/dL (ref 6.0–8.3)

## 2016-04-28 LAB — LDL CHOLESTEROL, DIRECT: Direct LDL: 69 mg/dL

## 2016-04-28 LAB — HEMOGLOBIN A1C: Hgb A1c MFr Bld: 6.5 % (ref 4.6–6.5)

## 2016-05-06 ENCOUNTER — Ambulatory Visit: Payer: 59 | Admitting: Family Medicine

## 2016-05-12 ENCOUNTER — Other Ambulatory Visit: Payer: Self-pay

## 2016-05-12 ENCOUNTER — Other Ambulatory Visit: Payer: Self-pay | Admitting: Family Medicine

## 2016-05-12 ENCOUNTER — Encounter: Payer: Self-pay | Admitting: Family Medicine

## 2016-05-12 ENCOUNTER — Ambulatory Visit (INDEPENDENT_AMBULATORY_CARE_PROVIDER_SITE_OTHER): Payer: PPO | Admitting: Family Medicine

## 2016-05-12 VITALS — BP 124/66 | HR 60 | Temp 97.6°F | Wt 176.2 lb

## 2016-05-12 DIAGNOSIS — R52 Pain, unspecified: Secondary | ICD-10-CM

## 2016-05-12 DIAGNOSIS — R21 Rash and other nonspecific skin eruption: Secondary | ICD-10-CM | POA: Insufficient documentation

## 2016-05-12 DIAGNOSIS — K760 Fatty (change of) liver, not elsewhere classified: Secondary | ICD-10-CM

## 2016-05-12 DIAGNOSIS — E785 Hyperlipidemia, unspecified: Secondary | ICD-10-CM

## 2016-05-12 DIAGNOSIS — Z566 Other physical and mental strain related to work: Secondary | ICD-10-CM

## 2016-05-12 DIAGNOSIS — E119 Type 2 diabetes mellitus without complications: Secondary | ICD-10-CM | POA: Diagnosis not present

## 2016-05-12 MED ORDER — METFORMIN HCL 500 MG PO TABS: ORAL_TABLET | ORAL | 1 refills | Status: DC

## 2016-05-12 MED ORDER — ONETOUCH DELICA LANCETS 33G MISC: 5 refills | Status: DC

## 2016-05-12 MED ORDER — ONETOUCH VERIO IQ SYSTEM W/DEVICE KIT: PACK | 0 refills | Status: AC

## 2016-05-12 MED ORDER — GLUCOSE BLOOD VI STRP: ORAL_STRIP | 5 refills | Status: DC

## 2016-05-12 MED ORDER — GLIMEPIRIDE 4 MG PO TABS: 4.0000 mg | ORAL_TABLET | Freq: Every day | ORAL | 1 refills | Status: DC

## 2016-05-12 MED ORDER — NAPROXEN 500 MG PO TABS: ORAL_TABLET | ORAL | 0 refills | Status: DC

## 2016-05-12 NOTE — Assessment & Plan Note (Signed)
Ongoing over last several months - ?statin related myalgias. Suggested trial off lipitor for 2 wks and if body aches improved, trial coQ10 with statin and update with effect. Consider changing to less potent statin.

## 2016-05-12 NOTE — Assessment & Plan Note (Signed)
Skin rash on palms and soles - ?tinea infection, however patient states she has been diagnosed by derm with psoriasis and imroved with steroid shot. Suggested return to derm for further management.

## 2016-05-12 NOTE — Assessment & Plan Note (Signed)
Improved with retirement. She has been taking care of husband.

## 2016-05-12 NOTE — Telephone Encounter (Signed)
Pt changing ins and pharmacies to [facility] and request new rx for onetouch verio IQ device,strips and lancets and refill on metformin, and amaryl. Advised pt done per protocol. Pt voiced understanding.

## 2016-05-12 NOTE — Assessment & Plan Note (Signed)
Chronic, stable. Continue lipitor - see below.

## 2016-05-12 NOTE — Progress Notes (Signed)
Pre visit review using our clinic review tool, if applicable. No additional management support is needed unless otherwise documented below in the visit note. 

## 2016-05-12 NOTE — Assessment & Plan Note (Signed)
Chronic, marked improvement - congratulated. Continue current regimen, healthy diet changes reviewed.

## 2016-05-12 NOTE — Progress Notes (Signed)
BP 124/66   Pulse 60   Temp 97.6 F (36.4 C) (Oral)   Wt 176 lb 4 oz (79.9 kg)   BMI 30.25 kg/m    CC: 36mof/u visit Subjective:    Patient ID: Betty Sanders female    DOB: [birth year] 71y.o.   MRN: [id number] HPI: SBLAYKE PINERAis a 71y.o. female presenting on 05/12/2016 for Follow-up   She retired Oct 2017. Husband had open heart surgery at the same time.   DM - regularly does check sugars fasting daily: 120-180. Compliant with antihyperglycemic regimen which includes: amaryl 478mdaily, metformin 50013mID, januvia 100m65mily.  Denies low sugars or hypoglycemic symptoms.  Denies paresthesias. Last diabetic eye exam 92017.  Pneumovax: 2014.  Prevnar: 2016. Started exercising, weight loss noted. 30 min aerobic daily.  Lab Results  Component Value Date   HGBA1C 6.5 04/28/2016   Diabetic Foot Exam - Simple   Simple Foot Form Diabetic Foot exam was performed with the following findings:  Yes 05/12/2016  9:48 AM  Visual Inspection See comments:  Yes Sensation Testing Intact to touch and monofilament testing bilaterally:  Yes Pulse Check Posterior Tibialis and Dorsalis pulse intact bilaterally:  Yes Comments See physical exam      HLD - compliant with lipitor 20mg56mly. Ongoing muscle aches and back spasms since this was started. This also relates to increased activity. Taking naprosyn for this.   H/o psoriasis and ?psoriatic arthritis by dermatology diagnosis.   Relevant past medical, surgical, family and social history reviewed and updated as indicated. Interim medical history since our last visit reviewed. Allergies and medications reviewed and updated. Current Outpatient Prescriptions on File Prior to Visit  Medication Sig  . aspirin (ASPIRIN EC) 81 MG EC tablet Take 81 mg by mouth daily. Reported on 06/03/2015  . atorvastatin (LIPITOR) 20 MG tablet Take 1 tablet (20 mg total) by mouth daily.  . BAYMarland KitchenR CONTOUR NEXT TEST test strip CHECK SUGARS TWICE DAILY.  USE AS INSTRUCTED.  . BloMarland Kitchend Glucose Monitoring Suppl (ONE TOUCH ULTRA SYSTEM KIT) W/DEVICE KIT 1 kit by Does not apply route once.  . gliMarland Kitchenepiride (AMARYL) 4 MG tablet Take 1 tablet (4 mg total) by mouth daily with breakfast.  . glucose blood test strip 250.02, check sugars daily.  Use as instructed (Patient taking differently: 250.02, check sugars daily.  Use as instructed (Contour))  . naproxen (NAPROSYN) 500 MG tablet Take one po bid x 1 week then prn pain, take with food  . sitaGLIPtin (JANUVIA) 100 MG tablet Take 1 tablet (100 mg total) by mouth daily.  . vitamin C (ASCORBIC ACID) 500 MG tablet Take 500 mg by mouth as needed.  . Calcium Carb-Cholecalciferol (CALCIUM-VITAMIN D) 600-400 MG-UNIT TABS Take 1 tablet by mouth daily.   No current facility-administered medications on file prior to visit.     Review of Systems Per HPI unless specifically indicated in ROS section     Objective:    BP 124/66   Pulse 60   Temp 97.6 F (36.4 C) (Oral)   Wt 176 lb 4 oz (79.9 kg)   BMI 30.25 kg/m   Wt Readings from Last 3 Encounters:  05/12/16 176 lb 4 oz (79.9 kg)  10/31/15 186 lb 6.4 oz (84.6 kg)  06/03/15 192 lb (87.1 kg)    Physical Exam  Constitutional: She appears well-developed and well-nourished. No distress.  HENT:  Head: Normocephalic and atraumatic.  Right Ear: External ear normal.  Left  Ear: External ear normal.  Nose: Nose normal.  Mouth/Throat: Oropharynx is clear and moist. No oropharyngeal exudate.  Eyes: Conjunctivae and EOM are normal. Pupils are equal, round, and reactive to light. No scleral icterus.  Neck: Normal range of motion. Neck supple.  Cardiovascular: Normal rate, regular rhythm and intact distal pulses.   Murmur (3/6 sysotlic) heard. Pulmonary/Chest: Effort normal and breath sounds normal. No respiratory distress. She has no wheezes. She has no rales.  Musculoskeletal: She exhibits no edema.  See HPI for foot exam if done Small pustular lesions on palms  and soles with scaling present No lesions on extensor surfaces of body  Lymphadenopathy:    She has no cervical adenopathy.  Skin: Skin is warm and dry. No rash noted.  Psychiatric: She has a normal mood and affect.  Nursing note and vitals reviewed.  Results for orders placed or performed in visit on 04/28/16  Comprehensive metabolic panel  Result Value Ref Range   Sodium 139 135 - 145 mEq/L   Potassium 4.0 3.5 - 5.1 mEq/L   Chloride 103 96 - 112 mEq/L   CO2 29 19 - 32 mEq/L   Glucose, Bld 148 (H) 70 - 99 mg/dL   BUN 12 6 - 23 mg/dL   Creatinine, Ser 0.68 0.40 - 1.20 mg/dL   Total Bilirubin 0.5 0.2 - 1.2 mg/dL   Alkaline Phosphatase 73 39 - 117 U/L   AST 20 0 - 37 U/L   ALT 17 0 - 35 U/L   Total Protein 7.1 6.0 - 8.3 g/dL   Albumin 4.0 3.5 - 5.2 g/dL   Calcium 9.6 8.4 - 10.5 mg/dL   GFR 91.36 >60.00 mL/min  Hemoglobin A1c  Result Value Ref Range   Hgb A1c MFr Bld 6.5 4.6 - 6.5 %  LDL Cholesterol, Direct  Result Value Ref Range   Direct LDL 69.0 mg/dL      Assessment & Plan:   Problem List Items Addressed This Visit    Body aches    Ongoing over last several months - ?statin related myalgias. Suggested trial off lipitor for 2 wks and if body aches improved, trial coQ10 with statin and update with effect. Consider changing to less potent statin.      Diabetes mellitus type 2, controlled, without complications ([place]) - Primary    Chronic, marked improvement - congratulated. Continue current regimen, healthy diet changes reviewed.      Relevant Medications   metFORMIN (GLUCOPHAGE) 500 MG tablet   Dyslipidemia    Chronic, stable. Continue lipitor - see below.       NAFLD (nonalcoholic fatty liver disease)   Rash and nonspecific skin eruption    Skin rash on palms and soles - ?tinea infection, however patient states she has been diagnosed by derm with psoriasis and imroved with steroid shot. Suggested return to derm for further management.       RESOLVED:  Work-related stress    Improved with retirement. She has been taking care of husband.           Follow up plan: Return in about 6 months (around 11/09/2016) for annual exam, prior fasting for blood work.  Ria Bush, MD

## 2016-05-12 NOTE — Patient Instructions (Addendum)
Congratulations on healthy changes and great sugar and cholesterol control! For muscle aches - trial co enzyme Q10 supplement daily for the next month. If no improvement, may trial off lipitor for 2 weeks and update me with effect.  Return to see dermatology if rash not improving. Return as needed or in 6 months for physical.

## 2016-06-17 ENCOUNTER — Encounter: Payer: Self-pay | Admitting: Internal Medicine

## 2016-06-17 ENCOUNTER — Ambulatory Visit (INDEPENDENT_AMBULATORY_CARE_PROVIDER_SITE_OTHER): Payer: PPO | Admitting: Internal Medicine

## 2016-06-17 VITALS — BP 118/66 | HR 69 | Temp 98.1°F | Wt 174.0 lb

## 2016-06-17 DIAGNOSIS — R079 Chest pain, unspecified: Secondary | ICD-10-CM

## 2016-06-17 DIAGNOSIS — M546 Pain in thoracic spine: Secondary | ICD-10-CM

## 2016-06-17 MED ORDER — CYCLOBENZAPRINE HCL 5 MG PO TABS: 5.0000 mg | ORAL_TABLET | Freq: Every day | ORAL | 0 refills | Status: DC

## 2016-06-17 NOTE — Progress Notes (Signed)
Subjective:    Patient ID: Betty Sanders, female    DOB: 06/08/1945, 71 y.o.   MRN: [id number]  HPI  Pt presents to the clinic today with c/o back pain. This started 2 weeks ago. The pain is located near her left shoulder blade. She describes the pain as sharp and stabbing. It radiates into her chest. She denies cough, chest tightness, chest pain or shortness of breath. She reports nausea, vomiting or reflux. She has tried heat, ice and Naproxyn with minimal relief. She got a massage 2 days ago without any relief. She reports she does have DDD of her thoracic spine, but this feels different than the pain she has experienced in the past.  Review of Systems      Past Medical History:  Diagnosis Date  . Bilateral hearing loss 2016   hearing aides  . Diabetes mellitus without complication ([place])   . Dyslipidemia   . Heart murmur   . Hyperlipidemia   . Osteopenia 05/2014   DEXA at [place]  . Squamous cell cancer of scalp and skin of neck 2013   s/p excision (Dr. Jimmye Norman)  . Type 2 diabetes mellitus, uncontrolled ([place]) 02/2012   DSME completed 07/2014    Current Outpatient Prescriptions  Medication Sig Dispense Refill  . aspirin (ASPIRIN EC) 81 MG EC tablet Take 81 mg by mouth daily. Reported on 06/03/2015    . atorvastatin (LIPITOR) 20 MG tablet Take 1 tablet (20 mg total) by mouth daily. 30 tablet 6  . Blood Glucose Monitoring Suppl (ONETOUCH VERIO IQ SYSTEM) w/Device KIT Check blood sugar twice a day and as directed. Dx E11.9 1 kit 0  . Calcium Carb-Cholecalciferol (CALCIUM-VITAMIN D) 600-400 MG-UNIT TABS Take 1 tablet by mouth daily.    Marland Kitchen glimepiride (AMARYL) 4 MG tablet Take 1 tablet (4 mg total) by mouth daily with breakfast. 90 tablet 1  . glucose blood (ONETOUCH VERIO) test strip Check blood sugar twice a day and as directed. Dx E11.9 100 each 5  . metFORMIN (GLUCOPHAGE) 500 MG tablet Take 2 tablets in am and 1 at night 270 tablet 1  . naproxen (NAPROSYN) 500 MG tablet Take  one po bid x 1 week then prn pain, take with food 50 tablet 0  . ONETOUCH DELICA LANCETS 03E MISC Check blood sugar twice a day and as directed. Dx E11.9 100 each 5  . sitaGLIPtin (JANUVIA) 100 MG tablet Take 1 tablet (100 mg total) by mouth daily. 90 tablet 3  . vitamin C (ASCORBIC ACID) 500 MG tablet Take 500 mg by mouth as needed.     No current facility-administered medications for this visit.     Allergies  Allergen Reactions  . Sulfa Antibiotics     Possible rxn, unsure.    Family History  Problem Relation Age of Onset  . Lung cancer Father   . Colon cancer Cousin   . Breast cancer Paternal Aunt   . Cervical cancer Maternal Aunt 97    female cancer  . Diabetes Maternal Grandfather   . Stroke Paternal Grandfather   . CAD Neg Hx     Social History   Social History  . Marital status: Married    Spouse name: N/A  . Number of children: N/A  . Years of education: N/A   Occupational History  . Radiation Physicist [employer]   Social History Main Topics  . Smoking status: Former Smoker    Quit date: 04/19/1968  . Smokeless tobacco: Never Used  .  Alcohol use 0.0 oz/week     Comment: Rare  . Drug use: No  . Sexual activity: Not on file   Other Topics Concern  . 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   Activity: kayak, restarting treadmill, wears fit bit - wants to get to 10000steps/day   Diet: My.fitness.pal 1200cal/day, good water, fruits/vegetables daily     Constitutional: Denies fever, malaise, fatigue, headache or abrupt weight changes.  HEENT: Denies eye pain, eye redness, ear pain, ringing in the ears, wax buildup, runny nose, nasal congestion, bloody nose, or sore throat. Respiratory: Denies difficulty breathing, shortness of breath, cough or sputum production.   Cardiovascular: Denies chest pain, chest tightness, palpitations or  swelling in the hands or feet.  Gastrointestinal: Denies abdominal pain, bloating, constipation, diarrhea or blood in the stool.  GU: Denies urgency, frequency, pain with urination, burning sensation, blood in urine, odor or discharge. Musculoskeletal: Pt reports back pain. Denies decrease in range of motion, difficulty with gait, or joint pain and swelling.   No other specific complaints in a complete review of systems (except as listed in HPI above).  Objective:   Physical Exam  BP 118/66   Pulse 69   Temp 98.1 F (36.7 C) (Oral)   Wt 174 lb (78.9 kg)   SpO2 99%   BMI 29.87 kg/m  Wt Readings from Last 3 Encounters:  06/17/16 174 lb (78.9 kg)  05/12/16 176 lb 4 oz (79.9 kg)  10/31/15 186 lb 6.4 oz (84.6 kg)    General: Appears her stated age, in NAD. Skin: Warm, dry and intact. No rashes noted of back. Cardiovascular: Normal rate and rhythm. Murmur noted. Pulmonary/Chest: Normal effort and positive vesicular breath sounds. No respiratory distress. No wheezes, rales or ronchi noted.  Abdomen: Soft and nontender. Normal bowel sounds. No distention or masses noted. Musculoskeletal: Pain with palpation of left lower part of the trapezius.    BMET    Component Value Date/Time   NA 139 04/28/2016 0808   K 4.0 04/28/2016 0808   CL 103 04/28/2016 0808   CO2 29 04/28/2016 0808   GLUCOSE 148 (H) 04/28/2016 0808   BUN 12 04/28/2016 0808   CREATININE 0.68 04/28/2016 0808   CALCIUM 9.6 04/28/2016 0808    Lipid Panel     Component Value Date/Time   CHOL 204 (H) 10/28/2015 0754   TRIG 155.0 (H) 10/28/2015 0754   HDL 35.90 (L) 10/28/2015 0754   CHOLHDL 6 10/28/2015 0754   VLDL 31.0 10/28/2015 0754   LDLCALC 137 (H) 10/28/2015 0754    CBC    Component Value Date/Time   WBC 8.6 05/18/2012 1543   RBC 4.40 05/18/2012 1543   HGB 12.1 05/18/2012 1543   HCT 35.0 (L) 05/18/2012 1543   PLT 331.0 05/18/2012 1543   MCV 79.6 05/18/2012 1543   MCHC 34.6 05/18/2012 1543   RDW  13.6 05/18/2012 1543   LYMPHSABS 3.0 05/18/2012 1543   MONOABS 0.4 05/18/2012 1543   EOSABS 0.1 05/18/2012 1543   BASOSABS 0.0 05/18/2012 1543    Hgb A1C Lab Results  Component Value Date   HGBA1C 6.5 04/28/2016            Assessment & Plan:   Acute Left Sided Thoracic Back Pain:  ECG today- no acute findings Likely muscular Continue NSAID eRx for Flexeril 5 mg PO QHS  RTC as needed or if symptoms persist or worsen Hillel Card, NP

## 2016-06-17 NOTE — Patient Instructions (Signed)
Mid-Back Strain Rehab Ask your health care provider which exercises are safe for you. Do exercises exactly as told by your health care provider and adjust them as directed. It is normal to feel mild stretching, pulling, tightness, or discomfort as you do these exercises, but you should stop right away if you feel sudden pain or your pain gets worse. Do not begin these exercises until told by your health care provider. Stretching and range of motion exercises This exercise warms up your muscles and joints and improves the movement and flexibility of your back and shoulders. This exercise also help to relieve pain. Exercise A: Chest and spine stretch   1. Lie down on your back on a firm surface. 2. Roll a towel or a small blanket so it is about 4 inches (10 cm) in diameter. 3. Put the towel lengthwise under the middle of your back so it is under your spine, but not under your shoulder blades. 4. To increase the stretch, you may put your hands behind your head and let your elbows fall to your sides. 5. Hold for __________ seconds. Repeat exercise __________ times. Complete this exercise __________ times a day. Strengthening exercises These exercises build strength and endurance in your back and your shoulder blade muscles. Endurance is the ability to use your muscles for a long time, even after they get tired. Exercise B: Alternating arm and leg raises   1. Get on your hands and knees on a firm surface. If you are on a hard floor, you may want to use padding to cushion your knees, such as an exercise mat. 2. Line up your arms and legs. Your hands should be below your shoulders, and your knees should be below your hips. 3. Lift your left leg behind you. At the same time, raise your right arm and straighten it in front of you.  Do not lift your leg higher than your hip.  Do not lift your arm higher than your shoulder.  Keep your abdominal and back muscles tight.  Keep your hips facing the  ground.  Do not arch your back.  Keep your balance carefully, and do not hold your breath. 4. Hold for __________ seconds. 5. Slowly return to the starting position and repeat with your right leg and your left arm. Repeat __________ times. Complete this exercise __________ times a day. Exercise C: Straight arm rows (  shoulder extension) 1. Stand with your feet shoulder width apart. 2. Secure an exercise band to a stable object in front of you so the band is at or above shoulder height. 3. Hold one end of the exercise band in each hand. 4. Straighten your elbows and lift your hands up to shoulder height. 5. Step back, away from the secured end of the exercise band, until the band stretches. 6. Squeeze your shoulder blades together and pull your hands down to the sides of your thighs. Stop when your hands are straight down by your sides. Do not let your hands go behind your body. 7. Hold for __________ seconds. 8. Slowly return to the starting position. Repeat __________ times. Complete this exercise __________ times a day. Exercise D: Shoulder external rotation, prone 1. Lie on your abdomen on a firm bed so your left / right forearm hangs over the edge of the bed and your upper arm is on the bed, straight out from your body.  Your elbow should be bent.  Your palm should be facing your feet. 2. If instructed, hold   a __________ weight in your hand. 3. Squeeze your shoulder blade toward the middle of your back. Do not let your shoulder lift toward your ear. 4. Keep your elbow bent in an "L" shape (90 degrees) while you slowly move your forearm up toward the ceiling. Move your forearm up to the height of the bed, toward your head.  Your upper arm should not move.  At the top of the movement, your palm should face the floor. 5. Hold for __________ seconds. 6. Slowly return to the starting position and relax your muscles. Repeat __________ times. Complete this exercise __________ times a  day. Exercise E: Scapular retraction and external rotation, rowing  1. Sit in a stable chair without armrests, or stand. 2. Secure an exercise band to a stable object in front of you so it is at shoulder height. 3. Hold one end of the exercise band in each hand. 4. Bring your arms out straight in front of you. 5. Step back, away from the secured end of the exercise band, until the band stretches. 6. Pull the band backward. As you do this, bend your elbows and squeeze your shoulder blades together, but avoid letting the rest of your body move. Do not let your shoulders lift up toward your ears. 7. Stop when your elbows are at your sides or slightly behind your body. 8. Hold for __________ seconds. 9. Slowly straighten your arms to return to the starting position. Repeat __________ times. Complete this exercise __________ times a day. Posture and body mechanics  Body mechanics refers to the movements and positions of your body while you do your daily activities. Posture is part of body mechanics. Good posture and healthy body mechanics can help to relieve stress in your body's tissues and joints. Good posture means that your spine is in its natural S-curve position (your spine is neutral), your shoulders are pulled back slightly, and your head is not tipped forward. The following are general guidelines for applying improved posture and body mechanics to your everyday activities. Standing   When standing, keep your spine neutral and your feet about hip-width apart. Keep a slight bend in your knees. Your ears, shoulders, and hips should line up.  When you do a task in which you lean forward while standing in one place for a long time, place one foot up on a stable object that is 2-4 inches (5-10 cm) high, such as a footstool. This helps keep your spine neutral. Sitting   When sitting, keep your spine neutral and keep your feet flat on the floor. Use a footrest, if necessary, and keep your thighs  parallel to the floor. Avoid rounding your shoulders, and avoid tilting your head forward.  When working at a desk or a computer, keep your desk at a height where your hands are slightly lower than your elbows. Slide your chair under your desk so you are close enough to maintain good posture.  When working at a computer, place your monitor at a height where you are looking straight ahead and you do not have to tilt your head forward or downward to look at the screen. Resting  When lying down and resting, avoid positions that are most painful for you.  If you have pain with activities such as sitting, bending, stooping, or squatting (flexion-based activities), lie in a position in which your body does not bend very much. For example, avoid curling up on your side with your arms and knees near your chest (fetal   position).  If you have pain with activities such as standing for a long time or reaching with your arms (extension-based activities), lie with your spine in a neutral position and bend your knees slightly. Try the following positions:  Lying on your side with a pillow between your knees.  Lying on your back with a pillow under your knees. Lifting   When lifting objects, keep your feet at least shoulder-width apart and tighten your abdominal muscles.  Bend your knees and hips and keep your spine neutral. It is important to lift using the strength of your legs, not your back. Do not lock your knees straight out.  Always ask for help to lift heavy or awkward objects. This information is not intended to replace advice given to you by your health care provider. Make sure you discuss any questions you have with your health care provider. Document Released: 04/05/2005 Document Revised: 12/11/2015 Document Reviewed: 01/15/2015 Elsevier Interactive Patient Education  2017 [employer]  

## 2016-06-18 ENCOUNTER — Telehealth: Payer: Self-pay

## 2016-06-18 ENCOUNTER — Ambulatory Visit: Payer: PPO | Admitting: Family Medicine

## 2016-06-18 ENCOUNTER — Telehealth: Payer: Self-pay | Admitting: *Deleted

## 2016-06-18 ENCOUNTER — Other Ambulatory Visit: Payer: Self-pay | Admitting: Family Medicine

## 2016-06-18 NOTE — Telephone Encounter (Signed)
Pt is aware as instructed 

## 2016-06-18 NOTE — Telephone Encounter (Signed)
Pt left /vm; pt seen 06/17/16 for back pain. Pt took muscle relaxer last night and again this morning; pt does not feel med is effective and wants to know if should increase cyclobenzaprine or naproxen. Pt request cb. [facility].

## 2016-06-18 NOTE — Telephone Encounter (Addendum)
Flexeril required PA. Completed and approved via Cover My Meds. Pharmacy notified.

## 2016-06-18 NOTE — Telephone Encounter (Signed)
Can increase Flexeril to 10 mg QHS prn

## 2016-06-24 ENCOUNTER — Telehealth: Payer: Self-pay | Admitting: *Deleted

## 2016-06-24 NOTE — Telephone Encounter (Signed)
Patient called requesting referral to [facility] for her back pain. She said she had been dx with RA through bloodwork years ago along with psoriasis, and believes they may be related. She saw Rollene Fare for the back pain and was given flexeril for her back, but it hasn't helped. She is aware you are out of town and said she is fine waiting for your return.

## 2016-06-27 NOTE — Telephone Encounter (Signed)
Would she be willing to come in to see me Wed 12:30 or thurs morning any time between 9-10am? I'd like to see her first and discuss further labs for RA.

## 2016-06-28 NOTE — Telephone Encounter (Signed)
Spoke with patient and appt scheduled. 

## 2016-07-01 ENCOUNTER — Ambulatory Visit (INDEPENDENT_AMBULATORY_CARE_PROVIDER_SITE_OTHER): Payer: PPO | Admitting: Family Medicine

## 2016-07-01 ENCOUNTER — Encounter: Payer: Self-pay | Admitting: Family Medicine

## 2016-07-01 VITALS — BP 116/64 | HR 62 | Temp 98.0°F | Wt 172.2 lb

## 2016-07-01 DIAGNOSIS — R011 Cardiac murmur, unspecified: Secondary | ICD-10-CM

## 2016-07-01 DIAGNOSIS — M5134 Other intervertebral disc degeneration, thoracic region: Secondary | ICD-10-CM | POA: Diagnosis not present

## 2016-07-01 DIAGNOSIS — R21 Rash and other nonspecific skin eruption: Secondary | ICD-10-CM

## 2016-07-01 DIAGNOSIS — S29012D Strain of muscle and tendon of back wall of thorax, subsequent encounter: Secondary | ICD-10-CM | POA: Diagnosis not present

## 2016-07-01 MED ORDER — CYCLOBENZAPRINE HCL 10 MG PO TABS: 10.0000 mg | ORAL_TABLET | Freq: Two times a day (BID) | ORAL | 0 refills | Status: DC | PRN

## 2016-07-01 NOTE — Assessment & Plan Note (Signed)
This seems different than her thoracic DDD

## 2016-07-01 NOTE — Assessment & Plan Note (Signed)
Anticipate strain already improving this week - recommend supportive care with continued muscle relaxant, NSAID, heating pad and provided with exercises from [facility] pt advisor on rhomboid strain. Update if not improving as expected.  Not consistent with arthritis.

## 2016-07-01 NOTE — Progress Notes (Signed)
BP 116/64 (BP Location: Left Arm, Patient Position: Sitting, Cuff Size: Normal)   Pulse 62   Temp 98 F (36.7 C) (Oral)   Wt 172 lb 4 oz (78.1 kg)   SpO2 98%   BMI 29.57 kg/m    CC: discuss possible RA Subjective:    Patient ID: Betty Sanders, female    DOB: 1945-12-15, 71 y.o.   MRN: [id number]  HPI: Betty Sanders is a 71 y.o. female presenting on 07/01/2016 for Follow-up (discuss RA)   See prior note for details. Seen here 2 wks ago by Rollene Fare with L shoulderblade pain described as constant sharp, stabbing and radiating to chest. Treated with flexeril 5-80m, NSAIDs without improvement. Also used heating pad and massage. No cough, chest pain, dyspnea. Slowly improving over the last week - actually today feeling better. She also had fever blisters, canker sores of mouth and mouth soreness. Denies inciting trauma/injury.   Prior stopping lipitor did help body aches. Off lipitor for last 2 months. She has started coQ 10 as well.   EKG reviewed - NSR, p mitrale.   Ongoing rash on pals and soles. Has previously seen dermatologist, dx with psoriasis. Has been told may have had psoriatic arthritis vs rheumatoid arthritis (by labs).   h/o systolic murmur - denies chest pain, dyspnea or dizziness.   Relevant past medical, surgical, family and social history reviewed and updated as indicated. Interim medical history since our last visit reviewed. Allergies and medications reviewed and updated. Outpatient Medications Prior to Visit  Medication Sig Dispense Refill  . aspirin (ASPIRIN EC) 81 MG EC tablet Take 81 mg by mouth daily. Reported on 06/03/2015    . Blood Glucose Monitoring Suppl (ONETOUCH VERIO IQ SYSTEM) w/Device KIT Check blood sugar twice a day and as directed. Dx E11.9 1 kit 0  . glimepiride (AMARYL) 4 MG tablet Take 1 tablet (4 mg total) by mouth daily with breakfast. 90 tablet 1  . glucose blood (ONETOUCH VERIO) test strip Check blood sugar twice a day and as directed.  Dx E11.9 100 each 5  . metFORMIN (GLUCOPHAGE) 500 MG tablet Take 2 tablets in am and 1 at night 270 tablet 1  . naproxen (NAPROSYN) 500 MG tablet TAKE 1 TABLET BY MOUTH TWICE A DAY FOR 1 WEEK THEN AS NEEDED FOR PAIN, TAKE WITH FOOD 50 tablet 0  . ONETOUCH DELICA LANCETS 322WMISC Check blood sugar twice a day and as directed. Dx E11.9 100 each 5  . sitaGLIPtin (JANUVIA) 100 MG tablet Take 1 tablet (100 mg total) by mouth daily. 90 tablet 3  . vitamin C (ASCORBIC ACID) 500 MG tablet Take 500 mg by mouth as needed.    . cyclobenzaprine (FLEXERIL) 5 MG tablet Take 1 tablet (5 mg total) by mouth at bedtime. 20 tablet 0  . atorvastatin (LIPITOR) 20 MG tablet Take 1 tablet (20 mg total) by mouth daily. (Patient not taking: Reported on 06/17/2016) 30 tablet 6  . Calcium Carb-Cholecalciferol (CALCIUM-VITAMIN D) 600-400 MG-UNIT TABS Take 1 tablet by mouth daily.     No facility-administered medications prior to visit.      Per HPI unless specifically indicated in ROS section below Review of Systems     Objective:    BP 116/64 (BP Location: Left Arm, Patient Position: Sitting, Cuff Size: Normal)   Pulse 62   Temp 98 F (36.7 C) (Oral)   Wt 172 lb 4 oz (78.1 kg)   SpO2 98%   BMI 29.57  kg/m   Wt Readings from Last 3 Encounters:  07/01/16 172 lb 4 oz (78.1 kg)  06/17/16 174 lb (78.9 kg)  05/12/16 176 lb 4 oz (79.9 kg)    Physical Exam  Constitutional: She is oriented to person, place, and time. She appears well-developed and well-nourished. No distress.  HENT:  Head: Normocephalic and atraumatic.  Mouth/Throat: Oropharynx is clear and moist. No oropharyngeal exudate.  Eyes: Conjunctivae and EOM are normal. Pupils are equal, round, and reactive to light. No scleral icterus.  Cardiovascular: Normal rate, regular rhythm and intact distal pulses.   Murmur (2/6 late systolic murmur) heard. Pulmonary/Chest: Effort normal and breath sounds normal. No respiratory distress. She has no wheezes. She has  no rales.  Musculoskeletal: Normal range of motion. She exhibits no edema.  No midline cervical or thoracic spine tenderness Point tender to palpation L rhomboids No scapular discomfort No pain to palpation at sternum or costochondral joints  Neurological: She is alert and oriented to person, place, and time.  Sensation intact  Skin: Skin is warm and dry. No rash noted.  Psychiatric: She has a normal mood and affect.  Nursing note and vitals reviewed.  Results for orders placed or performed in visit on 04/28/16  Comprehensive metabolic panel  Result Value Ref Range   Sodium 139 135 - 145 mEq/L   Potassium 4.0 3.5 - 5.1 mEq/L   Chloride 103 96 - 112 mEq/L   CO2 29 19 - 32 mEq/L   Glucose, Bld 148 (H) 70 - 99 mg/dL   BUN 12 6 - 23 mg/dL   Creatinine, Ser 0.68 0.40 - 1.20 mg/dL   Total Bilirubin 0.5 0.2 - 1.2 mg/dL   Alkaline Phosphatase 73 39 - 117 U/L   AST 20 0 - 37 U/L   ALT 17 0 - 35 U/L   Total Protein 7.1 6.0 - 8.3 g/dL   Albumin 4.0 3.5 - 5.2 g/dL   Calcium 9.6 8.4 - 10.5 mg/dL   GFR 91.36 >60.00 mL/min  Hemoglobin A1c  Result Value Ref Range   Hgb A1c MFr Bld 6.5 4.6 - 6.5 %  LDL Cholesterol, Direct  Result Value Ref Range   Direct LDL 69.0 mg/dL      Assessment & Plan:   Problem List Items Addressed This Visit    DDD (degenerative disc disease), thoracic    This seems different than her thoracic DDD      Relevant Medications   cyclobenzaprine (FLEXERIL) 10 MG tablet   Rash and nonspecific skin eruption    Pt has been told she has psoriasis. Declines derm referral at this time.       Rhomboid muscle strain, subsequent encounter - Primary    Anticipate strain already improving this week - recommend supportive care with continued muscle relaxant, NSAID, heating pad and provided with exercises from [facility] pt advisor on rhomboid strain. Update if not improving as expected.  Not consistent with arthritis.       Systolic murmur    Chronic, stable. ?MVP - consider  echocardiogram for further evaluation. Current asxs.           Follow up plan: Return if symptoms worsen or fail to improve.  Ria Bush, MD

## 2016-07-01 NOTE — Assessment & Plan Note (Signed)
Chronic, stable. ?MVP - consider echocardiogram for further evaluation. Current asxs.

## 2016-07-01 NOTE — Assessment & Plan Note (Signed)
Pt has been told she has psoriasis. Declines derm referral at this time.

## 2016-07-01 NOTE — Patient Instructions (Signed)
I think you had rhomboid strain which is slowly getting better. Continue heating pad, tylenol, muscle relaxant  Do exercises provided today.  Let us know if returning symptoms or any joint pains to consider labs.

## 2016-07-01 NOTE — Progress Notes (Signed)
Pre visit review using our clinic review tool, if applicable. No additional management support is needed unless otherwise documented below in the visit note. 

## 2016-10-27 ENCOUNTER — Other Ambulatory Visit: Payer: Self-pay | Admitting: Family Medicine

## 2016-10-27 DIAGNOSIS — E785 Hyperlipidemia, unspecified: Secondary | ICD-10-CM

## 2016-10-27 DIAGNOSIS — E119 Type 2 diabetes mellitus without complications: Secondary | ICD-10-CM

## 2016-10-27 DIAGNOSIS — K760 Fatty (change of) liver, not elsewhere classified: Secondary | ICD-10-CM

## 2016-10-28 ENCOUNTER — Other Ambulatory Visit (INDEPENDENT_AMBULATORY_CARE_PROVIDER_SITE_OTHER): Payer: PPO

## 2016-10-28 DIAGNOSIS — K760 Fatty (change of) liver, not elsewhere classified: Secondary | ICD-10-CM | POA: Diagnosis not present

## 2016-10-28 DIAGNOSIS — E785 Hyperlipidemia, unspecified: Secondary | ICD-10-CM

## 2016-10-28 DIAGNOSIS — E119 Type 2 diabetes mellitus without complications: Secondary | ICD-10-CM | POA: Diagnosis not present

## 2016-10-28 LAB — COMPREHENSIVE METABOLIC PANEL
ALT: 21 U/L (ref 0–35)
AST: 24 U/L (ref 0–37)
Albumin: 4.3 g/dL (ref 3.5–5.2)
Alkaline Phosphatase: 78 U/L (ref 39–117)
BILIRUBIN TOTAL: 0.6 mg/dL (ref 0.2–1.2)
BUN: 13 mg/dL (ref 6–23)
CALCIUM: 9.9 mg/dL (ref 8.4–10.5)
CO2: 28 meq/L (ref 19–32)
CREATININE: 0.82 mg/dL (ref 0.40–1.20)
Chloride: 101 mEq/L (ref 96–112)
GFR: 73.5 mL/min (ref >60.00)
Glucose, Bld: 193 mg/dL — ABNORMAL HIGH (ref 70–99)
Potassium: 4 mEq/L (ref 3.5–5.1)
Sodium: 137 mEq/L (ref 135–145)
TOTAL PROTEIN: 7.7 g/dL (ref 6.0–8.3)

## 2016-10-28 LAB — HEMOGLOBIN A1C: Hgb A1c MFr Bld: 6.6 % — ABNORMAL HIGH (ref 4.6–6.5)

## 2016-10-28 LAB — LIPID PANEL
CHOLESTEROL: 212 mg/dL — AB (ref 0–200)
HDL: 45.2 mg/dL (ref >39.00)
LDL Cholesterol: 139 mg/dL — ABNORMAL HIGH (ref 0–99)
NONHDL: 166.94
TRIGLYCERIDES: 140 mg/dL (ref 0.0–149.0)
Total CHOL/HDL Ratio: 5
VLDL: 28 mg/dL (ref 0.0–40.0)

## 2016-10-28 LAB — MICROALBUMIN / CREATININE URINE RATIO
Creatinine,U: 83.9 mg/dL
Microalb Creat Ratio: 0.8 mg/g (ref 0.0–30.0)
Microalb, Ur: <0.7 mg/dL (ref 0.0–1.9)

## 2016-11-01 ENCOUNTER — Other Ambulatory Visit (HOSPITAL_COMMUNITY)
Admission: RE | Admit: 2016-11-01 | Discharge: 2016-11-01 | Disposition: A | Discharge status: Home / Self Care | Payer: PPO | Source: Ambulatory Visit | Attending: Family Medicine | Admitting: Family Medicine

## 2016-11-01 ENCOUNTER — Ambulatory Visit (INDEPENDENT_AMBULATORY_CARE_PROVIDER_SITE_OTHER): Payer: PPO | Admitting: Family Medicine

## 2016-11-01 ENCOUNTER — Other Ambulatory Visit: Payer: Self-pay | Admitting: Family Medicine

## 2016-11-01 ENCOUNTER — Encounter: Payer: Self-pay | Admitting: Family Medicine

## 2016-11-01 VITALS — BP 116/68 | HR 62 | Temp 98.5°F | Ht 63.75 in | Wt 176.8 lb

## 2016-11-01 DIAGNOSIS — M8580 Other specified disorders of bone density and structure, unspecified site: Secondary | ICD-10-CM

## 2016-11-01 DIAGNOSIS — Z7189 Other specified counseling: Secondary | ICD-10-CM | POA: Diagnosis not present

## 2016-11-01 DIAGNOSIS — E785 Hyperlipidemia, unspecified: Secondary | ICD-10-CM

## 2016-11-01 DIAGNOSIS — F4320 Adjustment disorder, unspecified: Secondary | ICD-10-CM | POA: Diagnosis not present

## 2016-11-01 DIAGNOSIS — Z1239 Encounter for other screening for malignant neoplasm of breast: Secondary | ICD-10-CM

## 2016-11-01 DIAGNOSIS — R21 Rash and other nonspecific skin eruption: Secondary | ICD-10-CM | POA: Diagnosis not present

## 2016-11-01 DIAGNOSIS — Z0000 Encounter for general adult medical examination without abnormal findings: Secondary | ICD-10-CM | POA: Diagnosis not present

## 2016-11-01 DIAGNOSIS — E66811 Obesity, class 1: Secondary | ICD-10-CM

## 2016-11-01 DIAGNOSIS — E119 Type 2 diabetes mellitus without complications: Secondary | ICD-10-CM | POA: Diagnosis not present

## 2016-11-01 DIAGNOSIS — R011 Cardiac murmur, unspecified: Secondary | ICD-10-CM | POA: Diagnosis not present

## 2016-11-01 DIAGNOSIS — E669 Obesity, unspecified: Secondary | ICD-10-CM

## 2016-11-01 DIAGNOSIS — R0989 Other specified symptoms and signs involving the circulatory and respiratory systems: Secondary | ICD-10-CM

## 2016-11-01 DIAGNOSIS — E663 Overweight: Secondary | ICD-10-CM | POA: Insufficient documentation

## 2016-11-01 DIAGNOSIS — F4321 Adjustment disorder with depressed mood: Secondary | ICD-10-CM

## 2016-11-01 DIAGNOSIS — Z1231 Encounter for screening mammogram for malignant neoplasm of breast: Secondary | ICD-10-CM | POA: Diagnosis not present

## 2016-11-01 DIAGNOSIS — Z0001 Encounter for general adult medical examination with abnormal findings: Secondary | ICD-10-CM

## 2016-11-01 MED ORDER — METFORMIN HCL 500 MG PO TABS: ORAL_TABLET | ORAL | 3 refills | Status: DC

## 2016-11-01 MED ORDER — GLIMEPIRIDE 4 MG PO TABS: 4.0000 mg | ORAL_TABLET | Freq: Every day | ORAL | 3 refills | Status: DC

## 2016-11-01 MED ORDER — PRAVASTATIN SODIUM 40 MG PO TABS: 40.0000 mg | ORAL_TABLET | Freq: Every day | ORAL | 3 refills | Status: DC

## 2016-11-01 MED ORDER — DOXYCYCLINE HYCLATE 100 MG PO TABS: 100.0000 mg | ORAL_TABLET | Freq: Two times a day (BID) | ORAL | 0 refills | Status: DC

## 2016-11-01 MED ORDER — SITAGLIPTIN PHOSPHATE 100 MG PO TABS: 100.0000 mg | ORAL_TABLET | Freq: Every day | ORAL | 3 refills | Status: DC

## 2016-11-01 NOTE — Assessment & Plan Note (Signed)
Chronic, stable 

## 2016-11-01 NOTE — Assessment & Plan Note (Signed)

## 2016-11-01 NOTE — Assessment & Plan Note (Signed)
Discussed healthy diet and lifestyle changes to affect sustainable weight loss  

## 2016-11-01 NOTE — Assessment & Plan Note (Addendum)
Support provided. 

## 2016-11-01 NOTE — Progress Notes (Addendum)
BP 116/68   Pulse 62   Temp 98.5 F (36.9 C) (Oral)   Ht 5' 3.75" (1.619 m)   Wt 176 lb 12 oz (80.2 kg)   SpO2 96%   BMI 30.58 kg/m    CC: welcome to medicare visit Subjective:    Patient ID: Betty Sanders, female    DOB: [birth year], 71 y.o.   MRN: [id number]  HPI: Betty Sanders is a 71 y.o. female presenting on 11/01/2016 for Annual Exam (Medicare)   Retired 03-01-16.  Husband passed away last month - amyloidosis after open heart surgery. Continues grieving.   Preventative: COLONOSCOPY Date: 05/2015 diverticulosis, rpt 10 yrs Carlean Purl) Mammogram 03/2015, Birads 1. Would like referral to [facility] Well woman exam - always normal.last 04/2013. [facility] (aunt) GYN cancer, unsure type. Discussed, will space out to Q3 yrs to 2018.  DEXA - osteopenia 05/2014  Flu shot yearly Td 2009  Pneumovax 2013/03/01, prevnar 2016 Shingrix - discussed Advanced planning - has set up. Sister is HCPOA. Asked to bring [place] copy.  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 Activity: kayak, restarting treadmill, wears fit bit - wants to get to 10000steps/day Diet: My.fitness.pal 1200cal/day, good water, fruits/vegetables daily  Relevant past medical, surgical, family and social history reviewed and updated as indicated. Interim medical history since our last visit reviewed. Allergies and medications reviewed and updated. Outpatient Medications Prior to Visit  Medication Sig Dispense Refill  . aspirin (ASPIRIN EC) 81 MG EC tablet Take 81 mg by mouth daily. Reported on 06/03/2015    . Blood Glucose Monitoring Suppl (ONETOUCH VERIO IQ SYSTEM) w/Device KIT Check blood sugar twice a day and as directed. Dx E11.9 1 kit 0  . co-enzyme Q-10 30 MG capsule Take 30 mg by mouth daily.    . cyclobenzaprine (FLEXERIL) 10 MG  tablet Take 1 tablet (10 mg total) by mouth 2 (two) times daily as needed for muscle spasms. 30 tablet 0  . glucose blood (ONETOUCH VERIO) test strip Check blood sugar twice a day and as directed. Dx E11.9 100 each 5  . naproxen (NAPROSYN) 500 MG tablet TAKE 1 TABLET BY MOUTH TWICE A DAY FOR 1 WEEK THEN AS NEEDED FOR PAIN, TAKE WITH FOOD 50 tablet 0  . ONETOUCH DELICA LANCETS 53G MISC Check blood sugar twice a day and as directed. Dx E11.9 100 each 5  . vitamin C (ASCORBIC ACID) 500 MG tablet Take 500 mg by mouth as needed.    Marland Kitchen glimepiride (AMARYL) 4 MG tablet Take 1 tablet (4 mg total) by mouth daily with breakfast. 90 tablet 1  . metFORMIN (GLUCOPHAGE) 500 MG tablet Take 2 tablets in am and 1 at night 270 tablet 1  . sitaGLIPtin (JANUVIA) 100 MG tablet Take 1 tablet (100 mg total) by mouth daily. 90 tablet 3   No facility-administered medications prior to visit.      Per HPI unless specifically indicated in ROS section below Review of Systems     Objective:    BP 116/68   Pulse 62   Temp 98.5 F (36.9 C) (Oral)   Ht 5' 3.75" (1.619 m)   Wt 176 lb 12 oz (80.2 kg)   SpO2 96%   BMI 30.58 kg/m   Wt Readings from Last 3 Encounters:  11/01/16 176 lb 12 oz (  80.2 kg)  07/01/16 172 lb 4 oz (78.1 kg)  06/17/16 174 lb (78.9 kg)    Physical Exam  Constitutional: She is oriented to person, place, and time. She appears well-developed and well-nourished. No distress.  HENT:  Head: Normocephalic and atraumatic.  Right Ear: Hearing, tympanic membrane, external ear and ear canal normal.  Left Ear: Hearing, tympanic membrane, external ear and ear canal normal.  Nose: Nose normal.  Mouth/Throat: Uvula is midline, oropharynx is clear and moist and mucous membranes are normal. No oropharyngeal exudate, posterior oropharyngeal edema or posterior oropharyngeal erythema.  Eyes: Pupils are equal, round, and reactive to light. Conjunctivae and EOM are normal. No scleral icterus.  Neck: Normal range  of motion. Neck supple. Carotid bruit is present (L ). No thyromegaly present.  Cardiovascular: Normal rate, regular rhythm and intact distal pulses.   Murmur (3/6 SEM) heard. Pulses:      Radial pulses are 2+ on the right side, and 2+ on the left side.  Pulmonary/Chest: Effort normal and breath sounds normal. No respiratory distress. She has no wheezes. She has no rales.  Abdominal: Soft. Bowel sounds are normal. She exhibits no distension and no mass. There is no tenderness. There is no rebound and no guarding.  Musculoskeletal: Normal range of motion. She exhibits no edema.  Lymphadenopathy:    She has no cervical adenopathy.  Neurological: She is alert and oriented to person, place, and time.  CN grossly intact, station and gait intact  Skin: Skin is warm and dry. Rash noted.  Erythematous rash with central clearing just below recent bug bite L medial calf  Psychiatric: She has a normal mood and affect. Her behavior is normal. Judgment and thought content normal.  Nursing note and vitals reviewed.  Hearing Screening Comments: Pt wears hearing aids Vision Screening Comments: Completed Nov 2017 at [facility]  Results for orders placed or performed in visit on 10/28/16  Lipid panel  Result Value Ref Range   Cholesterol 212 (H) 0 - 200 mg/dL   Triglycerides 140.0 0.0 - 149.0 mg/dL   HDL 45.20 >39.00 mg/dL   VLDL 28.0 0.0 - 40.0 mg/dL   LDL Cholesterol 139 (H) 0 - 99 mg/dL   Total CHOL/HDL Ratio 5    NonHDL 166.94   Hemoglobin A1c  Result Value Ref Range   Hgb A1c MFr Bld 6.6 (H) 4.6 - 6.5 %  Comprehensive metabolic panel  Result Value Ref Range   Sodium 137 135 - 145 mEq/L   Potassium 4.0 3.5 - 5.1 mEq/L   Chloride 101 96 - 112 mEq/L   CO2 28 19 - 32 mEq/L   Glucose, Bld 193 (H) 70 - 99 mg/dL   BUN 13 6 - 23 mg/dL   Creatinine, Ser 0.82 0.40 - 1.20 mg/dL   Total Bilirubin 0.6 0.2 - 1.2 mg/dL   Alkaline Phosphatase 78 39 - 117 U/L   AST 24 0 - 37 U/L   ALT 21 0 - 35 U/L     Total Protein 7.7 6.0 - 8.3 g/dL   Albumin 4.3 3.5 - 5.2 g/dL   Calcium 9.9 8.4 - 10.5 mg/dL   GFR 73.50 >60.00 mL/min  Microalbumin / creatinine urine ratio  Result Value Ref Range   Microalb, Ur <0.7 0.0 - 1.9 mg/dL   Creatinine,U 83.9 mg/dL   Microalb Creat Ratio 0.8 0.0 - 30.0 mg/g   EKG - sinus bradycardia rate 50s, normal axis, intervals, no acute ST/T changes    Assessment &  Plan:   Problem List Items Addressed This Visit    Advanced care planning/counseling discussion    Advanced planning - has set up. Sister is HCPOA. Asked to bring [place] copy.       Diabetes mellitus type 2, controlled, without complications (HCC)    Chronic, stable. Continue current regimen.       Relevant Medications   pravastatin (PRAVACHOL) 40 MG tablet   glimepiride (AMARYL) 4 MG tablet   metFORMIN (GLUCOPHAGE) 500 MG tablet   sitaGLIPtin (JANUVIA) 100 MG tablet   Dyslipidemia    Chronic off meds. Pt agrees to try low potency statin - pravastatin sent in. H/o lipitor intolerance The 10-year ASCVD risk score Mikey Bussing DC Jr., et al., 2013) is: 12.9%   Values used to calculate the score:     Age: 55 years     Sex: Female     Is Non-Hispanic African American: No     Diabetic: Yes     Tobacco smoker: No     Systolic Blood Pressure: 500 mmHg     Is BP treated: No     HDL Cholesterol: 45.2 mg/dL     Total Cholesterol: 212 mg/dL       Relevant Medications   pravastatin (PRAVACHOL) 40 MG tablet   Grieving    Support provided.      Obesity, Class I, BMI 30-34.9    Discussed healthy diet and lifestyle changes to affect sustainable weight loss.       Relevant Medications   glimepiride (AMARYL) 4 MG tablet   metFORMIN (GLUCOPHAGE) 500 MG tablet   sitaGLIPtin (JANUVIA) 100 MG tablet   Osteopenia    Discussed calcium / vit D dosing, and regular weight bearing exercises.       Rash and nonspecific skin eruption    Rash on lower calf ?erythema migrans - after bug bite ?tick. Discussed symptoms  of tick borne illness, if spreading rash pt will start doxy 10d course.       Systolic murmur    Chronic, stable.       Welcome to Medicare preventive visit - Primary    I have personally reviewed the Medicare Annual Wellness questionnaire and have noted 1. The patient's medical and social history 2. Their use of alcohol, tobacco or illicit drugs 3. Their current medications and supplements 4. The patient's functional ability including ADL's, fall risks, home safety risks and hearing or visual impairment. Cognitive function has been assessed and addressed as indicated.  5. Diet and physical activity 6. Evidence for depression or mood disorders The patients weight, height, BMI have been recorded in the chart. I have made referrals, counseling and provided education to the patient based on review of the above and I have provided the pt with a written personalized care plan for preventive services. Provider list updated.. See scanned questionairre as needed for further documentation. Reviewed preventative protocols and updated unless pt declined.       Relevant Orders   EKG 12-Lead   Cytology - PAP [place]    Other Visit Diagnoses    Breast cancer screening       Relevant Orders   MM Digital Screening   Bruit of left carotid artery       Relevant Orders   US Carotid Duplex Bilateral       Follow up plan: Return in about 6 months (around 05/04/2017) for follow up visit.  Ria Bush, MD

## 2016-11-01 NOTE — Assessment & Plan Note (Addendum)
Chronic off meds. Pt agrees to try low potency statin - pravastatin sent in. H/o lipitor intolerance The 10-year ASCVD risk score Mikey Bussing DC Jr., et al., 2013) is: 12.9%   Values used to calculate the score:     Age: 71 years     Sex: Female     Is Non-Hispanic African American: No     Diabetic: Yes     Tobacco smoker: No     Systolic Blood Pressure: 872 mmHg     Is BP treated: No     HDL Cholesterol: 45.2 mg/dL     Total Cholesterol: 212 mg/dL

## 2016-11-01 NOTE — Assessment & Plan Note (Signed)
Rash on lower calf ?erythema migrans - after bug bite ?tick. Discussed symptoms of tick borne illness, if spreading rash pt will start doxy 10d course.

## 2016-11-01 NOTE — Assessment & Plan Note (Signed)
Discussed calcium / vit D dosing, and regular weight bearing exercises.

## 2016-11-01 NOTE — Patient Instructions (Addendum)
We will refer you for mammogram at [facility]. Pelvic exam/pap today.  If interested, check with pharmacy about new 2 shot shingles series (shingrix). If any new symptoms develop, start 10 day doxycycline course.  Bring me copy of your living will.  Return as needed or in 6 months for diabetes follow up visit.  Trial pravastatin 40mg  once daily for cholesterol - sent to pharmacy. We will set you up for carotid ultrasound.  Health Maintenance, Female Adopting a healthy lifestyle and getting preventive care can go a long way to promote health and wellness. Talk with your health care provider about what schedule of regular examinations is right for you. This is a good chance for you to check in with your provider about disease prevention and staying healthy. In between checkups, there are plenty of things you can do on your own. Experts have done a lot of research about which lifestyle changes and preventive measures are most likely to keep you healthy. Ask your health care provider for more information. Weight and diet Eat a healthy diet  Be sure to include plenty of vegetables, fruits, low-fat dairy products, and lean protein.  Do not eat a lot of foods high in solid fats, added sugars, or salt.  Get regular exercise. This is one of the most important things you can do for your health. ? Most adults should exercise for at least 150 minutes each week. The exercise should increase your heart rate and make you sweat (moderate-intensity exercise). ? Most adults should also do strengthening exercises at least twice a week. This is in addition to the moderate-intensity exercise.  Maintain a healthy weight  Body mass index (BMI) is a measurement that can be used to identify possible weight problems. It estimates body fat based on height and weight. Your health care provider can help determine your BMI and help you achieve or maintain a healthy weight.  For females 43 years of age and  older: ? A BMI below 18.5 is considered underweight. ? A BMI of 18.5 to 24.9 is normal. ? A BMI of 25 to 29.9 is considered overweight. ? A BMI of 30 and above is considered obese.  Watch levels of cholesterol and blood lipids  You should start having your blood tested for lipids and cholesterol at 69 years of age, then have this test every 5 years.  You may need to have your cholesterol levels checked more often if: ? Your lipid or cholesterol levels are high. ? You are older than 69 years of age. ? You are at high risk for heart disease.  Cancer screening Lung Cancer  Lung cancer screening is recommended for adults 58-59 years old who are at high risk for lung cancer because of a history of smoking.  A yearly low-dose CT scan of the lungs is recommended for people who: ? Currently smoke. ? Have quit within the past 15 years. ? Have at least a 30-pack-year history of smoking. A pack year is smoking an average of one pack of cigarettes a day for 1 year.  Yearly screening should continue until it has been 15 years since you quit.  Yearly screening should stop if you develop a health problem that would prevent you from having lung cancer treatment.  Breast Cancer  Practice breast self-awareness. This means understanding how your breasts normally appear and feel.  It also means doing regular breast self-exams. Let your health care provider know about any changes, no matter how small.  If you are in your 20s or 30s, you should have a clinical breast exam (CBE) by a health care provider every 1-3 years as part of a regular health exam.  If you are 33 or older, have a CBE every year. Also consider having a breast X-ray (mammogram) every year.  If you have a family history of breast cancer, talk to your health care provider about genetic screening.  If you are at high risk for breast cancer, talk to your health care provider about having an MRI and a mammogram every year.  Breast  cancer gene (BRCA) assessment is recommended for women who have family members with BRCA-related cancers. BRCA-related cancers include: ? Breast. ? Ovarian. ? Tubal. ? Peritoneal cancers.  Results of the assessment will determine the need for genetic counseling and BRCA1 and BRCA2 testing.  Cervical Cancer Your health care provider may recommend that you be screened regularly for cancer of the pelvic organs (ovaries, uterus, and vagina). This screening involves a pelvic examination, including checking for microscopic changes to the surface of your cervix (Pap test). You may be encouraged to have this screening done every 3 years, beginning at age 39.  For women ages 79-65, health care providers may recommend pelvic exams and Pap testing every 3 years, or they may recommend the Pap and pelvic exam, combined with testing for human papilloma virus (HPV), every 5 years. Some types of HPV increase your risk of cervical cancer. Testing for HPV may also be done on women of any age with unclear Pap test results.  Other health care providers may not recommend any screening for nonpregnant women who are considered low risk for pelvic cancer and who do not have symptoms. Ask your health care provider if a screening pelvic exam is right for you.  If you have had past treatment for cervical cancer or a condition that could lead to cancer, you need Pap tests and screening for cancer for at least 20 years after your treatment. If Pap tests have been discontinued, your risk factors (such as having a new sexual partner) need to be reassessed to determine if screening should resume. Some women have medical problems that increase the chance of getting cervical cancer. In these cases, your health care provider may recommend more frequent screening and Pap tests.  Colorectal Cancer  This type of cancer can be detected and often prevented.  Routine colorectal cancer screening usually begins at 69 years of age and  continues through 69 years of age.  Your health care provider may recommend screening at an earlier age if you have risk factors for colon cancer.  Your health care provider may also recommend using home test kits to check for hidden blood in the stool.  A small camera at the end of a tube can be used to examine your colon directly (sigmoidoscopy or colonoscopy). This is done to check for the earliest forms of colorectal cancer.  Routine screening usually begins at age 63.  Direct examination of the colon should be repeated every 5-10 years through 69 years of age. However, you may need to be screened more often if early forms of precancerous polyps or small growths are found.  Skin Cancer  Check your skin from head to toe regularly.  Tell your health care provider about any new moles or changes in moles, especially if there is a change in a mole's shape or color.  Also tell your health care provider if you have a mole that is  larger than the size of a pencil eraser.  Always use sunscreen. Apply sunscreen liberally and repeatedly throughout the day.  Protect yourself by wearing long sleeves, pants, a wide-brimmed hat, and sunglasses whenever you are outside.  Heart disease, diabetes, and high blood pressure  High blood pressure causes heart disease and increases the risk of stroke. High blood pressure is more likely to develop in: ? People who have blood pressure in the high end of the normal range (130-139/85-89 mm Hg). ? People who are overweight or obese. ? People who are African American.  If you are 38-43 years of age, have your blood pressure checked every 3-5 years. If you are 12 years of age or older, have your blood pressure checked every year. You should have your blood pressure measured twice-once when you are at a hospital or clinic, and once when you are not at a hospital or clinic. Record the average of the two measurements. To check your blood pressure when you are not  at a hospital or clinic, you can use: ? An automated blood pressure machine at a pharmacy. ? A home blood pressure monitor.  If you are between 64 years and 51 years old, ask your health care provider if you should take aspirin to prevent strokes.  Have regular diabetes screenings. This involves taking a blood sample to check your fasting blood sugar level. ? If you are at a normal weight and have a low risk for diabetes, have this test once every three years after 69 years of age. ? If you are overweight and have a high risk for diabetes, consider being tested at a younger age or more often. Preventing infection Hepatitis B  If you have a higher risk for hepatitis B, you should be screened for this virus. You are considered at high risk for hepatitis B if: ? You were born in a country where hepatitis B is common. Ask your health care provider which countries are considered high risk. ? Your parents were born in a high-risk country, and you have not been immunized against hepatitis B (hepatitis B vaccine). ? You have HIV or AIDS. ? You use needles to inject street drugs. ? You live with someone who has hepatitis B. ? You have had sex with someone who has hepatitis B. ? You get hemodialysis treatment. ? You take certain medicines for conditions, including cancer, organ transplantation, and autoimmune conditions.  Hepatitis C  Blood testing is recommended for: ? Everyone born from [birth year] through 1963. ? Anyone with known risk factors for hepatitis C.  Sexually transmitted infections (STIs)  You should be screened for sexually transmitted infections (STIs) including gonorrhea and chlamydia if: ? You are sexually active and are younger than 69 years of age. ? You are older than 69 years of age and your health care provider tells you that you are at risk for this type of infection. ? Your sexual activity has changed since you were last screened and you are at an increased risk for chlamydia  or gonorrhea. Ask your health care provider if you are at risk.  If you do not have HIV, but are at risk, it may be recommended that you take a prescription medicine daily to prevent HIV infection. This is called pre-exposure prophylaxis (PrEP). You are considered at risk if: ? You are sexually active and do not regularly use condoms or know the HIV status of your partner(s). ? You take drugs by injection. ? You are sexually active  with a partner who has HIV.  Talk with your health care provider about whether you are at high risk of being infected with HIV. If you choose to begin PrEP, you should first be tested for HIV. You should then be tested every 3 months for as long as you are taking PrEP. Pregnancy  If you are premenopausal and you may become pregnant, ask your health care provider about preconception counseling.  If you may become pregnant, take 400 to 800 micrograms (mcg) of folic acid every day.  If you want to prevent pregnancy, talk to your health care provider about birth control (contraception). Osteoporosis and menopause  Osteoporosis is a disease in which the bones lose minerals and strength with aging. This can result in serious bone fractures. Your risk for osteoporosis can be identified using a bone density scan.  If you are 67 years of age or older, or if you are at risk for osteoporosis and fractures, ask your health care provider if you should be screened.  Ask your health care provider whether you should take a calcium or vitamin D supplement to lower your risk for osteoporosis.  Menopause may have certain physical symptoms and risks.  Hormone replacement therapy may reduce some of these symptoms and risks. Talk to your health care provider about whether hormone replacement therapy is right for you. Follow these instructions at home:  Schedule regular health, dental, and eye exams.  Stay current with your immunizations.  Do not use any tobacco products  including cigarettes, chewing tobacco, or electronic cigarettes.  If you are pregnant, do not drink alcohol.  If you are breastfeeding, limit how much and how often you drink alcohol.  Limit alcohol intake to no more than 1 drink per day for nonpregnant women. One drink equals 12 ounces of beer, 5 ounces of wine, or 1 ounces of hard liquor.  Do not use street drugs.  Do not share needles.  Ask your health care provider for help if you need support or information about quitting drugs.  Tell your health care provider if you often feel depressed.  Tell your health care provider if you have ever been abused or do not feel safe at home. This information is not intended to replace advice given to you by your health care provider. Make sure you discuss any questions you have with your health care provider. Document Released: 10/19/2010 Document Revised: 09/11/2015 Document Reviewed: 01/07/2015 Elsevier Interactive Patient Education  [employer].

## 2016-11-01 NOTE — Assessment & Plan Note (Signed)
Chronic, stable. Continue current regimen. 

## 2016-11-01 NOTE — Assessment & Plan Note (Signed)
Advanced planning - has set up. Sister is HCPOA. Asked to bring [place] copy.

## 2016-11-02 LAB — CYTOLOGY - PAP
DIAGNOSIS: NEGATIVE
HPV (WINDOPATH): NOT DETECTED

## 2016-11-03 NOTE — Addendum Note (Signed)
Addended by: Ria Bush on: 11/03/2016 09:24 AM   Modules accepted: Orders

## 2016-11-17 HISTORY — PX: BELPHAROPTOSIS REPAIR: SHX369

## 2016-11-23 ENCOUNTER — Ambulatory Visit: Payer: PPO

## 2016-11-23 DIAGNOSIS — R0989 Other specified symptoms and signs involving the circulatory and respiratory systems: Secondary | ICD-10-CM | POA: Diagnosis not present

## 2016-11-23 LAB — VAS US CAROTID
LCCADDIAS: 27 cm/s
LCCADSYS: 115 cm/s
LEFT ECA DIAS: -12 cm/s
LEFT VERTEBRAL DIAS: -15 cm/s
LICADDIAS: -28 cm/s
LICADSYS: -84 cm/s
Left CCA prox dias: 22 cm/s
Left CCA prox sys: 139 cm/s
Left ICA prox dias: -19 cm/s
Left ICA prox sys: -87 cm/s
RCCADSYS: -76 cm/s
RCCAPSYS: 127 cm/s
RIGHT ECA DIAS: -6 cm/s
RIGHT VERTEBRAL DIAS: 11 cm/s
Right CCA prox dias: 22 cm/s

## 2017-01-14 ENCOUNTER — Ambulatory Visit
Admission: RE | Admit: 2017-01-14 | Discharge: 2017-01-14 | Disposition: A | Discharge status: Home / Self Care | Payer: PPO | Source: Ambulatory Visit | Attending: Family Medicine | Admitting: Family Medicine

## 2017-01-14 DIAGNOSIS — Z1231 Encounter for screening mammogram for malignant neoplasm of breast: Secondary | ICD-10-CM | POA: Diagnosis not present

## 2017-01-14 DIAGNOSIS — Z1239 Encounter for other screening for malignant neoplasm of breast: Secondary | ICD-10-CM

## 2017-01-27 ENCOUNTER — Other Ambulatory Visit: Payer: Self-pay | Admitting: *Deleted

## 2017-01-27 ENCOUNTER — Inpatient Hospital Stay
Admission: RE | Admit: 2017-01-27 | Discharge: 2017-01-27 | Disposition: A | Discharge status: Home / Self Care | Payer: Self-pay | Source: Ambulatory Visit | Attending: *Deleted | Admitting: *Deleted

## 2017-01-27 DIAGNOSIS — Z9289 Personal history of other medical treatment: Secondary | ICD-10-CM

## 2017-01-27 LAB — HM MAMMOGRAPHY

## 2017-01-31 ENCOUNTER — Encounter: Payer: Self-pay | Admitting: Family Medicine

## 2017-05-30 ENCOUNTER — Other Ambulatory Visit: Payer: Self-pay | Admitting: Family Medicine

## 2017-05-30 DIAGNOSIS — E785 Hyperlipidemia, unspecified: Secondary | ICD-10-CM

## 2017-05-30 DIAGNOSIS — E119 Type 2 diabetes mellitus without complications: Secondary | ICD-10-CM

## 2017-06-01 ENCOUNTER — Other Ambulatory Visit (INDEPENDENT_AMBULATORY_CARE_PROVIDER_SITE_OTHER): Payer: PPO

## 2017-06-01 DIAGNOSIS — E119 Type 2 diabetes mellitus without complications: Secondary | ICD-10-CM | POA: Diagnosis not present

## 2017-06-01 DIAGNOSIS — E785 Hyperlipidemia, unspecified: Secondary | ICD-10-CM | POA: Diagnosis not present

## 2017-06-01 LAB — COMPREHENSIVE METABOLIC PANEL
ALBUMIN: 4.2 g/dL (ref 3.5–5.2)
ALK PHOS: 66 U/L (ref 39–117)
ALT: 18 U/L (ref 0–35)
AST: 22 U/L (ref 0–37)
BILIRUBIN TOTAL: 0.5 mg/dL (ref 0.2–1.2)
BUN: 14 mg/dL (ref 6–23)
CALCIUM: 9.5 mg/dL (ref 8.4–10.5)
CHLORIDE: 104 meq/L (ref 96–112)
CO2: 26 mEq/L (ref 19–32)
CREATININE: 0.78 mg/dL (ref 0.40–1.20)
GFR: 77.73 mL/min (ref >60.00)
Glucose, Bld: 128 mg/dL — ABNORMAL HIGH (ref 70–99)
Potassium: 4.4 mEq/L (ref 3.5–5.1)
SODIUM: 139 meq/L (ref 135–145)
TOTAL PROTEIN: 7.3 g/dL (ref 6.0–8.3)

## 2017-06-01 LAB — LIPID PANEL
CHOLESTEROL: 185 mg/dL (ref 0–200)
HDL: 40.7 mg/dL (ref >39.00)
LDL CALC: 122 mg/dL — AB (ref 0–99)
NonHDL: 144.71
Total CHOL/HDL Ratio: 5
Triglycerides: 112 mg/dL (ref 0.0–149.0)
VLDL: 22.4 mg/dL (ref 0.0–40.0)

## 2017-06-01 LAB — HEMOGLOBIN A1C: Hgb A1c MFr Bld: 5.9 % (ref 4.6–6.5)

## 2017-06-02 LAB — HM DIABETES EYE EXAM

## 2017-06-03 ENCOUNTER — Encounter: Payer: Self-pay | Admitting: Family Medicine

## 2017-06-03 ENCOUNTER — Ambulatory Visit (INDEPENDENT_AMBULATORY_CARE_PROVIDER_SITE_OTHER): Payer: PPO | Admitting: Family Medicine

## 2017-06-03 VITALS — BP 120/62 | HR 55 | Temp 97.8°F | Wt 166.0 lb

## 2017-06-03 DIAGNOSIS — R011 Cardiac murmur, unspecified: Secondary | ICD-10-CM

## 2017-06-03 DIAGNOSIS — E119 Type 2 diabetes mellitus without complications: Secondary | ICD-10-CM | POA: Diagnosis not present

## 2017-06-03 DIAGNOSIS — E785 Hyperlipidemia, unspecified: Secondary | ICD-10-CM | POA: Diagnosis not present

## 2017-06-03 DIAGNOSIS — E663 Overweight: Secondary | ICD-10-CM

## 2017-06-03 MED ORDER — ONETOUCH DELICA LANCETS 33G MISC: 6 refills | Status: AC

## 2017-06-03 MED ORDER — GLUCOSE BLOOD VI STRP: ORAL_STRIP | 6 refills | Status: DC

## 2017-06-03 NOTE — Assessment & Plan Note (Signed)
Congratulated on weight loss to date through healthy diet .

## 2017-06-03 NOTE — Assessment & Plan Note (Signed)
Chronic, stable off statin. Intolerance to lipitor and pravastatin. Will try another several months of TLC.

## 2017-06-03 NOTE — Patient Instructions (Addendum)
Congratulations on sugar control with healthy diet and lifestyle changes! Keep up the good work! Return in 6 months for physical.

## 2017-06-03 NOTE — Progress Notes (Addendum)
BP 120/62 (BP Location: Left Arm, Patient Position: Sitting, Cuff Size: Normal)   Pulse (!) 55   Temp 97.8 F (36.6 C) (Oral)   Wt 166 lb (75.3 kg)   SpO2 99%   BMI 28.72 kg/m    CC: DM f/u visit Subjective:    Patient ID: Betty Sanders, female    DOB: 04/26/1945, 72 y.o.   MRN: [id number]  HPI: Betty Sanders is a 72 y.o. female presenting on 06/03/2017 for 6 mo follow-up   Husband passed away mid Jun 10, 2016 DM - does regularly check sugars TID before meals - . Compliant with antihyperglycemic regimen which includes: metformin 566m once daily. January started "whole 30 diet" no sugars for 30 days. Exercising 30 min on treadmill every day. She joined gym. She stopped j[place]a amaryl. Januvia was too expensive. amaryl was stopped 1 month ago due to hypoglycemia. Denies low sugars or hypoglycemic symptoms since stopping amaryl. Denies paresthesias. Last diabetic eye exam yesterday. Pneumovax: 2February 22, 2014 Prevnar: 202-22-16 Glucometer brand: [employer]. DSME: completed when initial diagnosis was made.  Lab Results  Component Value Date   HGBA1C 5.9 06/01/2017   Diabetic Foot Exam - Simple   Simple Foot Form Diabetic Foot exam was performed with the following findings:  Yes 06/03/2017  9:33 AM  Visual Inspection No deformities, no ulcerations, no other skin breakdown bilaterally:  Yes Sensation Testing Intact to touch and monofilament testing bilaterally:  Yes Pulse Check Posterior Tibialis and Dorsalis pulse intact bilaterally:  Yes Comments Dry skin at soles    Lab Results  Component Value Date   MICROALBUR <0.7 10/28/2016     HLD - off pravastatin - due to aches/pains  Relevant past medical, surgical, family and social history reviewed and updated as indicated. Interim medical history since our last visit reviewed. Allergies and medications reviewed and updated. Outpatient Medications Prior to Visit  Medication Sig Dispense Refill  . aspirin (ASPIRIN EC) 81 MG EC tablet  Take 81 mg by mouth daily. Reported on 06/03/2015    . Blood Glucose Monitoring Suppl (ONETOUCH VERIO IQ SYSTEM) w/Device KIT Check blood sugar twice a day and as directed. Dx E11.9 1 kit 0  . co-enzyme Q-10 30 MG capsule Take 30 mg by mouth daily.    . metFORMIN (GLUCOPHAGE) 500 MG tablet Take 1 tablet (500 mg total) by mouth daily with breakfast.    . naproxen (NAPROSYN) 500 MG tablet TAKE 1 TABLET BY MOUTH TWICE A DAY FOR 1 WEEK THEN AS NEEDED FOR PAIN, TAKE WITH FOOD 50 tablet 0  . vitamin C (ASCORBIC ACID) 500 MG tablet Take 500 mg by mouth as needed.    .Marland Kitchenglucose blood (ONETOUCH VERIO) test strip Check blood sugar twice a day and as directed. Dx E11.9 100 each 5  . metFORMIN (GLUCOPHAGE) 500 MG tablet Take 2 tablets in am and 1 at night (Patient taking differently: Takes 1 tablet daily) 270 tablet 3  . ONETOUCH DELICA LANCETS 348NMISC Check blood sugar twice a day and as directed. Dx E11.9 100 each 5  . pravastatin (PRAVACHOL) 40 MG tablet Take 1 tablet (40 mg total) by mouth daily. 90 tablet 3  . cyclobenzaprine (FLEXERIL) 10 MG tablet Take 1 tablet (10 mg total) by mouth 2 (two) times daily as needed for muscle spasms. 30 tablet 0  . doxycycline (VIBRA-TABS) 100 MG tablet Take 1 tablet (100 mg total) by mouth 2 (two) times daily. 20 tablet 0  . glimepiride (AMARYL) 4 MG  tablet Take 1 tablet (4 mg total) by mouth daily with breakfast. (Patient not taking: Reported on 06/03/2017) 90 tablet 3  . sitaGLIPtin (JANUVIA) 100 MG tablet Take 1 tablet (100 mg total) by mouth daily. (Patient not taking: Reported on 06/03/2017) 90 tablet 3   No facility-administered medications prior to visit.      Per HPI unless specifically indicated in ROS section below Review of Systems     Objective:    BP 120/62 (BP Location: Left Arm, Patient Position: Sitting, Cuff Size: Normal)   Pulse (!) 55   Temp 97.8 F (36.6 C) (Oral)   Wt 166 lb (75.3 kg)   SpO2 99%   BMI 28.72 kg/m   Wt Readings from Last 3  Encounters:  06/03/17 166 lb (75.3 kg)  11/01/16 176 lb 12 oz (80.2 kg)  07/01/16 172 lb 4 oz (78.1 kg)    Physical Exam  Constitutional: She appears well-developed and well-nourished. No distress.  HENT:  Head: Normocephalic and atraumatic.  Right Ear: External ear normal.  Left Ear: External ear normal.  Nose: Nose normal.  Mouth/Throat: Oropharynx is clear and moist. No oropharyngeal exudate.  Eyes: Conjunctivae and EOM are normal. Pupils are equal, round, and reactive to light. No scleral icterus.  Neck: Normal range of motion. Neck supple.  Cardiovascular: Normal rate, regular rhythm and intact distal pulses.  Murmur (3/6 systolic) heard. Pulmonary/Chest: Effort normal and breath sounds normal. No respiratory distress. She has no wheezes. She has no rales.  Musculoskeletal: She exhibits no edema.  See HPI for foot exam if done  Lymphadenopathy:    She has no cervical adenopathy.  Skin: Skin is warm and dry. No rash noted.  Psychiatric: She has a normal mood and affect.  Nursing note and vitals reviewed.  Results for orders placed or performed in visit on 06/03/17  HM DIABETES EYE EXAM  Result Value Ref Range   HM Diabetic Eye Exam No Retinopathy No Retinopathy      Assessment & Plan:   Problem List Items Addressed This Visit    Diabetes mellitus type 2, controlled, without complications ([place]) - Primary    Wonderful control on whole 30 diet.  Sustainable changes.  Has been able to come off several medicines.  Congratulated.  Continue healthy changes.       Relevant Medications   metFORMIN (GLUCOPHAGE) 500 MG tablet   Dyslipidemia    Chronic, stable off statin. Intolerance to lipitor and pravastatin. Will try another several months of TLC.       Overweight    Congratulated on weight loss to date through healthy diet .      Systolic murmur    persistent          Meds ordered this encounter  Medications  . glucose blood (ONETOUCH VERIO) test strip     Sig: Check blood sugar twice a day and as directed. Dx E11.9    Dispense:  100 each    Refill:  6  . ONETOUCH DELICA LANCETS 83M MISC    Sig: Check blood sugar twice a day and as directed. Dx E11.9    Dispense:  100 each    Refill:  6   Orders Placed This Encounter  Procedures  . [facility] DIABETES EYE EXAM    This external order was created through the Results Console.    Follow up plan: Return in about 6 months (around 12/01/2017) for annual exam, prior fasting for blood work.  Ria Bush, MD

## 2017-06-03 NOTE — Assessment & Plan Note (Signed)
Wonderful control on whole 30 diet.  Sustainable changes.  Has been able to come off several medicines.  Congratulated.  Continue healthy changes.

## 2017-06-03 NOTE — Assessment & Plan Note (Signed)
persistent

## 2017-09-17 HISTORY — PX: DENTAL SURGERY: SHX609

## 2018-01-04 ENCOUNTER — Other Ambulatory Visit: Payer: Self-pay | Admitting: Family Medicine

## 2018-01-04 DIAGNOSIS — E119 Type 2 diabetes mellitus without complications: Secondary | ICD-10-CM

## 2018-01-04 DIAGNOSIS — E785 Hyperlipidemia, unspecified: Secondary | ICD-10-CM

## 2018-01-04 DIAGNOSIS — K760 Fatty (change of) liver, not elsewhere classified: Secondary | ICD-10-CM

## 2018-01-05 ENCOUNTER — Other Ambulatory Visit (INDEPENDENT_AMBULATORY_CARE_PROVIDER_SITE_OTHER): Payer: PPO

## 2018-01-05 DIAGNOSIS — E119 Type 2 diabetes mellitus without complications: Secondary | ICD-10-CM

## 2018-01-05 DIAGNOSIS — E785 Hyperlipidemia, unspecified: Secondary | ICD-10-CM | POA: Diagnosis not present

## 2018-01-05 LAB — COMPREHENSIVE METABOLIC PANEL
ALK PHOS: 89 U/L (ref 39–117)
ALT: 14 U/L (ref 0–35)
AST: 18 U/L (ref 0–37)
Albumin: 4.1 g/dL (ref 3.5–5.2)
BUN: 13 mg/dL (ref 6–23)
CHLORIDE: 103 meq/L (ref 96–112)
CO2: 29 meq/L (ref 19–32)
Calcium: 9.4 mg/dL (ref 8.4–10.5)
Creatinine, Ser: 0.7 mg/dL (ref 0.40–1.20)
GFR: 87.91 mL/min (ref >60.00)
GLUCOSE: 151 mg/dL — AB (ref 70–99)
POTASSIUM: 4.1 meq/L (ref 3.5–5.1)
Sodium: 138 mEq/L (ref 135–145)
Total Bilirubin: 0.4 mg/dL (ref 0.2–1.2)
Total Protein: 7.1 g/dL (ref 6.0–8.3)

## 2018-01-05 LAB — MICROALBUMIN / CREATININE URINE RATIO
CREATININE, U: 33.1 mg/dL
Microalb Creat Ratio: 2.1 mg/g (ref 0.0–30.0)

## 2018-01-05 LAB — LIPID PANEL
CHOL/HDL RATIO: 4
Cholesterol: 193 mg/dL (ref 0–200)
HDL: 47.4 mg/dL (ref >39.00)
LDL Cholesterol: 116 mg/dL — ABNORMAL HIGH (ref 0–99)
NONHDL: 145.54
Triglycerides: 149 mg/dL (ref 0.0–149.0)
VLDL: 29.8 mg/dL (ref 0.0–40.0)

## 2018-01-05 LAB — HEMOGLOBIN A1C: HEMOGLOBIN A1C: 6.4 % (ref 4.6–6.5)

## 2018-01-10 ENCOUNTER — Ambulatory Visit (INDEPENDENT_AMBULATORY_CARE_PROVIDER_SITE_OTHER): Payer: PPO | Admitting: Family Medicine

## 2018-01-10 ENCOUNTER — Ambulatory Visit (INDEPENDENT_AMBULATORY_CARE_PROVIDER_SITE_OTHER): Payer: PPO

## 2018-01-10 ENCOUNTER — Encounter: Payer: Self-pay | Admitting: Family Medicine

## 2018-01-10 VITALS — BP 122/70 | HR 62 | Temp 98.4°F | Ht 64.25 in | Wt 164.8 lb

## 2018-01-10 DIAGNOSIS — Z23 Encounter for immunization: Secondary | ICD-10-CM

## 2018-01-10 DIAGNOSIS — M8580 Other specified disorders of bone density and structure, unspecified site: Secondary | ICD-10-CM

## 2018-01-10 DIAGNOSIS — E785 Hyperlipidemia, unspecified: Secondary | ICD-10-CM

## 2018-01-10 DIAGNOSIS — E118 Type 2 diabetes mellitus with unspecified complications: Secondary | ICD-10-CM

## 2018-01-10 DIAGNOSIS — Z7189 Other specified counseling: Secondary | ICD-10-CM | POA: Diagnosis not present

## 2018-01-10 DIAGNOSIS — Z0000 Encounter for general adult medical examination without abnormal findings: Secondary | ICD-10-CM | POA: Diagnosis not present

## 2018-01-10 MED ORDER — GLIMEPIRIDE 1 MG PO TABS: 1.0000 mg | ORAL_TABLET | Freq: Every day | ORAL | 3 refills | Status: DC

## 2018-01-10 MED ORDER — METFORMIN HCL 500 MG PO TABS: 1000.0000 mg | ORAL_TABLET | Freq: Every day | ORAL | 3 refills | Status: DC

## 2018-01-10 NOTE — Progress Notes (Signed)
BP 122/70 (BP Location: Left Arm, Patient Position: Sitting, Cuff Size: Normal)   Pulse 62   Temp 98.4 F (36.9 C) (Oral)   Ht 5' 4.25" (1.632 m)   Wt 164 lb 12 oz (74.7 kg)   SpO2 98%   BMI 28.06 kg/m    CC: CPE Subjective:    Patient ID: Betty Sanders, female    DOB: 1946-04-12, 72 y.o.   MRN: [id number]  HPI: Betty Sanders is a 72 y.o. female presenting on 01/10/2018 for Annual Exam (Part 2)   Saw Betty Sanders today - I spoke with her. Note will be reviewed.  Husband passed away 10-23-2016 - Betty Sanders after open heart surgery.  She bought [employer] that she has refurbished into camper and enjoys camping. DM - occasional lows. She has used whole 30 diet in the past  Preventative: COLONOSCOPY Date: 05/2015 diverticulosis, rpt 10 yrs Betty Sanders) Mammogram 01/2017, Birads 1.  Well woman exam - always normal.last 10/2016. [facility] (aunt) GYN cancer, unsure type. Would like to space out Q3 yrs.  DEXA - osteopenia 05/2014. Likes yogurt and ice cream. Weight bearing exercise reviewed.  Flu shot yearly Td 2009  Pneumovax 01/2013, prevnar 2016 Shingrix - discussed  Advanced planning - has set up. Sister is HCPOA. New packet provided today.  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 Activity: kayak, restarting treadmill, wears fit bit - wants to get to 10000steps/day Diet: My.fitness.pal 1200cal/day, good water, fruits/vegetables daily   Relevant past medical, surgical, family and social history reviewed and updated as indicated. Interim medical history since our last visit reviewed. Allergies and medications reviewed and updated. Outpatient Medications Prior to Visit  Medication Sig Dispense Refill  . aspirin (ASPIRIN EC) 81 MG EC  tablet Take 81 mg by mouth daily. Reported on 06/03/2015    . Blood Glucose Monitoring Suppl (ONETOUCH VERIO IQ SYSTEM) w/Device KIT Check blood sugar twice a day and as directed. Dx E11.9 1 kit 0  . glucose blood (ONETOUCH VERIO) test strip Check blood sugar twice a day and as directed. Dx E11.9 100 each 6  . naproxen (NAPROSYN) 500 MG tablet TAKE 1 TABLET BY MOUTH TWICE A DAY FOR 1 WEEK THEN AS NEEDED FOR PAIN, TAKE WITH FOOD 50 tablet 0  . ONETOUCH DELICA LANCETS 46E MISC Check blood sugar twice a day and as directed. Dx E11.9 100 each 6  . vitamin C (ASCORBIC ACID) 500 MG tablet Take 500 mg by mouth as needed.    Marland Kitchen glimepiride (AMARYL) 1 MG tablet Take 1 mg by mouth daily with breakfast.    . metFORMIN (GLUCOPHAGE) 500 MG tablet Take 1,000 mg by mouth daily with breakfast.      No facility-administered medications prior to visit.      Per HPI unless specifically indicated in ROS section below Review of Systems  Constitutional: Negative for activity change, appetite change, chills, fatigue, fever and unexpected weight change.  HENT: Positive for rhinorrhea. Negative for hearing loss.   Eyes: Negative for visual disturbance.  Respiratory: Negative for cough, chest tightness, shortness of breath and wheezing.   Cardiovascular: Negative for chest pain, palpitations and leg swelling.  Gastrointestinal: Negative for abdominal distention, abdominal pain, blood in stool, constipation, diarrhea, nausea and vomiting.  Genitourinary: Negative for difficulty urinating and hematuria.  Musculoskeletal: Negative for arthralgias, myalgias and neck pain.  Skin: Negative for rash.  Neurological: Negative for dizziness, seizures, syncope and headaches.  Hematological: Negative for adenopathy. Does not bruise/bleed easily.  Psychiatric/Behavioral: Negative for dysphoric mood. The patient is not nervous/anxious.        Objective:    BP 122/70 (BP Location: Left Arm, Patient Position: Sitting, Cuff Size:  Normal)   Pulse 62   Temp 98.4 F (36.9 C) (Oral)   Ht 5' 4.25" (1.632 m)   Wt 164 lb 12 oz (74.7 kg)   SpO2 98%   BMI 28.06 kg/m   Wt Readings from Last 3 Encounters:  01/10/18 164 lb 12 oz (74.7 kg)  01/10/18 164 lb 12 oz (74.7 kg)  06/03/17 166 lb (75.3 kg)    Physical Exam  Constitutional: She is oriented to person, place, and time. She appears well-developed and well-nourished. No distress.  HENT:  Head: Normocephalic and atraumatic.  Right Ear: Hearing, tympanic membrane, external ear and ear canal normal.  Left Ear: Hearing, tympanic membrane, external ear and ear canal normal.  Nose: Nose normal.  Mouth/Throat: Uvula is midline, oropharynx is clear and moist and mucous membranes are normal. No oropharyngeal exudate, posterior oropharyngeal edema or posterior oropharyngeal erythema.  Eyes: Pupils are equal, round, and reactive to light. Conjunctivae and EOM are normal. No scleral icterus.  Neck: Normal range of motion. Neck supple. Carotid bruit is not present. No thyromegaly present.  Cardiovascular: Normal rate, regular rhythm and intact distal pulses.  Murmur (3/6 at USB) heard. Pulses:      Radial pulses are 2+ on the right side, and 2+ on the left side.  Pulmonary/Chest: Effort normal and breath sounds normal. No respiratory distress. She has no wheezes. She has no rales.  Abdominal: Soft. Bowel sounds are normal. She exhibits no distension and no mass. There is no tenderness. There is no rebound and no guarding.  Musculoskeletal: Normal range of motion. She exhibits no edema.  Lymphadenopathy:    She has no cervical adenopathy.  Neurological: She is alert and oriented to person, place, and time.  CN grossly intact, station and gait intact  Skin: Skin is warm and dry. No rash noted.  Psychiatric: She has a normal mood and affect. Her behavior is normal. Judgment and thought content normal.  Nursing note and vitals reviewed.  Results for orders placed or performed in  visit on 01/05/18  Microalbumin / creatinine urine ratio  Result Value Ref Range   Microalb, Ur <0.7 0.0 - 1.9 mg/dL   Creatinine,U 33.1 mg/dL   Microalb Creat Ratio 2.1 0.0 - 30.0 mg/g  Hemoglobin A1c  Result Value Ref Range   Hgb A1c MFr Bld 6.4 4.6 - 6.5 %  Comprehensive metabolic panel  Result Value Ref Range   Sodium 138 135 - 145 mEq/L   Potassium 4.1 3.5 - 5.1 mEq/L   Chloride 103 96 - 112 mEq/L   CO2 29 19 - 32 mEq/L   Glucose, Bld 151 (H) 70 - 99 mg/dL   BUN 13 6 - 23 mg/dL   Creatinine, Ser 0.70 0.40 - 1.20 mg/dL   Total Bilirubin 0.4 0.2 - 1.2 mg/dL   Alkaline Phosphatase 89 39 - 117 U/L   AST 18 0 - 37 U/L   ALT 14 0 - 35 U/L   Total Protein 7.1 6.0 - 8.3 g/dL   Albumin 4.1 3.5 - 5.2 g/dL   Calcium 9.4  8.4 - 10.5 mg/dL   GFR 87.91 >60.00 mL/min  Lipid panel  Result Value Ref Range   Cholesterol 193 0 - 200 mg/dL   Triglycerides 149.0 0.0 - 149.0 mg/dL   HDL 47.40 >39.00 mg/dL   VLDL 29.8 0.0 - 40.0 mg/dL   LDL Cholesterol 116 (H) 0 - 99 mg/dL   Total CHOL/HDL Ratio 4    NonHDL 145.54       Assessment & Plan:   Problem List Items Addressed This Visit    Osteopenia    Discussed calcium/vit D use, encouraged regular weight bearing exercises.       Healthcare maintenance - Primary    Preventative protocols reviewed and updated unless pt declined. Discussed healthy diet and lifestyle.       Dyslipidemia    Chronic, off statin. lipitor may have caused myaligas. LDL not at goal. Consider lower potency statin. The 10-year ASCVD risk score Mikey Bussing DC Brooke Bonito., et al., 2013) is: 16.4%   Values used to calculate the score:     Age: 58 years     Sex: Female     Is Non-Hispanic African American: No     Diabetic: Yes     Tobacco smoker: No     Systolic Blood Pressure: 015 mmHg     Is BP treated: No     HDL Cholesterol: 47.4 mg/dL     Total Cholesterol: 193 mg/dL       Controlled diabetes mellitus type 2 with complications (HCC)    Chronic, stable. Great control  after weight loss. Continue current regimen. Endorsing some lows - advised to hold amaryl on days she expects to be more active.       Relevant Medications   metFORMIN (GLUCOPHAGE) 500 MG tablet   glimepiride (AMARYL) 1 MG tablet   Advanced care planning/counseling discussion    Advanced planning - has set up. Sister is HCPOA. New packet provided today.           Meds ordered this encounter  Medications  . metFORMIN (GLUCOPHAGE) 500 MG tablet    Sig: Take 2 tablets (1,000 mg total) by mouth daily with breakfast.    Dispense:  180 tablet    Refill:  3  . glimepiride (AMARYL) 1 MG tablet    Sig: Take 1 tablet (1 mg total) by mouth daily with breakfast.    Dispense:  90 tablet    Refill:  3   No orders of the defined types were placed in this encounter.   Follow up plan: No follow-ups on file.  Ria Bush, MD

## 2018-01-10 NOTE — Patient Instructions (Signed)
Betty Sanders , Thank you for taking time to come for your Medicare Wellness Visit. I appreciate your ongoing commitment to your health goals. Please review the following plan we discussed and let me know if I can assist you in the future.   These are the goals we discussed: Goals    . DIET - INCREASE WATER INTAKE     Starting 01/10/2018, I will continue to drink 2-3 liters of water daily.        This is a list of the screening recommended for you and due dates:  Health Maintenance  Topic Date Due  . DTaP/Tdap/Td vaccine (1 - Tdap) 01/17/2019*  . Tetanus Vaccine  01/17/2019*  . Mammogram  01/27/2018  . Eye exam for diabetics  06/02/2018  . Complete foot exam   06/03/2018  . Hemoglobin A1C  07/06/2018  . Urine Protein Check  01/06/2019  . Colon Cancer Screening  06/02/2025  . Flu Shot  Completed  . DEXA scan (bone density measurement)  Completed  .  Hepatitis C: One time screening is recommended by Center for Disease Control  (CDC) for  adults born from [birth year] through 1963.   Completed  . Pneumonia vaccines  Completed  *Topic was postponed. The date shown is not the original due date.   Preventive Care for Adults  A healthy lifestyle and preventive care can promote health and wellness. Preventive health guidelines for adults include the following key practices.  . A routine yearly physical is a good way to check with your health care provider about your health and preventive screening. It is a chance to share any concerns and updates on your health and to receive a thorough exam.  . Visit your dentist for a routine exam and preventive care every 6 months. Brush your teeth twice a day and floss once a day. Good oral hygiene prevents tooth decay and gum disease.  . The frequency of eye exams is based on your age, health, family medical history, use  of contact lenses, and other factors. Follow your health care provider's recommendations for frequency of eye exams.  . Eat a healthy  diet. Foods like vegetables, fruits, whole grains, low-fat dairy products, and lean protein foods contain the nutrients you need without too many calories. Decrease your intake of foods high in solid fats, added sugars, and salt. Eat the right amount of calories for you. Get information about a proper diet from your health care provider, if necessary.  . Regular physical exercise is one of the most important things you can do for your health. Most adults should get at least 150 minutes of moderate-intensity exercise (any activity that increases your heart rate and causes you to sweat) each week. In addition, most adults need muscle-strengthening exercises on 2 or more days a week.  Silver Sneakers may be a benefit available to you. To determine eligibility, you may visit the website: www.silversneakers.com or contact program at 228-591-8074 Mon-Fri between 8AM-8PM.   . Maintain a healthy weight. The body mass index (BMI) is a screening tool to identify possible weight problems. It provides an estimate of body fat based on height and weight. Your health care provider can find your BMI and can help you achieve or maintain a healthy weight.   For adults 20 years and older: ? A BMI below 18.5 is considered underweight. ? A BMI of 18.5 to 24.9 is normal. ? A BMI of 25 to 29.9 is considered overweight. ? A BMI of 30 and  above is considered obese.   . Maintain normal blood lipids and cholesterol levels by exercising and minimizing your intake of saturated fat. Eat a balanced diet with plenty of fruit and vegetables. Blood tests for lipids and cholesterol should begin at age 75 and be repeated every 5 years. If your lipid or cholesterol levels are high, you are over 50, or you are at high risk for heart disease, you may need your cholesterol levels checked more frequently. Ongoing high lipid and cholesterol levels should be treated with medicines if diet and exercise are not working.  . If you smoke, find  out from your health care provider how to quit. If you do not use tobacco, please do not start.  . If you choose to drink alcohol, please do not consume more than 2 drinks per day. One drink is considered to be 12 ounces (355 mL) of beer, 5 ounces (148 mL) of wine, or 1.5 ounces (44 mL) of liquor.  . If you are 31-35 years old, ask your health care provider if you should take aspirin to prevent strokes.  . Use sunscreen. Apply sunscreen liberally and repeatedly throughout the day. You should seek shade when your shadow is shorter than you. Protect yourself by wearing long sleeves, pants, a wide-brimmed hat, and sunglasses year round, whenever you are outdoors.  . Once a month, do a whole body skin exam, using a mirror to look at the skin on your back. Tell your health care provider of new moles, moles that have irregular borders, moles that are larger than a pencil eraser, or moles that have changed in shape or color.

## 2018-01-10 NOTE — Assessment & Plan Note (Signed)
Discussed calcium/vit D use, encouraged regular weight bearing exercises.

## 2018-01-10 NOTE — Assessment & Plan Note (Signed)
Chronic, stable. Great control after weight loss. Continue current regimen. Endorsing some lows - advised to hold amaryl on days she expects to be more active.

## 2018-01-10 NOTE — Progress Notes (Signed)
PCP notes:   Health maintenance:  Flu vaccine - administered Tetanus vaccine - postponed/insurance  Abnormal screenings:   Depression score: 2 Depression screen [facility] 2/9 01/10/2018 10/29/2013  Decreased Interest 1 0  Down, Depressed, Hopeless 0 0  PHQ - 2 Score 1 0  Altered sleeping 0 -  Tired, decreased energy 0 -  Change in appetite 1 -  Feeling bad or failure about yourself  0 -  Trouble concentrating 0 -  Moving slowly or fidgety/restless 0 -  Suicidal thoughts 0 -  PHQ-9 Score 2 -  Difficult doing work/chores Not difficult at all -    Patient concerns:   Requested medication refills. PCP notified.   Nurse concerns:  None  Next PCP appt:   01/10/18 @ 0930

## 2018-01-10 NOTE — Assessment & Plan Note (Signed)
Preventative protocols reviewed and updated unless pt declined. Discussed healthy diet and lifestyle.  

## 2018-01-10 NOTE — Patient Instructions (Addendum)
Flu shot today On active days, hold amaryl.  If interested, check with pharmacy about new 2 shot shingles series (shingrix).  You are doing well today - continue current medicines. Return as needed or in 6 months for diabetes check. Health Maintenance, Female Adopting a healthy lifestyle and getting preventive care can go a long way to promote health and wellness. Talk with your health care provider about what schedule of regular examinations is right for you. This is a good chance for you to check in with your provider about disease prevention and staying healthy. In between checkups, there are plenty of things you can do on your own. Experts have done a lot of research about which lifestyle changes and preventive measures are most likely to keep you healthy. Ask your health care provider for more information. Weight and diet Eat a healthy diet  Be sure to include plenty of vegetables, fruits, low-fat dairy products, and lean protein.  Do not eat a lot of foods high in solid fats, added sugars, or salt.  Get regular exercise. This is one of the most important things you can do for your health. ? Most adults should exercise for at least 150 minutes each week. The exercise should increase your heart rate and make you sweat (moderate-intensity exercise). ? Most adults should also do strengthening exercises at least twice a week. This is in addition to the moderate-intensity exercise.  Maintain a healthy weight  Body mass index (BMI) is a measurement that can be used to identify possible weight problems. It estimates body fat based on height and weight. Your health care provider can help determine your BMI and help you achieve or maintain a healthy weight.  For females 71 years of age and older: ? A BMI below 18.5 is considered underweight. ? A BMI of 18.5 to 24.9 is normal. ? A BMI of 25 to 29.9 is considered overweight. ? A BMI of 30 and above is considered obese.  Watch levels of  cholesterol and blood lipids  You should start having your blood tested for lipids and cholesterol at 70 years of age, then have this test every 5 years.  You may need to have your cholesterol levels checked more often if: ? Your lipid or cholesterol levels are high. ? You are older than 70 years of age. ? You are at high risk for heart disease.  Cancer screening Lung Cancer  Lung cancer screening is recommended for adults 34-13 years old who are at high risk for lung cancer because of a history of smoking.  A yearly low-dose CT scan of the lungs is recommended for people who: ? Currently smoke. ? Have quit within the past 15 years. ? Have at least a 30-pack-year history of smoking. A pack year is smoking an average of one pack of cigarettes a day for 1 year.  Yearly screening should continue until it has been 15 years since you quit.  Yearly screening should stop if you develop a health problem that would prevent you from having lung cancer treatment.  Breast Cancer  Practice breast self-awareness. This means understanding how your breasts normally appear and feel.  It also means doing regular breast self-exams. Let your health care provider know about any changes, no matter how small.  If you are in your 20s or 30s, you should have a clinical breast exam (CBE) by a health care provider every 1-3 years as part of a regular health exam.  If you are 40 or  older, have a CBE every year. Also consider having a breast X-ray (mammogram) every year.  If you have a family history of breast cancer, talk to your health care provider about genetic screening.  If you are at high risk for breast cancer, talk to your health care provider about having an MRI and a mammogram every year.  Breast cancer gene (BRCA) assessment is recommended for women who have family members with BRCA-related cancers. BRCA-related cancers include: ? Breast. ? Ovarian. ? Tubal. ? Peritoneal cancers.  Results  of the assessment will determine the need for genetic counseling and BRCA1 and BRCA2 testing.  Cervical Cancer Your health care provider may recommend that you be screened regularly for cancer of the pelvic organs (ovaries, uterus, and vagina). This screening involves a pelvic examination, including checking for microscopic changes to the surface of your cervix (Pap test). You may be encouraged to have this screening done every 3 years, beginning at age 69.  For women ages 46-65, health care providers may recommend pelvic exams and Pap testing every 3 years, or they may recommend the Pap and pelvic exam, combined with testing for human papilloma virus (HPV), every 5 years. Some types of HPV increase your risk of cervical cancer. Testing for HPV may also be done on women of any age with unclear Pap test results.  Other health care providers may not recommend any screening for nonpregnant women who are considered low risk for pelvic cancer and who do not have symptoms. Ask your health care provider if a screening pelvic exam is right for you.  If you have had past treatment for cervical cancer or a condition that could lead to cancer, you need Pap tests and screening for cancer for at least 20 years after your treatment. If Pap tests have been discontinued, your risk factors (such as having a new sexual partner) need to be reassessed to determine if screening should resume. Some women have medical problems that increase the chance of getting cervical cancer. In these cases, your health care provider may recommend more frequent screening and Pap tests.  Colorectal Cancer  This type of cancer can be detected and often prevented.  Routine colorectal cancer screening usually begins at 70 years of age and continues through 70 years of age.  Your health care provider may recommend screening at an earlier age if you have risk factors for colon cancer.  Your health care provider may also recommend using  home test kits to check for hidden blood in the stool.  A small camera at the end of a tube can be used to examine your colon directly (sigmoidoscopy or colonoscopy). This is done to check for the earliest forms of colorectal cancer.  Routine screening usually begins at age 93.  Direct examination of the colon should be repeated every 5-10 years through 70 years of age. However, you may need to be screened more often if early forms of precancerous polyps or small growths are found.  Skin Cancer  Check your skin from head to toe regularly.  Tell your health care provider about any new moles or changes in moles, especially if there is a change in a mole's shape or color.  Also tell your health care provider if you have a mole that is larger than the size of a pencil eraser.  Always use sunscreen. Apply sunscreen liberally and repeatedly throughout the day.  Protect yourself by wearing long sleeves, pants, a wide-brimmed hat, and sunglasses whenever you are outside.  Heart disease, diabetes, and high blood pressure  High blood pressure causes heart disease and increases the risk of stroke. High blood pressure is more likely to develop in: ? People who have blood pressure in the high end of the normal range (130-139/85-89 mm Hg). ? People who are overweight or obese. ? People who are African American.  If you are 13-66 years of age, have your blood pressure checked every 3-5 years. If you are 65 years of age or older, have your blood pressure checked every year. You should have your blood pressure measured twice-once when you are at a hospital or clinic, and once when you are not at a hospital or clinic. Record the average of the two measurements. To check your blood pressure when you are not at a hospital or clinic, you can use: ? An automated blood pressure machine at a pharmacy. ? A home blood pressure monitor.  If you are between 3 years and 60 years old, ask your health care provider  if you should take aspirin to prevent strokes.  Have regular diabetes screenings. This involves taking a blood sample to check your fasting blood sugar level. ? If you are at a normal weight and have a low risk for diabetes, have this test once every three years after 70 years of age. ? If you are overweight and have a high risk for diabetes, consider being tested at a younger age or more often. Preventing infection Hepatitis B  If you have a higher risk for hepatitis B, you should be screened for this virus. You are considered at high risk for hepatitis B if: ? You were born in a country where hepatitis B is common. Ask your health care provider which countries are considered high risk. ? Your parents were born in a high-risk country, and you have not been immunized against hepatitis B (hepatitis B vaccine). ? You have HIV or AIDS. ? You use needles to inject street drugs. ? You live with someone who has hepatitis B. ? You have had sex with someone who has hepatitis B. ? You get hemodialysis treatment. ? You take certain medicines for conditions, including cancer, organ transplantation, and autoimmune conditions.  Hepatitis C  Blood testing is recommended for: ? Everyone born from [birth year] through 1963. ? Anyone with known risk factors for hepatitis C.  Sexually transmitted infections (STIs)  You should be screened for sexually transmitted infections (STIs) including gonorrhea and chlamydia if: ? You are sexually active and are younger than 70 years of age. ? You are older than 70 years of age and your health care provider tells you that you are at risk for this type of infection. ? Your sexual activity has changed since you were last screened and you are at an increased risk for chlamydia or gonorrhea. Ask your health care provider if you are at risk.  If you do not have HIV, but are at risk, it may be recommended that you take a prescription medicine daily to prevent HIV infection. This  is called pre-exposure prophylaxis (PrEP). You are considered at risk if: ? You are sexually active and do not regularly use condoms or know the HIV status of your partner(s). ? You take drugs by injection. ? You are sexually active with a partner who has HIV.  Talk with your health care provider about whether you are at high risk of being infected with HIV. If you choose to begin PrEP, you should first be tested for HIV.  You should then be tested every 3 months for as long as you are taking PrEP. Pregnancy  If you are premenopausal and you may become pregnant, ask your health care provider about preconception counseling.  If you may become pregnant, take 400 to 800 micrograms (mcg) of folic acid every day.  If you want to prevent pregnancy, talk to your health care provider about birth control (contraception). Osteoporosis and menopause  Osteoporosis is a disease in which the bones lose minerals and strength with aging. This can result in serious bone fractures. Your risk for osteoporosis can be identified using a bone density scan.  If you are 11 years of age or older, or if you are at risk for osteoporosis and fractures, ask your health care provider if you should be screened.  Ask your health care provider whether you should take a calcium or vitamin D supplement to lower your risk for osteoporosis.  Menopause may have certain physical symptoms and risks.  Hormone replacement therapy may reduce some of these symptoms and risks. Talk to your health care provider about whether hormone replacement therapy is right for you. Follow these instructions at home:  Schedule regular health, dental, and eye exams.  Stay current with your immunizations.  Do not use any tobacco products including cigarettes, chewing tobacco, or electronic cigarettes.  If you are pregnant, do not drink alcohol.  If you are breastfeeding, limit how much and how often you drink alcohol.  Limit alcohol intake  to no more than 1 drink per day for nonpregnant women. One drink equals 12 ounces of beer, 5 ounces of wine, or 1 ounces of hard liquor.  Do not use street drugs.  Do not share needles.  Ask your health care provider for help if you need support or information about quitting drugs.  Tell your health care provider if you often feel depressed.  Tell your health care provider if you have ever been abused or do not feel safe at home. This information is not intended to replace advice given to you by your health care provider. Make sure you discuss any questions you have with your health care provider. Document Released: 10/19/2010 Document Revised: 09/11/2015 Document Reviewed: 01/07/2015 Elsevier Interactive Patient Education  [employer].

## 2018-01-10 NOTE — Progress Notes (Signed)
Subjective:   Betty Sanders is a 72 y.o. female who presents for Medicare Annual (Subsequent) preventive examination.  Review of Systems:  N/A Cardiac Risk Factors include: advanced age (>74mn, >>44women);diabetes mellitus;dyslipidemia     Objective:     Vitals: BP 122/70 (BP Location: Left Arm, Patient Position: Sitting, Cuff Size: Normal)   Pulse 62   Temp 98.4 F (36.9 C) (Oral)   Ht 5' 4.25" (1.632 m) Comment: shoes  Wt 164 lb 12 oz (74.7 kg)   SpO2 98%   BMI 28.06 kg/m   Body mass index is 28.06 kg/m.  Advanced Directives 01/10/2018 06/03/2015 05/20/2015 10/29/2013  Does Patient Have a Medical Advance Directive? No No No Patient does not have advance directive;Patient would not like information  Would patient like information on creating a medical advance directive? Yes (MAU/Ambulatory/Procedural Areas - Information given) No - patient declined information - -    Tobacco Social History   Tobacco Use  Smoking Status Former Smoker  . Last attempt to quit: 04/19/1968  . Years since quitting: 49.7  Smokeless Tobacco Never Used     Counseling given: No   Clinical Intake:  Pre-visit preparation completed: Yes  Pain : No/denies pain Pain Score: 0-No pain     Nutritional Status: BMI 25 -29 Overweight Nutritional Risks: None Diabetes: No  How often do you need to have someone help you when you read instructions, pamphlets, or other written materials from your doctor or pharmacy?: 1 - Never What is the last grade level you completed in school?: Master degree  Interpreter Needed?: No  Comments: pt is a widow and lives alone Information entered by :: LPinson, LPN  Past Medical History:  Diagnosis Date  . Bilateral hearing loss 2016   hearing aides  . [place]   . Dyslipidemia   . Heart murmur   . Hyperlipidemia   . Osteopenia 05[place]2014   [facility] . Squamous cell cancer of scalp and skin of neck 2013   s/p  excision (Dr. WJimmye Norman  . Type 2 diabetes mellitus, uncontrolled ([facility] 02/2012   DSME completed 07/2014   Past Surgical History:  Procedure Laterality Date  . BELPHAROPTOSIS REPAIR Bilateral 11/2016  . COLONOSCOPY  11/11   tubular adenoma polyp x 1; repeat 5 years--Dr. GCarlean Purl . COLONOSCOPY  05/2015   diverticulosis, rpt 10 yrs (Carlean Purl  . DENTAL SURGERY  09/2017   dental implants  . DEXA  05/2014   spine -2.1, hip -1.4  . GANGLION CYST EXCISION  2006   Left wrist  . RHINOPLASTY  1970's  . WRIST SURGERY     Family History  Problem Relation Age of Onset  . Lung cancer Father   . Colon cancer Cousin   . Breast cancer Paternal Aunt   . Cervical cancer Maternal Aunt 471      female cancer  . Diabetes Maternal Grandfather   . Stroke Paternal Grandfather   . CAD Neg Hx    Social History   Socioeconomic History  . Marital status: Widowed    Spouse name: Not on file  . Number of children: Not on file  . Years of education: Not on file  . Highest education level: Not on file  Occupational History  . 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 Financial resource strain: Not on file  . Food insecurity:    Worry: Not on file  Inability: Not on file  . Transportation needs:    Medical: Not on file    Non-medical: Not on file  Tobacco Use  . Smoking status: Former Smoker    Last attempt to quit: 04/19/1968    Years since quitting: 49.7  . Smokeless tobacco: Never Used  Substance and Sexual Activity  . Alcohol use: Yes    Alcohol/week: 0.0 standard drinks    Comment: Rare 1 glass wine per month  . Drug use: No  . Sexual activity: Not on file  Lifestyle  . Physical activity:    Days per week: Not on file    Minutes per session: Not on file  . Stress: Not on file  Relationships  . Social connections:    Talks on phone: Not on file    Gets together: Not on file    Attends religious service: Not on file    Active member of club or  organization: Not on file    Attends meetings of clubs or organizations: Not on file    Relationship status: Not on file  Other Topics Concern  . 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   Activity: kayak, restarting treadmill, wears fit bit - wants to get to 10000steps/day   Diet: My.fitness.pal 1200cal/day, good water, fruits/vegetables daily    Outpatient Encounter Medications as of 01/10/2018  Medication Sig  . aspirin (ASPIRIN EC) 81 MG EC tablet Take 81 mg by mouth daily. Reported on 06/03/2015  . Blood Glucose Monitoring Suppl (ONETOUCH VERIO IQ SYSTEM) w/Device KIT Check blood sugar twice a day and as directed. Dx E11.9  . glimepiride (AMARYL) 1 MG tablet Take 1 mg by mouth daily with breakfast.  . glucose blood (ONETOUCH VERIO) test strip Check blood sugar twice a day and as directed. Dx E11.9  . metFORMIN (GLUCOPHAGE) 500 MG tablet Take 1,000 mg by mouth daily with breakfast.   . naproxen (NAPROSYN) 500 MG tablet TAKE 1 TABLET BY MOUTH TWICE A DAY FOR 1 WEEK THEN AS NEEDED FOR PAIN, TAKE WITH FOOD  . ONETOUCH DELICA LANCETS 33G MISC Check blood sugar twice a day and as directed. Dx E11.9  . vitamin C (ASCORBIC ACID) 500 MG tablet Take 500 mg by mouth as needed.  . [DISCONTINUED] co-enzyme Q-10 30 MG capsule Take 30 mg by mouth daily.   No facility-administered encounter medications on file as of 01/10/2018.     Activities of Daily Living In your present state of health, do you have any difficulty performing the following activities: 01/10/2018  Hearing? Y  Vision? N  Difficulty concentrating or making decisions? N  Walking or climbing stairs? N  Dressing or bathing? N  Doing errands, shopping? N  Preparing Food and eating ? N  Using the Toilet? N  In the past six months, have you accidently leaked urine? N  Do you have problems with loss of bowel  control? N  Managing your Medications? N  Managing your Finances? N  Housekeeping or managing your Housekeeping? N  Some recent data might be hidden    Patient Care Team: Gutierrez, Javier, MD as PCP - General    Assessment:   This is a routine wellness examination for Betty Sanders.  Hearing Screening Comments: Bilateral hearing aids Vision Screening Comments: 2019 Vision exam with Dr. Katsoudas   Exercise Activities and Dietary recommendations Current Exercise Habits:   Home exercise routine, Type of exercise: walking, Time (Minutes): 60(6000 steps daily), Frequency (Times/Week): 7, Weekly Exercise (Minutes/Week): 420, Intensity: Moderate, Exercise limited by: None identified  Goals    . DIET - INCREASE WATER INTAKE     Starting 01/10/2018, I will continue to drink 2-3 liters of water daily.        Fall Risk Fall Risk  01/10/2018 10/29/2013  Falls in the past year? No No   Depression Screen PHQ 2/9 Scores 01/10/2018 10/29/2013  PHQ - 2 Score 1 0  PHQ- 9 Score 2 -     Cognitive Function MMSE - Mini Mental State Exam 01/10/2018  Orientation to time 5  Orientation to Place 5  Registration 3  Attention/ Calculation 0  Recall 3  Language- name 2 objects 0  Language- repeat 1  Language- follow 3 step command 3  Language- read & follow direction 0  Write a sentence 0  Copy design 0  Total score 20     PLEASE NOTE: A Mini-Cog screen was completed. Maximum score is 20. A value of 0 denotes this part of Folstein MMSE was not completed or the patient failed this part of the Mini-Cog screening.   Mini-Cog Screening Orientation to Time - Max 5 pts Orientation to Place - Max 5 pts Registration - Max 3 pts Recall - Max 3 pts Language Repeat - Max 1 pts Language Follow 3 Step Command - Max 3 pts     Immunization History  Administered Date(s) Administered  . Influenza Split 02/09/2012  . Influenza Whole 01/17/2013  . Influenza, Seasonal, Injecte, Preservative Fre 01/13/2015  .  Influenza,inj,Quad PF,6+ Mos 01/18/2014, 01/10/2018  . Influenza-Unspecified 01/18/2016, 01/26/2017  . Pneumococcal Conjugate-13 09/12/2014  . Pneumococcal Polysaccharide-23 02/14/2013  . Td 10/02/2007    Screening Tests Health Maintenance  Topic Date Due  . DTaP/Tdap/Td (1 - Tdap) 01/17/2019 (Originally 10/03/2007)  . TETANUS/TDAP  01/17/2019 (Originally 10/01/2017)  . MAMMOGRAM  01/27/2018  . OPHTHALMOLOGY EXAM  06/02/2018  . FOOT EXAM  06/03/2018  . HEMOGLOBIN A1C  07/06/2018  . URINE MICROALBUMIN  01/06/2019  . COLONOSCOPY  06/02/2025  . INFLUENZA VACCINE  Completed  . DEXA SCAN  Completed  . Hepatitis C Screening  Completed  . PNA vac Low Risk Adult  Completed      Plan:     I have personally reviewed, addressed, and noted the following in the patient's chart:  A. Medical and social history B. Use of alcohol, tobacco or illicit drugs  C. Current medications and supplements D. Functional ability and status E.  Nutritional status F.  Physical activity G. Advance directives H. List of other physicians I.  Hospitalizations, surgeries, and ER visits in previous 12 months J.  [place] to include hearing, vision, cognitive, depression L. Referrals and appointments - none  In addition, I have reviewed and discussed with patient certain preventive protocols, quality metrics, and best practice recommendations. A written personalized care plan for preventive services as well as general preventive health recommendations were provided to patient.  See attached scanned questionnaire for additional information.   Signed,   Lindell Noe, MHA, BS, LPN Health Coach

## 2018-01-10 NOTE — Assessment & Plan Note (Addendum)
Chronic, off statin. lipitor may have caused myaligas. LDL not at goal. Consider lower potency statin. The 10-year ASCVD risk score Mikey Bussing DC Brooke Bonito., et al., 2013) is: 16.4%   Values used to calculate the score:     Age: 72 years     Sex: Female     Is Non-Hispanic African American: No     Diabetic: Yes     Tobacco smoker: No     Systolic Blood Pressure: 567 mmHg     Is BP treated: No     HDL Cholesterol: 47.4 mg/dL     Total Cholesterol: 193 mg/dL

## 2018-01-10 NOTE — Assessment & Plan Note (Signed)
Advanced planning - has set up. Sister is HCPOA. New packet provided today.

## 2018-01-17 NOTE — Progress Notes (Signed)
I reviewed health advisor's note, was available for consultation, and agree with documentation and plan.  

## 2018-03-06 ENCOUNTER — Other Ambulatory Visit: Payer: Self-pay | Admitting: Family Medicine

## 2018-03-06 DIAGNOSIS — Z1231 Encounter for screening mammogram for malignant neoplasm of breast: Secondary | ICD-10-CM

## 2018-03-29 ENCOUNTER — Encounter: Payer: Self-pay | Admitting: Family Medicine

## 2018-03-29 ENCOUNTER — Ambulatory Visit
Admission: RE | Admit: 2018-03-29 | Discharge: 2018-03-29 | Disposition: A | Discharge status: Home / Self Care | Payer: PPO | Source: Ambulatory Visit | Attending: Family Medicine | Admitting: Family Medicine

## 2018-03-29 DIAGNOSIS — Z1231 Encounter for screening mammogram for malignant neoplasm of breast: Secondary | ICD-10-CM | POA: Diagnosis not present

## 2018-03-29 LAB — HM MAMMOGRAPHY

## 2018-04-05 ENCOUNTER — Encounter: Payer: Self-pay | Admitting: Family Medicine

## 2018-05-05 DIAGNOSIS — M2012 Hallux valgus (acquired), left foot: Secondary | ICD-10-CM | POA: Diagnosis not present

## 2018-05-05 DIAGNOSIS — M2011 Hallux valgus (acquired), right foot: Secondary | ICD-10-CM | POA: Diagnosis not present

## 2018-05-05 DIAGNOSIS — M2040 Other hammer toe(s) (acquired), unspecified foot: Secondary | ICD-10-CM | POA: Diagnosis not present

## 2018-05-10 ENCOUNTER — Other Ambulatory Visit: Payer: Self-pay | Admitting: Family Medicine

## 2018-05-24 DIAGNOSIS — H903 Sensorineural hearing loss, bilateral: Secondary | ICD-10-CM | POA: Diagnosis not present

## 2018-06-01 DIAGNOSIS — H903 Sensorineural hearing loss, bilateral: Secondary | ICD-10-CM | POA: Diagnosis not present

## 2018-06-21 DIAGNOSIS — J209 Acute bronchitis, unspecified: Secondary | ICD-10-CM | POA: Diagnosis not present

## 2018-06-27 ENCOUNTER — Other Ambulatory Visit: Payer: Self-pay | Admitting: Family Medicine

## 2018-09-23 ENCOUNTER — Other Ambulatory Visit: Payer: Self-pay | Admitting: Family Medicine

## 2018-10-13 DIAGNOSIS — E119 Type 2 diabetes mellitus without complications: Secondary | ICD-10-CM | POA: Diagnosis not present

## 2018-10-13 LAB — HM DIABETES EYE EXAM

## 2018-10-19 ENCOUNTER — Encounter: Payer: Self-pay | Admitting: Family Medicine

## 2019-01-04 ENCOUNTER — Encounter: Payer: Self-pay | Admitting: Family Medicine

## 2019-01-10 ENCOUNTER — Other Ambulatory Visit: Payer: Self-pay | Admitting: Family Medicine

## 2019-01-10 ENCOUNTER — Other Ambulatory Visit (INDEPENDENT_AMBULATORY_CARE_PROVIDER_SITE_OTHER): Payer: PPO

## 2019-01-10 ENCOUNTER — Other Ambulatory Visit: Payer: Self-pay

## 2019-01-10 DIAGNOSIS — E118 Type 2 diabetes mellitus with unspecified complications: Secondary | ICD-10-CM

## 2019-01-10 DIAGNOSIS — E785 Hyperlipidemia, unspecified: Secondary | ICD-10-CM | POA: Diagnosis not present

## 2019-01-10 DIAGNOSIS — K760 Fatty (change of) liver, not elsewhere classified: Secondary | ICD-10-CM

## 2019-01-10 LAB — COMPREHENSIVE METABOLIC PANEL
ALT: 13 U/L (ref 0–35)
AST: 16 U/L (ref 0–37)
Albumin: 4.1 g/dL (ref 3.5–5.2)
Alkaline Phosphatase: 80 U/L (ref 39–117)
BUN: 10 mg/dL (ref 6–23)
CO2: 28 mEq/L (ref 19–32)
Calcium: 9.5 mg/dL (ref 8.4–10.5)
Chloride: 100 mEq/L (ref 96–112)
Creatinine, Ser: 0.63 mg/dL (ref 0.40–1.20)
GFR: 93.14 mL/min (ref >60.00)
Glucose, Bld: 172 mg/dL — ABNORMAL HIGH (ref 70–99)
Potassium: 4.2 mEq/L (ref 3.5–5.1)
Sodium: 136 mEq/L (ref 135–145)
Total Bilirubin: 0.5 mg/dL (ref 0.2–1.2)
Total Protein: 6.9 g/dL (ref 6.0–8.3)

## 2019-01-10 LAB — HEMOGLOBIN A1C: Hgb A1c MFr Bld: 7.1 % — ABNORMAL HIGH (ref 4.6–6.5)

## 2019-01-10 LAB — MICROALBUMIN / CREATININE URINE RATIO
Creatinine,U: 62.3 mg/dL
Microalb Creat Ratio: 1.1 mg/g (ref 0.0–30.0)
Microalb, Ur: <0.7 mg/dL (ref 0.0–1.9)

## 2019-01-10 LAB — LIPID PANEL
Cholesterol: 187 mg/dL (ref 0–200)
HDL: 42.9 mg/dL (ref >39.00)
LDL Cholesterol: 111 mg/dL — ABNORMAL HIGH (ref 0–99)
NonHDL: 144.04
Total CHOL/HDL Ratio: 4
Triglycerides: 165 mg/dL — ABNORMAL HIGH (ref 0.0–149.0)
VLDL: 33 mg/dL (ref 0.0–40.0)

## 2019-01-10 NOTE — Addendum Note (Signed)
Addended by: Cloyd Stagers on: 01/10/2019 08:21 AM   Modules accepted: Orders

## 2019-01-17 ENCOUNTER — Ambulatory Visit: Payer: PPO

## 2019-01-17 ENCOUNTER — Other Ambulatory Visit: Payer: Self-pay

## 2019-01-17 ENCOUNTER — Ambulatory Visit (INDEPENDENT_AMBULATORY_CARE_PROVIDER_SITE_OTHER): Payer: PPO | Admitting: Family Medicine

## 2019-01-17 ENCOUNTER — Encounter: Payer: Self-pay | Admitting: Family Medicine

## 2019-01-17 VITALS — BP 122/66 | HR 65 | Temp 97.9°F | Ht 63.5 in | Wt 178.1 lb

## 2019-01-17 DIAGNOSIS — Z0000 Encounter for general adult medical examination without abnormal findings: Secondary | ICD-10-CM | POA: Diagnosis not present

## 2019-01-17 DIAGNOSIS — Z23 Encounter for immunization: Secondary | ICD-10-CM

## 2019-01-17 DIAGNOSIS — M8580 Other specified disorders of bone density and structure, unspecified site: Secondary | ICD-10-CM

## 2019-01-17 DIAGNOSIS — E118 Type 2 diabetes mellitus with unspecified complications: Secondary | ICD-10-CM

## 2019-01-17 DIAGNOSIS — E669 Obesity, unspecified: Secondary | ICD-10-CM

## 2019-01-17 DIAGNOSIS — R011 Cardiac murmur, unspecified: Secondary | ICD-10-CM

## 2019-01-17 DIAGNOSIS — E785 Hyperlipidemia, unspecified: Secondary | ICD-10-CM

## 2019-01-17 DIAGNOSIS — Z7189 Other specified counseling: Secondary | ICD-10-CM

## 2019-01-17 DIAGNOSIS — K760 Fatty (change of) liver, not elsewhere classified: Secondary | ICD-10-CM

## 2019-01-17 DIAGNOSIS — E66811 Obesity, class 1: Secondary | ICD-10-CM

## 2019-01-17 MED ORDER — METFORMIN HCL 500 MG PO TABS: 1000.0000 mg | ORAL_TABLET | Freq: Every day | ORAL | 3 refills | Status: DC

## 2019-01-17 MED ORDER — ASPIRIN 81 MG PO TBEC: 81.0000 mg | DELAYED_RELEASE_TABLET | ORAL | Status: DC

## 2019-01-17 MED ORDER — GLIMEPIRIDE 1 MG PO TABS: 1.0000 mg | ORAL_TABLET | Freq: Every day | ORAL | 3 refills | Status: DC

## 2019-01-17 MED ORDER — ROSUVASTATIN CALCIUM 5 MG PO TABS: 5.0000 mg | ORAL_TABLET | ORAL | 3 refills | Status: DC

## 2019-01-17 NOTE — Assessment & Plan Note (Signed)
Preventative protocols reviewed and updated unless pt declined. Discussed healthy diet and lifestyle.  

## 2019-01-17 NOTE — Assessment & Plan Note (Signed)
Advanced planning - has set up at home. Sister is HCPOA.asked to bring us copy.  

## 2019-01-17 NOTE — Assessment & Plan Note (Signed)
Marked systolic murmur with radiation to carotids. Will check baseline echo.

## 2019-01-17 NOTE — Patient Instructions (Addendum)
Flu shot today If interested, check with pharmacy about new 2 shot shingles series (shingrix).  We will schedule you for echocardiogram for further assessment of systolic murmur.  Trial crestor 5mg  weekly for 1 month, if tolerated increase to twice weekly and continue taper as tolerated.  Restart aspirin a few times a week.  Return as needed or in 6 months for diabetes follow up visit.   Health Maintenance After Age 71 After age 2, you are at a higher risk for certain long-term diseases and infections as well as injuries from falls. Falls are a major cause of broken bones and head injuries in people who are older than age 67. Getting regular preventive care can help to keep you healthy and well. Preventive care includes getting regular testing and making lifestyle changes as recommended by your health care provider. Talk with your health care provider about:  Which screenings and tests you should have. A screening is a test that checks for a disease when you have no symptoms.  A diet and exercise plan that is right for you. What should I know about screenings and tests to prevent falls? Screening and testing are the best ways to find a health problem early. Early diagnosis and treatment give you the best chance of managing medical conditions that are common after age 23. Certain conditions and lifestyle choices may make you more likely to have a fall. Your health care provider may recommend:  Regular vision checks. Poor vision and conditions such as cataracts can make you more likely to have a fall. If you wear glasses, make sure to get your prescription updated if your vision changes.  Medicine review. Work with your health care provider to regularly review all of the medicines you are taking, including over-the-counter medicines. Ask your health care provider about any side effects that may make you more likely to have a fall. Tell your health care provider if any medicines that you take make  you feel dizzy or sleepy.  Osteoporosis screening. Osteoporosis is a condition that causes the bones to get weaker. This can make the bones weak and cause them to break more easily.  Blood pressure screening. Blood pressure changes and medicines to control blood pressure can make you feel dizzy.  Strength and balance checks. Your health care provider may recommend certain tests to check your strength and balance while standing, walking, or changing positions.  Foot health exam. Foot pain and numbness, as well as not wearing proper footwear, can make you more likely to have a fall.  Depression screening. You may be more likely to have a fall if you have a fear of falling, feel emotionally low, or feel unable to do activities that you used to do.  Alcohol use screening. Using too much alcohol can affect your balance and may make you more likely to have a fall. What actions can I take to lower my risk of falls? General instructions  Talk with your health care provider about your risks for falling. Tell your health care provider if: ? You fall. Be sure to tell your health care provider about all falls, even ones that seem minor. ? You feel dizzy, sleepy, or off-balance.  Take over-the-counter and prescription medicines only as told by your health care provider. These include any supplements.  Eat a healthy diet and maintain a healthy weight. A healthy diet includes low-fat dairy products, low-fat (lean) meats, and fiber from whole grains, beans, and lots of fruits and vegetables. Home safety  Remove any tripping hazards, such as rugs, cords, and clutter.  Install safety equipment such as grab bars in bathrooms and safety rails on stairs.  Keep rooms and walkways well-lit. Activity   Follow a regular exercise program to stay fit. This will help you maintain your balance. Ask your health care provider what types of exercise are appropriate for you.  If you need a cane or walker, use it  as recommended by your health care provider.  Wear supportive shoes that have nonskid soles. Lifestyle  Do not drink alcohol if your health care provider tells you not to drink.  If you drink alcohol, limit how much you have: ? 0-1 drink a day for women. ? 0-2 drinks a day for men.  Be aware of how much alcohol is in your drink. In the [place] one drink equals one typical bottle of beer (12 oz), one-half glass of wine (5 oz), or one shot of hard liquor (1 oz).  Do not use any products that contain nicotine or tobacco, such as cigarettes and e-cigarettes. If you need help quitting, ask your health care provider. Summary  Having a healthy lifestyle and getting preventive care can help to protect your health and wellness after age 54.  Screening and testing are the best way to find a health problem early and help you avoid having a fall. Early diagnosis and treatment give you the best chance for managing medical conditions that are more common for people who are older than age 76.  Falls are a major cause of broken bones and head injuries in people who are older than age 25. Take precautions to prevent a fall at home.  Work with your health care provider to learn what changes you can make to improve your health and wellness and to prevent falls. This information is not intended to replace advice given to you by your health care provider. Make sure you discuss any questions you have with your health care provider. Document Released: 02/16/2017 Document Revised: 07/27/2018 Document Reviewed: 02/16/2017 Elsevier Patient Education  2020 [employer].

## 2019-01-17 NOTE — Assessment & Plan Note (Signed)
Reviewed weight gain noted. Encouraged healthy diet and lifestyle changes to affect sustainable weight loss.  

## 2019-01-17 NOTE — Assessment & Plan Note (Signed)
Update DEXA next year.

## 2019-01-17 NOTE — Assessment & Plan Note (Signed)
Chronic, LDL not at goal in diabetic. H/o statin intolerance. Will trial low dose crestor weekly.  The 10-year ASCVD risk score Betty Sanders Brooke Bonito., et al., 2013) is: 18.2%   Values used to calculate the score:     Age: 73 years     Sex: Female     Is Non-Hispanic African American: No     Diabetic: Yes     Tobacco smoker: No     Systolic Blood Pressure: 123XX123 mmHg     Is BP treated: No     HDL Cholesterol: 42.9 mg/dL     Total Cholesterol: 187 mg/dL

## 2019-01-17 NOTE — Progress Notes (Signed)
This visit was conducted in person.  BP 122/66 (BP Location: Left Arm, Patient Position: Sitting, Cuff Size: Normal)   Pulse 65   Temp 97.9 F (36.6 C) (Temporal)   Ht 5' 3.5" (1.613 m)   Wt 178 lb 1 oz (80.8 kg)   SpO2 98%   BMI 31.05 kg/m    CC: AMW/CPE Subjective:    Patient ID: Betty Sanders, female    DOB: 11-24-1945, 73 y.o.   MRN: [id number]  HPI: Betty Sanders is a 73 y.o. female presenting on 01/17/2019 for Medicare Wellness   Husband passed away October 17, 2016 - amyloidosis complicating open heart surgery.  Continues to enjoy camping in her refurbished [place].  Dog died 11-17-2018.  Continues to enjoy exercising. Notices more trouble sleeping recently - attributed to stressors.    Did not see health advisor this year.    Hearing Screening   '125Hz'  '250Hz'  '500Hz'  '1000Hz'  '2000Hz'  '3000Hz'  '4000Hz'  '6000Hz'  '8000Hz'   Right ear:           Left ear:           Comments: Wears bilateral hearing aids.  Wearing at today's visit  Vision Screening Comments: Last eye exam, October 18, 2018    Office Visit from 01/17/2019 in [place] at [facility] Total Score  0      Fall Risk  01/17/2019 01/10/2018 10/29/2013  Falls in the past year? 0 No No      DM - does regularly check sugars - some elevated readings recently. Compliant with antihyperglycemic regimen which includes: amaryl 56m daily, metformin 10051mbid. Denies low sugars or hypoglycemic symptoms. Denies paresthesias. Last diabetic eye exam 09/24/99/2020Pneumovax: 2014. Prevnar: 2016. Glucometer brand: one-touch verio. DSME: has not done. Lab Results  Component Value Date   HGBA1C 7.1 (H) 01/10/2019   Diabetic Foot Exam - Simple   Simple Foot Form Diabetic Foot exam was performed with the following findings: Yes 01/17/2019 10:57 AM  Visual Inspection See comments: Yes Sensation Testing Intact to touch and monofilament testing bilaterally: Yes Pulse Check Posterior Tibialis and Dorsalis pulse intact bilaterally: Yes Comments  Calluses at soles bliaterally    Lab Results  Component Value Date   MICROALBUR <0.7 01/10/2019     Preventative: COLONOSCOPY Date: 05/2015 diverticulosis, rpt 10 yrs (GCarlean PurlMammogram 03/2018, Birads 1. Well woman exam - always normal.last 7/07-31-18Fmhx (aunt) GYN cancer, unsure type. Would like to space out Q3 yrs.  DEXA - osteopenia 05/2014. Likes yogurt and ice cream. Weight bearing exercise reviewed.  Flu shot yearly Td 2009  Pneumovax 01/2013, prevnar 2016 Shingrix - discussed  Advanced planning - has set up at home. Sister is HCPOA. asked to bring [place].  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 Interim medical history since our last visit reviewed. Allergies and medications reviewed and updated. Outpatient Medications Prior to Visit  Medication Sig Dispense Refill  . BIOTIN PO Take by mouth daily.    . Blood Glucose Monitoring Suppl (ONETOUCH VERIO  IQ SYSTEM) w/Device KIT Check blood sugar twice a day and as directed. Dx E11.9 1 kit 0  . ONETOUCH DELICA LANCETS 10G MISC Check blood sugar twice a day and as directed. Dx E11.9 100 each 6  . ONETOUCH VERIO test strip CHECK BLOOD SUGAR TWICE A DAY AND AS DIRECTED. DX E11.9 100 each 2  . Probiotic Product (PROBIOTIC PO) Take by mouth daily.    . vitamin C (ASCORBIC ACID) 500 MG tablet Take 500 mg by mouth as needed.    Marland Kitchen glimepiride (AMARYL) 1 MG tablet Take 1 tablet (1 mg total) by mouth daily  with breakfast. 90 tablet 3  . metFORMIN (GLUCOPHAGE) 500 MG tablet Take 2 tablets (1,000 mg total) by mouth daily with breakfast. 180 tablet 3  . aspirin (ASPIRIN EC) 81 MG EC tablet Take 81 mg by mouth daily. Reported on 06/03/2015    . naproxen (NAPROSYN) 500 MG tablet TAKE 1 TABLET BY MOUTH TWICE A DAY FOR 1 WEEK THEN AS NEEDED FOR PAIN, TAKE WITH FOOD 50 tablet 0   No facility-administered medications prior to visit.      Per HPI unless specifically indicated in ROS section below Review of Systems  Constitutional: Negative for activity change, appetite change, chills, fatigue, fever and unexpected weight change.  HENT: Positive for congestion and rhinorrhea. Negative for hearing loss.   Eyes: Negative for visual disturbance.  Respiratory: Negative for cough, chest tightness, shortness of breath and wheezing.   Cardiovascular: Negative for chest pain, palpitations and leg swelling.  Gastrointestinal: Positive for abdominal pain (increased indigestion due to carbs). Negative for abdominal distention, blood in stool, constipation, diarrhea, nausea and vomiting.  Genitourinary: Negative for difficulty urinating and hematuria.  Musculoskeletal: Negative for arthralgias, myalgias and neck pain.  Skin: Negative for rash.  Neurological: Positive for headaches (occasional sinus pressure). Negative for dizziness, seizures and syncope.  Hematological: Negative for adenopathy. Does not bruise/bleed easily.  Psychiatric/Behavioral: Negative for dysphoric mood. The patient is not nervous/anxious.    Objective:    BP 122/66 (BP Location: Left Arm, Patient Position: Sitting, Cuff Size: Normal)   Pulse 65   Temp 97.9 F (36.6 C) (Temporal)   Ht 5' 3.5" (1.613 m)   Wt 178 lb 1 oz (80.8 kg)   SpO2 98%   BMI 31.05 kg/m   Wt Readings from Last 3 Encounters:  01/17/19 178 lb 1 oz (80.8 kg)  01/10/18 164 lb 12 oz (74.7 kg)  01/10/18 164 lb 12 oz (74.7 kg)    Physical Exam Vitals signs and  nursing note reviewed.  Constitutional:      General: She is not in acute distress.    Appearance: Normal appearance. She is well-developed. She is not ill-appearing.  HENT:     Head: Normocephalic and atraumatic.     Right Ear: Hearing, tympanic membrane, ear canal and external ear normal.     Left Ear: Hearing, tympanic membrane, ear canal and external ear normal.     Nose: Nose normal.     Mouth/Throat:     Mouth: Mucous membranes are moist.     Pharynx: Oropharynx is clear. Uvula midline. No posterior oropharyngeal erythema.  Eyes:     General: No scleral icterus.    Extraocular Movements: Extraocular movements intact.     Conjunctiva/sclera: Conjunctivae normal.     Pupils: Pupils are equal, round, and reactive to light.  Neck:     Musculoskeletal: Normal range of motion and neck supple.     Vascular:  Carotid bruit (bilat ?referred from murmur) present.  Cardiovascular:     Rate and Rhythm: Normal rate and regular rhythm.     Pulses: Normal pulses.          Radial pulses are 2+ on the right side and 2+ on the left side.     Heart sounds: Murmur (3/6 systolic throughout) present.  Pulmonary:     Effort: Pulmonary effort is normal. No respiratory distress.     Breath sounds: Normal breath sounds. No wheezing, rhonchi or rales.  Abdominal:     General: Abdomen is flat. Bowel sounds are normal. There is no distension.     Palpations: Abdomen is soft. There is no mass.     Tenderness: There is no abdominal tenderness. There is no guarding or rebound.     Hernia: No hernia is present.  Musculoskeletal: Normal range of motion.     Right lower leg: No edema.     Left lower leg: No edema.  Lymphadenopathy:     Cervical: No cervical adenopathy.  Skin:    General: Skin is warm and dry.     Findings: No rash.  Neurological:     General: No focal deficit present.     Mental Status: She is alert and oriented to person, place, and time.     Comments:  CN grossly intact, station and  gait intact Recall 3/3 Calculation 5/5 [id number]  Psychiatric:        Mood and Affect: Mood normal.        Behavior: Behavior normal.        Thought Content: Thought content normal.        Judgment: Judgment normal.       Results for orders placed or performed in visit on 01/10/19  Hemoglobin A1c  Result Value Ref Range   Hgb A1c MFr Bld 7.1 (H) 4.6 - 6.5 %  Comprehensive metabolic panel  Result Value Ref Range   Sodium 136 135 - 145 mEq/L   Potassium 4.2 3.5 - 5.1 mEq/L   Chloride 100 96 - 112 mEq/L   CO2 28 19 - 32 mEq/L   Glucose, Bld 172 (H) 70 - 99 mg/dL   BUN 10 6 - 23 mg/dL   Creatinine, Ser 0.63 0.40 - 1.20 mg/dL   Total Bilirubin 0.5 0.2 - 1.2 mg/dL   Alkaline Phosphatase 80 39 - 117 U/L   AST 16 0 - 37 U/L   ALT 13 0 - 35 U/L   Total Protein 6.9 6.0 - 8.3 g/dL   Albumin 4.1 3.5 - 5.2 g/dL   Calcium 9.5 8.4 - 10.5 mg/dL   GFR 93.14 >60.00 mL/min  Lipid panel  Result Value Ref Range   Cholesterol 187 0 - 200 mg/dL   Triglycerides 165.0 (H) 0.0 - 149.0 mg/dL   HDL 42.90 >39.00 mg/dL   VLDL 33.0 0.0 - 40.0 mg/dL   LDL Cholesterol 111 (H) 0 - 99 mg/dL   Total CHOL/HDL Ratio 4    NonHDL 144.04   Microalbumin / creatinine urine ratio  Result Value Ref Range   Microalb, Ur <0.7 0.0 - 1.9 mg/dL   Creatinine,U 62.3 mg/dL   Microalb Creat Ratio 1.1 0.0 - 30.0 mg/g   Assessment & Plan:   Problem List Items Addressed This Visit    Systolic murmur    Marked systolic murmur with radiation to carotids. Will check baseline echo.       Relevant Orders   ECHOCARDIOGRAM COMPLETE  Osteopenia    Update DEXA next year.       Obesity, Class I, BMI 30-34.9    Reviewed weight gain noted. Encouraged healthy diet and lifestyle changes to affect sustainable weight loss       NAFLD (nonalcoholic fatty liver disease)   Medicare annual wellness visit, subsequent - Primary    I have personally reviewed the Medicare Annual Wellness questionnaire and have noted 1. The  patient's medical and social history 2. Their use of alcohol, tobacco or illicit drugs 3. Their current medications and supplements 4. The patient's functional ability including ADL's, fall risks, home safety risks and hearing or visual impairment. Cognitive function has been assessed and addressed as indicated.  5. Diet and physical activity 6. Evidence for depression or mood disorders The patients weight, height, BMI have been recorded in the chart. I have made referrals, counseling and provided education to the patient based on review of the above and I have provided the pt with a written personalized care plan for preventive services. Provider list updated.. See scanned questionairre as needed for further documentation. Reviewed preventative protocols and updated unless pt declined.       Healthcare maintenance    Preventative protocols reviewed and updated unless pt declined. Discussed healthy diet and lifestyle.       Dyslipidemia    Chronic, LDL not at goal in diabetic. H/o statin intolerance. Will trial low dose crestor weekly.  The 10-year ASCVD risk score Mikey Bussing DC Brooke Bonito., et al., 2013) is: 18.2%   Values used to calculate the score:     Age: 26 years     Sex: Female     Is Non-Hispanic African American: No     Diabetic: Yes     Tobacco smoker: No     Systolic Blood Pressure: 161 mmHg     Is BP treated: No     HDL Cholesterol: 42.9 mg/dL     Total Cholesterol: 187 mg/dL       Relevant Medications   rosuvastatin (CRESTOR) 5 MG tablet   Controlled diabetes mellitus type 2 with complications (HCC)    Chronic, deteriorated control based on latest A1c but still adequate. Pt motivated to renew efforts at following diabetic diet. Foot exam today.       Relevant Medications   glimepiride (AMARYL) 1 MG tablet   metFORMIN (GLUCOPHAGE) 500 MG tablet   aspirin (ASPIRIN EC) 81 MG EC tablet   rosuvastatin (CRESTOR) 5 MG tablet   Advanced care planning/counseling discussion     Advanced planning - has set up at home. Sister is HCPOA. asked to bring [place] copy.        Other Visit Diagnoses    Need for influenza vaccination       Relevant Orders   Flu Vaccine QUAD High Dose(Fluad) (Completed)       Meds ordered this encounter  Medications  . glimepiride (AMARYL) 1 MG tablet    Sig: Take 1 tablet (1 mg total) by mouth daily with breakfast.    Dispense:  90 tablet    Refill:  3  . metFORMIN (GLUCOPHAGE) 500 MG tablet    Sig: Take 2 tablets (1,000 mg total) by mouth daily with breakfast.    Dispense:  180 tablet    Refill:  3  . aspirin (ASPIRIN EC) 81 MG EC tablet    Sig: Take 1 tablet (81 mg total) by mouth every Monday, Wednesday, and Friday. Reported on 06/03/2015  . rosuvastatin (CRESTOR) 5 MG  tablet    Sig: Take 1 tablet (5 mg total) by mouth every Monday, Wednesday, and Friday.    Dispense:  40 tablet    Refill:  3   Orders Placed This Encounter  Procedures  . Flu Vaccine QUAD High Dose(Fluad)  . ECHOCARDIOGRAM COMPLETE    Standing Status:   Future    Standing Expiration Date:   04/17/2020    Order Specific Question:   Where should this test be performed    Answer:   [facility]    Order Specific Question:   Perflutren DEFINITY (image enhancing agent) should be administered unless hypersensitivity or allergy exist    Answer:   Administer Perflutren    Order Specific Question:   Reason for exam-Echo    Answer:   Murmur  785.2 / R01.1    Patient instructions: Flu shot today If interested, check with pharmacy about new 2 shot shingles series (shingrix).  We will schedule you for echocardiogram for further assessment of systolic murmur.  Trial crestor 40m weekly for 1 month, if tolerated increase to twice weekly and continue taper as tolerated.  Restart aspirin a few times a week.  Return as needed or in 6 months for diabetes follow up visit.   Follow up plan: Return in about 6 months (around 07/17/2019) for follow up visit.  JRia Bush MD

## 2019-01-17 NOTE — Assessment & Plan Note (Addendum)
Chronic, deteriorated control based on latest A1c but still adequate. Pt motivated to renew efforts at following diabetic diet. Foot exam today.

## 2019-01-17 NOTE — Assessment & Plan Note (Signed)

## 2019-02-13 ENCOUNTER — Other Ambulatory Visit: Payer: Self-pay | Admitting: Family Medicine

## 2019-02-13 DIAGNOSIS — Z1231 Encounter for screening mammogram for malignant neoplasm of breast: Secondary | ICD-10-CM

## 2019-02-19 ENCOUNTER — Other Ambulatory Visit: Payer: Self-pay | Admitting: Family Medicine

## 2019-03-06 ENCOUNTER — Other Ambulatory Visit: Payer: Self-pay

## 2019-03-06 ENCOUNTER — Ambulatory Visit (INDEPENDENT_AMBULATORY_CARE_PROVIDER_SITE_OTHER): Payer: PPO

## 2019-03-06 DIAGNOSIS — R011 Cardiac murmur, unspecified: Secondary | ICD-10-CM | POA: Diagnosis not present

## 2019-03-08 ENCOUNTER — Encounter: Payer: Self-pay | Admitting: Family Medicine

## 2019-04-02 ENCOUNTER — Ambulatory Visit
Admission: RE | Admit: 2019-04-02 | Discharge: 2019-04-02 | Disposition: A | Discharge status: Home / Self Care | Payer: PPO | Source: Ambulatory Visit | Attending: Family Medicine | Admitting: Family Medicine

## 2019-04-02 DIAGNOSIS — Z1231 Encounter for screening mammogram for malignant neoplasm of breast: Secondary | ICD-10-CM | POA: Diagnosis not present

## 2019-04-02 LAB — HM MAMMOGRAPHY

## 2019-04-04 ENCOUNTER — Encounter: Payer: Self-pay | Admitting: Family Medicine

## 2019-05-19 ENCOUNTER — Ambulatory Visit: Payer: PPO

## 2019-05-26 ENCOUNTER — Ambulatory Visit: Payer: PPO | Attending: Internal Medicine

## 2019-05-26 DIAGNOSIS — Z23 Encounter for immunization: Secondary | ICD-10-CM | POA: Insufficient documentation

## 2019-05-26 NOTE — Progress Notes (Signed)
   Covid-19 Vaccination Clinic  Name:  Betty Sanders    MRN: [id number] DOB: 06-09-1945  05/26/2019  [name] was observed post Covid-19 immunization for 15 minutes without incidence. She was provided with Vaccine Information Sheet and instruction to access the V-Safe system.   [name] was instructed to call 911 with any severe reactions post vaccine: Marland Kitchen Difficulty breathing  . Swelling of your face and throat  . A fast heartbeat  . A bad rash all over your body  . Dizziness and weakness    Immunizations Administered    Name Date Dose VIS Date Route   Pfizer COVID-19 Vaccine 05/26/2019  4:49 PM 0.3 mL 03/30/2019 Intramuscular   Manufacturer: [place]   Lot: [id number]   [place]: [id number]

## 2019-06-09 ENCOUNTER — Ambulatory Visit: Payer: PPO

## 2019-06-20 ENCOUNTER — Ambulatory Visit: Payer: PPO | Attending: Internal Medicine

## 2019-06-20 DIAGNOSIS — Z23 Encounter for immunization: Secondary | ICD-10-CM | POA: Insufficient documentation

## 2019-06-20 NOTE — Progress Notes (Signed)
   Covid-19 Vaccination Clinic  Name:  TOSHA CANNADAY    MRN: [id number] DOB: [birth year]  06/20/2019  Ms. Goddard was observed post Covid-19 immunization for 15 minutes without incident. She was provided with Vaccine Information Sheet and instruction to access the V-Safe system.   Ms. Storz was instructed to call 911 with any severe reactions post vaccine: Marland Kitchen Difficulty breathing  . Swelling of face and throat  . A fast heartbeat  . A bad rash all over body  . Dizziness and weakness   Immunizations Administered    Name Date Dose VIS Date Route   Pfizer COVID-19 Vaccine 06/20/2019  1:14 PM 0.3 mL 03/30/2019 Intramuscular   Manufacturer: [place]   Lot: [id number]   [place]: [id number]

## 2019-07-10 ENCOUNTER — Other Ambulatory Visit: Payer: Self-pay | Admitting: Family Medicine

## 2019-07-10 DIAGNOSIS — E785 Hyperlipidemia, unspecified: Secondary | ICD-10-CM

## 2019-07-10 DIAGNOSIS — E118 Type 2 diabetes mellitus with unspecified complications: Secondary | ICD-10-CM

## 2019-07-11 ENCOUNTER — Other Ambulatory Visit: Payer: Self-pay

## 2019-07-11 ENCOUNTER — Other Ambulatory Visit (INDEPENDENT_AMBULATORY_CARE_PROVIDER_SITE_OTHER): Payer: PPO

## 2019-07-11 DIAGNOSIS — E118 Type 2 diabetes mellitus with unspecified complications: Secondary | ICD-10-CM

## 2019-07-11 DIAGNOSIS — E785 Hyperlipidemia, unspecified: Secondary | ICD-10-CM | POA: Diagnosis not present

## 2019-07-11 LAB — COMPREHENSIVE METABOLIC PANEL
ALT: 12 U/L (ref 0–35)
AST: 15 U/L (ref 0–37)
Albumin: 4.4 g/dL (ref 3.5–5.2)
Alkaline Phosphatase: 94 U/L (ref 39–117)
BUN: 14 mg/dL (ref 6–23)
CO2: 28 mEq/L (ref 19–32)
Calcium: 9.7 mg/dL (ref 8.4–10.5)
Chloride: 100 mEq/L (ref 96–112)
Creatinine, Ser: 0.7 mg/dL (ref 0.40–1.20)
GFR: 82.36 mL/min (ref >60.00)
Glucose, Bld: 167 mg/dL — ABNORMAL HIGH (ref 70–99)
Potassium: 4.3 mEq/L (ref 3.5–5.1)
Sodium: 135 mEq/L (ref 135–145)
Total Bilirubin: 0.5 mg/dL (ref 0.2–1.2)
Total Protein: 7.6 g/dL (ref 6.0–8.3)

## 2019-07-11 LAB — LIPID PANEL
Cholesterol: 208 mg/dL — ABNORMAL HIGH (ref 0–200)
HDL: 46.6 mg/dL (ref >39.00)
LDL Cholesterol: 137 mg/dL — ABNORMAL HIGH (ref 0–99)
NonHDL: 161.24
Total CHOL/HDL Ratio: 4
Triglycerides: 120 mg/dL (ref 0.0–149.0)
VLDL: 24 mg/dL (ref 0.0–40.0)

## 2019-07-11 LAB — HEMOGLOBIN A1C: Hgb A1c MFr Bld: 6.4 % (ref 4.6–6.5)

## 2019-07-13 ENCOUNTER — Ambulatory Visit (INDEPENDENT_AMBULATORY_CARE_PROVIDER_SITE_OTHER): Payer: PPO | Admitting: Family Medicine

## 2019-07-13 ENCOUNTER — Other Ambulatory Visit: Payer: Self-pay

## 2019-07-13 ENCOUNTER — Encounter: Payer: Self-pay | Admitting: Family Medicine

## 2019-07-13 VITALS — BP 112/64 | HR 66 | Temp 97.8°F | Ht 63.5 in | Wt 174.2 lb

## 2019-07-13 DIAGNOSIS — E785 Hyperlipidemia, unspecified: Secondary | ICD-10-CM

## 2019-07-13 DIAGNOSIS — E118 Type 2 diabetes mellitus with unspecified complications: Secondary | ICD-10-CM | POA: Diagnosis not present

## 2019-07-13 DIAGNOSIS — E1169 Type 2 diabetes mellitus with other specified complication: Secondary | ICD-10-CM

## 2019-07-13 MED ORDER — CLOBETASOL PROPIONATE 0.05 % EX FOAM: Freq: Two times a day (BID) | CUTANEOUS | 0 refills | Status: DC

## 2019-07-13 MED ORDER — ROSUVASTATIN CALCIUM 5 MG PO TABS: 5.0000 mg | ORAL_TABLET | ORAL | 3 refills | Status: DC

## 2019-07-13 NOTE — Progress Notes (Signed)
This visit was conducted in person.  BP 112/64 (BP Location: Left Arm, Patient Position: Sitting, Cuff Size: Normal)   Pulse 66   Temp 97.8 F (36.6 C) (Temporal)   Ht 5' 3.5" (1.613 m)   Wt 174 lb 3 oz (79 kg)   SpO2 98%   BMI 30.37 kg/m    CC: 6 mo f/u visit  Subjective:    Patient ID: Betty Sanders, female    DOB: [birth year], 74 y.o.   MRN: [id number]  HPI: Betty Sanders is a 74 y.o. female presenting on 07/13/2019 for Follow-up (Here for 6 mo f/u.)   Completed pfizer shots.   DM - does regularly check sugars - always high first thing in the morning 150-160, better the rest of the day 120s. Compliant with antihyperglycemic regimen which includes: amaryl 21m daily, metformin 10058mdaily with breakfast. She has done the best she can over the past 3 months with significant diet changes. Id whole 30 plan in January. Few low sugars, hypoglycemic symptoms. Attributes to amaryl. Denies paresthesias. Last diabetic eye exam 09/2018. Pneumovax: 2014. Prevnar: 2016. Glucometer brand: one touch. DSME: . Lab Results  Component Value Date   HGBA1C 6.4 07/11/2019   Diabetic Foot Exam - Simple   Simple Foot Form Diabetic Foot exam was performed with the following findings: Yes 07/13/2019  9:53 AM  Visual Inspection See comments: Yes Sensation Testing Intact to touch and monofilament testing bilaterally: Yes Pulse Check Posterior Tibialis and Dorsalis pulse intact bilaterally: Yes Comments R sole with dry skin rash - requests refill of clobetasol 0.05% foam previously prescribed for this     Lab Results  Component Value Date   MICROALBUR <0.7 01/10/2019     Hyperlipidemia - she took crestor for 3 wks then stopped due to myalgias. Trouble tolerating statins in the past. She was taking MWF.      Relevant past medical, surgical, family and social history reviewed and updated as indicated. Interim medical history since our last visit reviewed. Allergies and medications  reviewed and updated. Outpatient Medications Prior to Visit  Medication Sig Dispense Refill  . aspirin (ASPIRIN EC) 81 MG EC tablet Take 1 tablet (81 mg total) by mouth every Monday, Wednesday, and Friday. Reported on 06/03/2015    . BIOTIN PO Take by mouth daily.    . Blood Glucose Monitoring Suppl (ONETOUCH VERIO IQ SYSTEM) w/Device KIT Check blood sugar twice a day and as directed. Dx E11.9 1 kit 0  . glimepiride (AMARYL) 1 MG tablet Take 1 tablet (1 mg total) by mouth daily with breakfast. 90 tablet 3  . metFORMIN (GLUCOPHAGE) 500 MG tablet Take 2 tablets (1,000 mg total) by mouth daily with breakfast. 180 tablet 3  . ONETOUCH DELICA LANCETS 3362UISC Check blood sugar twice a day and as directed. Dx E11.9 100 each 6  . ONETOUCH VERIO test strip CHECK BLOOD SUGAR TWICE A DAY AND AS DIRECTED. DX E11.9 100 strip 2  . Probiotic Product (PROBIOTIC PO) Take by mouth daily.    . vitamin C (ASCORBIC ACID) 500 MG tablet Take 500 mg by mouth as needed.    . rosuvastatin (CRESTOR) 5 MG tablet Take 1 tablet (5 mg total) by mouth every Monday, Wednesday, and Friday. 40 tablet 3   No facility-administered medications prior to visit.     Per HPI unless specifically indicated in ROS section below Review of Systems Objective:    BP 112/64 (BP Location: Left Arm, Patient Position: Sitting, Cuff  Size: Normal)   Pulse 66   Temp 97.8 F (36.6 C) (Temporal)   Ht 5' 3.5" (1.613 m)   Wt 174 lb 3 oz (79 kg)   SpO2 98%   BMI 30.37 kg/m   Wt Readings from Last 3 Encounters:  07/13/19 174 lb 3 oz (79 kg)  01/17/19 178 lb 1 oz (80.8 kg)  01/10/18 164 lb 12 oz (74.7 kg)    Physical Exam Vitals and nursing note reviewed.  Constitutional:      General: She is not in acute distress.    Appearance: Normal appearance. She is well-developed. She is not ill-appearing.  Eyes:     General: No scleral icterus.    Extraocular Movements: Extraocular movements intact.     Conjunctiva/sclera: Conjunctivae normal.       Pupils: Pupils are equal, round, and reactive to light.  Cardiovascular:     Rate and Rhythm: Normal rate and regular rhythm.     Pulses: Normal pulses.     Heart sounds: Murmur (2/6 systolic best at USB) present.  Pulmonary:     Effort: Pulmonary effort is normal. No respiratory distress.     Breath sounds: Normal breath sounds. No wheezing, rhonchi or rales.  Musculoskeletal:     Cervical back: Normal range of motion and neck supple.     Comments: See HPI for foot exam if done  Lymphadenopathy:     Cervical: No cervical adenopathy.  Skin:    General: Skin is warm and dry.     Findings: No rash.  Neurological:     Mental Status: She is alert.  Psychiatric:        Mood and Affect: Mood normal.        Behavior: Behavior normal.       Results for orders placed or performed in visit on 07/11/19  Hemoglobin A1c  Result Value Ref Range   Hgb A1c MFr Bld 6.4 4.6 - 6.5 %  Lipid panel  Result Value Ref Range   Cholesterol 208 (H) 0 - 200 mg/dL   Triglycerides 120.0 0.0 - 149.0 mg/dL   HDL 46.60 >39.00 mg/dL   VLDL 24.0 0.0 - 40.0 mg/dL   LDL Cholesterol 137 (H) 0 - 99 mg/dL   Total CHOL/HDL Ratio 4    NonHDL 161.24   Comprehensive metabolic panel  Result Value Ref Range   Sodium 135 135 - 145 mEq/L   Potassium 4.3 3.5 - 5.1 mEq/L   Chloride 100 96 - 112 mEq/L   CO2 28 19 - 32 mEq/L   Glucose, Bld 167 (H) 70 - 99 mg/dL   BUN 14 6 - 23 mg/dL   Creatinine, Ser 0.70 0.40 - 1.20 mg/dL   Total Bilirubin 0.5 0.2 - 1.2 mg/dL   Alkaline Phosphatase 94 39 - 117 U/L   AST 15 0 - 37 U/L   ALT 12 0 - 35 U/L   Total Protein 7.6 6.0 - 8.3 g/dL   Albumin 4.4 3.5 - 5.2 g/dL   GFR 82.36 >60.00 mL/min   Calcium 9.7 8.4 - 10.5 mg/dL   Assessment & Plan:  This visit occurred during the SARS-CoV-2 public health emergency.  Safety protocols were in place, including screening questions prior to the visit, additional usage of staff PPE, and extensive cleaning of exam room while observing  appropriate contact time as indicated for disinfecting solutions.   Problem List Items Addressed This Visit    Hyperlipidemia associated with type 2 diabetes mellitus ([place])  Chronic, did not tolerate MWF crestor dosing. I recommended she start once weekly crestor, watching for effect on myalgias. Update if not tolerated - would consider other cholesterol medication (statin, bile acid sequestrant, etc).  The 10-year ASCVD risk score Mikey Bussing DC Brooke Bonito., et al., 2013) is: 15.8%   Values used to calculate the score:     Age: 9 years     Sex: Female     Is Non-Hispanic African American: No     Diabetic: Yes     Tobacco smoker: No     Systolic Blood Pressure: 201 mmHg     Is BP treated: No     HDL Cholesterol: 46.6 mg/dL     Total Cholesterol: 208 mg/dL       Relevant Medications   rosuvastatin (CRESTOR) 5 MG tablet   Controlled diabetes mellitus type 2 with complications ([place]) - Primary    Chronic, improved control based on A1c. Impaired fasting sugars persist. Will change metformin to 526m bid or 10069mall at night. Update with effect.       Relevant Medications   rosuvastatin (CRESTOR) 5 MG tablet       Meds ordered this encounter  Medications  . rosuvastatin (CRESTOR) 5 MG tablet    Sig: Take 1 tablet (5 mg total) by mouth once a week.    Dispense:  12 tablet    Refill:  3  . clobetasol (OLUX) 0.05 % topical foam    Sig: Apply topically 2 (two) times daily.    Dispense:  50 g    Refill:  0   No orders of the defined types were placed in this encounter.   Patient Instructions  Sugars are doing great - just remaining with impaired fasting sugars.  Change metformin to night time - start at 5005mwice daily with option to take 1000m22m night.  Return as needed or in 6 months for physical.  The 15-15 rule: Have 15 grams of carbohydrate to raise your blood sugar and check it after 15 minutes. If it's still below 70 mg/dL, have another serving. Repeat these steps until your  blood sugar is at least 70 mg/dL. Once your blood sugar is back to normal, eat a meal or snack to make sure it doesn't lower again.  This may be: -Glucose tablets (see instructions) -Gel tube (see instructions) -4 ounces (1/2 cup) of juice or regular soda (not diet) -1 tablespoon of sugar, honey, or corn syrup -Hard candies, jellybeans or gumdrops--see food label for how many to consume    Follow up plan: Return in about 6 months (around 01/13/2020) for annual exam, prior fasting for blood work, medicare wellness visit.  JaviRia Bush

## 2019-07-13 NOTE — Patient Instructions (Addendum)
Sugars are doing great - just remaining with impaired fasting sugars.  Change metformin to night time - start at 500mg  twice daily with option to take 1000mg  at night.  Return as needed or in 6 months for physical.  The 15-15 rule: Have 15 grams of carbohydrate to raise your blood sugar and check it after 15 minutes. If it's still below 70 mg/dL, have another serving. Repeat these steps until your blood sugar is at least 70 mg/dL. Once your blood sugar is back to normal, eat a meal or snack to make sure it doesn't lower again.  This may be: -Glucose tablets (see instructions) -Gel tube (see instructions) -4 ounces (1/2 cup) of juice or regular soda (not diet) -1 tablespoon of sugar, honey, or corn syrup -Hard candies, jellybeans or gumdrops--see food label for how many to consume

## 2019-07-13 NOTE — Assessment & Plan Note (Signed)
Chronic, improved control based on A1c. Impaired fasting sugars persist. Will change metformin to 500mg  bid or 1000mg  all at night. Update with effect.

## 2019-07-13 NOTE — Assessment & Plan Note (Signed)
Chronic, did not tolerate MWF crestor dosing. I recommended she start once weekly crestor, watching for effect on myalgias. Update if not tolerated - would consider other cholesterol medication (statin, bile acid sequestrant, etc).  The 10-year ASCVD risk score Mikey Bussing DC Brooke Bonito., et al., 2013) is: 15.8%   Values used to calculate the score:     Age: 74 years     Sex: Female     Is Non-Hispanic African American: No     Diabetic: Yes     Tobacco smoker: No     Systolic Blood Pressure: [id number] mmHg     Is BP treated: No     HDL Cholesterol: 46.6 mg/dL     Total Cholesterol: 208 mg/dL

## 2019-10-15 DIAGNOSIS — E119 Type 2 diabetes mellitus without complications: Secondary | ICD-10-CM | POA: Diagnosis not present

## 2019-10-15 LAB — HM DIABETES EYE EXAM

## 2019-10-18 ENCOUNTER — Encounter: Payer: Self-pay | Admitting: Family Medicine

## 2019-11-13 ENCOUNTER — Other Ambulatory Visit: Payer: Self-pay

## 2019-12-28 DIAGNOSIS — M7130 Other bursal cyst, unspecified site: Secondary | ICD-10-CM | POA: Diagnosis not present

## 2019-12-28 DIAGNOSIS — L301 Dyshidrosis [pompholyx]: Secondary | ICD-10-CM | POA: Diagnosis not present

## 2019-12-28 DIAGNOSIS — L821 Other seborrheic keratosis: Secondary | ICD-10-CM | POA: Diagnosis not present

## 2019-12-28 DIAGNOSIS — L218 Other seborrheic dermatitis: Secondary | ICD-10-CM | POA: Diagnosis not present

## 2019-12-28 DIAGNOSIS — L570 Actinic keratosis: Secondary | ICD-10-CM | POA: Diagnosis not present

## 2020-01-15 ENCOUNTER — Encounter: Payer: Self-pay | Admitting: Family Medicine

## 2020-01-15 ENCOUNTER — Other Ambulatory Visit: Payer: Self-pay | Admitting: Family Medicine

## 2020-01-15 DIAGNOSIS — E118 Type 2 diabetes mellitus with unspecified complications: Secondary | ICD-10-CM

## 2020-01-15 DIAGNOSIS — K760 Fatty (change of) liver, not elsewhere classified: Secondary | ICD-10-CM

## 2020-01-15 DIAGNOSIS — E785 Hyperlipidemia, unspecified: Secondary | ICD-10-CM

## 2020-01-16 ENCOUNTER — Other Ambulatory Visit (INDEPENDENT_AMBULATORY_CARE_PROVIDER_SITE_OTHER): Payer: PPO

## 2020-01-16 ENCOUNTER — Ambulatory Visit (INDEPENDENT_AMBULATORY_CARE_PROVIDER_SITE_OTHER): Payer: PPO

## 2020-01-16 ENCOUNTER — Other Ambulatory Visit: Payer: Self-pay

## 2020-01-16 DIAGNOSIS — K760 Fatty (change of) liver, not elsewhere classified: Secondary | ICD-10-CM

## 2020-01-16 DIAGNOSIS — E1169 Type 2 diabetes mellitus with other specified complication: Secondary | ICD-10-CM

## 2020-01-16 DIAGNOSIS — E118 Type 2 diabetes mellitus with unspecified complications: Secondary | ICD-10-CM

## 2020-01-16 DIAGNOSIS — Z0000 Encounter for general adult medical examination without abnormal findings: Secondary | ICD-10-CM | POA: Diagnosis not present

## 2020-01-16 DIAGNOSIS — E785 Hyperlipidemia, unspecified: Secondary | ICD-10-CM

## 2020-01-16 LAB — MICROALBUMIN / CREATININE URINE RATIO
Creatinine,U: 73.1 mg/dL
Microalb Creat Ratio: 1 mg/g (ref 0.0–30.0)
Microalb, Ur: <0.7 mg/dL (ref 0.0–1.9)

## 2020-01-16 LAB — COMPREHENSIVE METABOLIC PANEL
ALT: 31 U/L (ref 0–35)
AST: 29 U/L (ref 0–37)
Albumin: 4.3 g/dL (ref 3.5–5.2)
Alkaline Phosphatase: 80 U/L (ref 39–117)
BUN: 13 mg/dL (ref 6–23)
CO2: 26 mEq/L (ref 19–32)
Calcium: 9.2 mg/dL (ref 8.4–10.5)
Chloride: 101 mEq/L (ref 96–112)
Creatinine, Ser: 0.73 mg/dL (ref 0.40–1.20)
GFR: 78.35 mL/min (ref >60.00)
Glucose, Bld: 177 mg/dL — ABNORMAL HIGH (ref 70–99)
Potassium: 3.9 mEq/L (ref 3.5–5.1)
Sodium: 136 mEq/L (ref 135–145)
Total Bilirubin: 0.7 mg/dL (ref 0.2–1.2)
Total Protein: 7.2 g/dL (ref 6.0–8.3)

## 2020-01-16 LAB — LIPID PANEL
Cholesterol: 194 mg/dL (ref 0–200)
HDL: 41.8 mg/dL (ref >39.00)
LDL Cholesterol: 127 mg/dL — ABNORMAL HIGH (ref 0–99)
NonHDL: 151.78
Total CHOL/HDL Ratio: 5
Triglycerides: 125 mg/dL (ref 0.0–149.0)
VLDL: 25 mg/dL (ref 0.0–40.0)

## 2020-01-16 LAB — HEMOGLOBIN A1C: Hgb A1c MFr Bld: 7.4 % — ABNORMAL HIGH (ref 4.6–6.5)

## 2020-01-16 NOTE — Progress Notes (Signed)
Subjective:   Betty Sanders is a 74 y.o. female who presents for Medicare Annual (Subsequent) preventive examination.  Review of Systems: N/A      I connected with the patient today by telephone and verified that I am speaking with the correct person using two identifiers. Location patient: home Location nurse: work Persons participating in the telephone visit: patient, nurse.   I discussed the limitations, risks, security and privacy concerns of performing an evaluation and management service by telephone and the availability of in person appointments. I also discussed with the patient that there may be a patient responsible charge related to this service. The patient expressed understanding and verbally consented to this telephonic visit.        Cardiac Risk Factors include: advanced age (>4mn, >>51women);diabetes mellitus;Other (see comment), Risk factor comments: hyperlipidemia     Objective:    Today's Vitals   There is no height or weight on file to calculate BMI.  Advanced Directives 01/16/2020 01/10/2018 06/03/2015 05/20/2015 10/29/2013  Does Patient Have a Medical Advance Directive? Yes No No No Patient does not have advance directive;Patient would not like information  Type of AScientist, forensicPower of [place] will - - - -  Copy of [place] Chart? No - copy requested - - - -  Would patient like information on creating a medical advance directive? - Yes (MAU/Ambulatory/Procedural Areas - Information given) No - patient declined information - -    Current Medications (verified) Outpatient Encounter Medications as of 01/16/2020  Medication Sig  . aspirin (ASPIRIN EC) 81 MG EC tablet Take 1 tablet (81 mg total) by mouth every Monday, Wednesday, and Friday. Reported on 06/03/2015  . BIOTIN PO Take by mouth daily.  . Blood Glucose Monitoring Suppl (ONETOUCH VERIO IQ SYSTEM) w/Device KIT Check blood sugar twice a day and as  directed. Dx E11.9  . clobetasol (OLUX) 0.05 % topical foam Apply topically 2 (two) times daily.  .Marland Kitchenglimepiride (AMARYL) 1 MG tablet Take 1 tablet (1 mg total) by mouth daily with breakfast.  . metFORMIN (GLUCOPHAGE) 500 MG tablet Take 2 tablets (1,000 mg total) by mouth daily with breakfast.  . ONETOUCH DELICA LANCETS 319JMISC Check blood sugar twice a day and as directed. Dx E11.9  . ONETOUCH VERIO test strip CHECK BLOOD SUGAR TWICE A DAY AND AS DIRECTED. DX E11.9  . Probiotic Product (PROBIOTIC PO) Take by mouth daily.  . rosuvastatin (CRESTOR) 5 MG tablet Take 1 tablet (5 mg total) by mouth once a week.  . vitamin C (ASCORBIC ACID) 500 MG tablet Take 500 mg by mouth as needed.   No facility-administered encounter medications on file as of 01/16/2020.    Allergies (verified) Lipitor [atorvastatin], Pravastatin, and Sulfa antibiotics   History: Past Medical History:  Diagnosis Date  . Bilateral hearing loss 2016   hearing aides  . Diabetes mellitus without complication ([place]   . Dyslipidemia   . Heart murmur   . Hyperlipidemia   . Osteopenia 05/2014   [facility] . Squamous cell cancer of scalp and skin of neck 2013   s/p excision (Dr. WJimmye Norman  . Type 2 diabetes mellitus, uncontrolled ([place] 02/2012   DSME completed 07/2014   Past Surgical History:  Procedure Laterality Date  . BELPHAROPTOSIS REPAIR Bilateral 11/2016  . COLONOSCOPY  11/11   tubular adenoma polyp x 1; repeat 5 years--Dr. GCarlean Purl . COLONOSCOPY  05/2015   diverticulosis, rpt 10 yrs (Carlean Purl  .  DENTAL SURGERY  10/09/2017   dental implants  . DEXA  05/2014   spine -2.1, hip -1.4  . GANGLION CYST EXCISION  2006   Left wrist  . RHINOPLASTY  1970's  . WRIST SURGERY     Family History  Problem Relation Age of Onset  . Lung cancer Father   . Colon cancer Cousin   . Breast cancer Paternal Aunt   . Cervical cancer Maternal Aunt 52       female cancer  . Diabetes Maternal Grandfather   . Stroke Paternal  Grandfather   . CAD Neg Hx    Social History   Socioeconomic History  . Marital status: Widowed    Spouse name: Not on file  . Number of children: Not on file  . Years of education: Not on file  . Highest education level: Not on file  Occupational History  . Occupation: Agricultural engineer: [employer]  Tobacco Use  . Smoking status: Former Smoker    Quit date: 04/19/1968    Years since quitting: 51.7  . Smokeless tobacco: Never Used  Vaping Use  . Vaping Use: Never used  Substance and Sexual Activity  . Alcohol use: Yes    Alcohol/week: 0.0 standard drinks    Comment: Rare 1 glass wine per month  . Drug use: No  . Sexual activity: Not on file  Other Topics Concern  . 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   Activity: kayak, restarting treadmill, wears fit bit - wants to get to 10000steps/day   Diet: My.fitness.pal 1200cal/day, good water, fruits/vegetables daily   Social Determinants of Health   Financial Resource Strain: Low Risk   . Difficulty of Paying Living Expenses: Not hard at all  Food Insecurity: No Food Insecurity  . Worried About Charity fundraiser in the Last Year: Never true  . Ran Out of Food in the Last Year: Never true  Transportation Needs: No Transportation Needs  . Lack of Transportation (Medical): No  . Lack of Transportation (Non-Medical): No  Physical Activity: Sufficiently Active  . Days of Exercise per Week: 7 days  . Minutes of Exercise per Session: 60 min  Stress: No Stress Concern Present  . Feeling of Stress : Not at all  Social Connections:   . Frequency of Communication with Friends and Family: Not on file  . Frequency of Social Gatherings with Friends and Family: Not on file  . Attends Religious Services: Not on file  . Active Member of Clubs or Organizations: Not on  file  . Attends Archivist Meetings: Not on file  . Marital Status: Not on file    Tobacco Counseling Counseling given: Not Answered   Clinical Intake:  Pre-visit preparation completed: Yes  Pain : No/denies pain     Nutritional Risks: None Diabetes: Yes CBG done?: No Did pt. bring in CBG monitor from home?: No  How often do you need to have someone help you when you read instructions, pamphlets, or other written materials from your doctor or pharmacy?: 1 - Never What is the last grade level you completed in school?: masters  Diabetic: Yes Nutrition Risk Assessment:  Has the patient had any N/V/D within the last 2 months?  No  Does the patient have any non-healing wounds?  No  Has the patient had any unintentional weight loss or weight gain?  No   Diabetes:  Is the patient diabetic?  Yes  If diabetic, was a CBG obtained today?  No  Did the patient bring in their glucometer from home?  No  How often do you monitor your CBG's? Every morning.   Financial Strains and Diabetes Management:  Are you having any financial strains with the device, your supplies or your medication? No .  Does the patient want to be seen by Chronic Care Management for management of their diabetes?  No  Would the patient like to be referred to a Nutritionist or for Diabetic Management?  No   Diabetic Exams:  Diabetic Eye Exam: Completed 10/15/2019 Diabetic Foot Exam: Completed 07/13/2019   Interpreter Needed?: No  Information entered by :: CJohnson, LPN   Activities of Daily Living In your present state of health, do you have any difficulty performing the following activities: 01/16/2020  Hearing? Y  Comment wears hearing aids  Vision? N  Difficulty concentrating or making decisions? N  Walking or climbing stairs? N  Dressing or bathing? N  Doing errands, shopping? N  Preparing Food and eating ? N  Using the Toilet? N  In the past six months, have you accidently leaked  urine? N  Do you have problems with loss of bowel control? N  Managing your Medications? N  Managing your Finances? N  Housekeeping or managing your Housekeeping? N  Some recent data might be hidden    Patient Care Team: Ria Bush, MD as PCP - General  Indicate any recent Medical Services you may have received from other than [facility] providers in the past year (date may be approximate).     Assessment:   This is a routine wellness examination for [place].  Hearing/Vision screen  Hearing Screening   _0  _1  _2  _3  _4  _5  _6  _7  _8   Right ear:           Left ear:           Vision Screening Comments: Patient gets annual eye exams.  Dietary issues and exercise activities discussed: Current Exercise Habits: Home exercise routine, Type of exercise: walking, Time (Minutes): 60, Frequency (Times/Week): 7, Weekly Exercise (Minutes/Week): 420, Intensity: Moderate, Exercise limited by: None identified  Goals    . DIET - INCREASE WATER INTAKE     Starting 01/10/2018, I will continue to drink 2-3 liters of water daily.     . Patient Stated     01/16/2020, I will continue to walk my dog everyday for 2-5 miles.      Depression Screen PHQ 2/9 Scores 01/16/2020 01/17/2019 01/10/2018 10/29/2013  PHQ - 2 Score 0 0 1 0  PHQ- 9 Score 0 - 2 -    Fall Risk Fall Risk  01/16/2020 01/17/2019 01/10/2018 10/29/2013  Falls in the past year? 0 0 No No  Number falls in past yr: 0 - - -  Injury with Fall? 0 - - -  Risk for fall due to : No Fall Risks - - -  Follow up Falls evaluation completed;Falls prevention discussed - - -    Any stairs in or around the home? Yes  If so, are there any without handrails? No  Home free of loose throw rugs in walkways, pet beds, electrical cords, etc? Yes  Adequate lighting in your home to reduce risk of falls? Yes   ASSISTIVE DEVICES UTILIZED TO PREVENT FALLS:  Life alert? No  Use of  a cane, walker or w/c? No  Grab bars in the  bathroom? No  Shower chair or bench in shower? No  Elevated toilet seat or a handicapped toilet? No   TIMED UP AND GO:  Was the test performed? N/A, telephonic visit .    Cognitive Function: MMSE - Mini Mental State Exam 01/16/2020 01/10/2018  Orientation to time 5 5  Orientation to Place 5 5  Registration 3 3  Attention/ Calculation 5 0  Recall 3 3  Language- name 2 objects - 0  Language- repeat 1 1  Language- follow 3 step command - 3  Language- read & follow direction - 0  Write a sentence - 0  Copy design - 0  Total score - 20  Mini Cog  Mini-Cog screen was completed. Maximum score is 22. A value of 0 denotes this part of the MMSE was not completed or the patient failed this part of the Mini-Cog screening.       Immunizations Immunization History  Administered Date(s) Administered  . Fluad Quad(high Dose 65+) 01/17/2019  . Influenza Split 02/09/2012  . Influenza Whole 01/17/2013  . Influenza, Seasonal, Injecte, Preservative Fre 01/13/2015  . Influenza,inj,Quad PF,6+ Mos 01/18/2014, 01/10/2018  . Influenza-Unspecified 01/18/2016, 01/26/2017  . PFIZER SARS-COV-2 Vaccination 05/26/2019, 06/20/2019  . Pneumococcal Conjugate-13 09/12/2014  . Pneumococcal Polysaccharide-23 02/14/2013  . Td 10/02/2007  . Tdap 01/11/2018, 10/13/2018  . Zoster Recombinat (Shingrix) 10/15/2019, 12/27/2019, 12/27/2019    TDAP status: Up to date Flu Vaccine status: due, will get at upcoming office visit  Pneumococcal vaccine status: Up to date Covid-19 vaccine status: Completed vaccines  Qualifies for Shingles Vaccine? Yes   Zostavax completed No   Shingrix Completed?: Yes  Screening Tests Health Maintenance  Topic Date Due  . INFLUENZA VACCINE  11/18/2019  . URINE MICROALBUMIN  01/10/2020  . HEMOGLOBIN A1C  01/11/2020  . MAMMOGRAM  04/01/2020  . FOOT EXAM  07/12/2020  . OPHTHALMOLOGY EXAM  10/14/2020  . COLONOSCOPY  06/02/2025  . TETANUS/TDAP  10/12/2028  . DEXA SCAN   Completed  . COVID-19 Vaccine  Completed  . Hepatitis C Screening  Completed  . PNA vac Low Risk Adult  Completed    Health Maintenance  Health Maintenance Due  Topic Date Due  . INFLUENZA VACCINE  11/18/2019  . URINE MICROALBUMIN  01/10/2020  . HEMOGLOBIN A1C  01/11/2020    Colorectal cancer screening: Completed 06/03/2015. Repeat every 10 years Mammogram status: Completed 04/02/2019. Repeat every year Bone Density status: due, will discuss with provider at office visit   Lung Cancer Screening: (Low Dose CT Chest recommended if Age 23-80 years, 30 pack-year currently smoking OR have quit w/in 15years.) does not qualify.   Additional Screening:  Hepatitis C Screening: does qualify; Completed 10/28/2015  Vision Screening: Recommended annual ophthalmology exams for early detection of glaucoma and other disorders of the eye. Is the patient up to date with their annual eye exam?  Yes  Who is the provider or what is the name of the office in which the patient attends annual eye exams? Dr. Annamaria Helling If pt is not established with a provider, would they like to be referred to a provider to establish care? No .   Dental Screening: Recommended annual dental exams for proper oral hygiene  Community Resource Referral / Chronic Care Management: CRR required this visit?  No   CCM required this visit?  No      Plan:     I have personally reviewed and  noted the following in the patient's chart:   . Medical and social history . Use of alcohol, tobacco or illicit drugs  . Current medications and supplements . Functional ability and status . Nutritional status . Physical activity . Advanced directives . List of other physicians . Hospitalizations, surgeries, and ER visits in previous 12 months . Vitals . Screenings to include cognitive, depression, and falls . Referrals and appointments  In addition, I have reviewed and discussed with patient certain preventive protocols, quality  metrics, and best practice recommendations. A written personalized care plan for preventive services as well as general preventive health recommendations were provided to patient.   Due to this being a telephonic visit, the after visit summary with patients personalized plan was offered to patient via office or my-chart. Patient preferred to pick up at office at next visit or via mychart.   Andrez Grime, LPN   06/09/2667

## 2020-01-16 NOTE — Patient Instructions (Signed)
Betty Sanders , Thank you for taking time to come for your Medicare Wellness Visit. I appreciate your ongoing commitment to your health goals. Please review the following plan we discussed and let me know if I can assist you in the future.   Screening recommendations/referrals: Colonoscopy: Up to date, completed 06/03/2015, due 05/2025 Mammogram: Up to date, completed 04/02/2019, due 03/2020 Bone Density: due, will discuss wit provider at office visit  Recommended yearly ophthalmology/optometry visit for glaucoma screening and checkup Recommended yearly dental visit for hygiene and checkup  Vaccinations: Influenza vaccine: due, will complete at upcoming office visit  Pneumococcal vaccine: Completed series Tdap vaccine: Up to date, completed 10/13/2018, due 09/2028 Shingles vaccine: Completed series   Covid-19:Completed series  Advanced directives: Please bring a copy of your POA (Power of [place]) and/or Living Will to your next appointment.   Conditions/risks identified: diabetes, hyperlipidemia  Next appointment: Follow up in one year for your annual wellness visit    Preventive Care 72 Years and Older, Female Preventive care refers to lifestyle choices and visits with your health care provider that can promote health and wellness. What does preventive care include?  A yearly physical exam. This is also called an annual well check.  Dental exams once or twice a year.  Routine eye exams. Ask your health care provider how often you should have your eyes checked.  Personal lifestyle choices, including:  Daily care of your teeth and gums.  Regular physical activity.  Eating a healthy diet.  Avoiding tobacco and drug use.  Limiting alcohol use.  Practicing safe sex.  Taking low-dose aspirin every day.  Taking vitamin and mineral supplements as recommended by your health care provider. What happens during an annual well check? The services and screenings done by your  health care provider during your annual well check will depend on your age, overall health, lifestyle risk factors, and family history of disease. Counseling  Your health care provider may ask you questions about your:  Alcohol use.  Tobacco use.  Drug use.  Emotional well-being.  Home and relationship well-being.  Sexual activity.  Eating habits.  History of falls.  Memory and ability to understand (cognition).  Work and work Statistician.  Reproductive health. Screening  You may have the following tests or measurements:  Height, weight, and BMI.  Blood pressure.  Lipid and cholesterol levels. These may be checked every 5 years, or more frequently if you are over 72 years old.  Skin check.  Lung cancer screening. You may have this screening every year starting at age 72 if you have a 30-pack-year history of smoking and currently smoke or have quit within the past 15 years.  Fecal occult blood test (FOBT) of the stool. You may have this test every year starting at age 72.  Flexible sigmoidoscopy or colonoscopy. You may have a sigmoidoscopy every 5 years or a colonoscopy every 10 years starting at age 72.  Hepatitis C blood test.  Hepatitis B blood test.  Sexually transmitted disease (STD) testing.  Diabetes screening. This is done by checking your blood sugar (glucose) after you have not eaten for a while (fasting). You may have this done every 1-3 years.  Bone density scan. This is done to screen for osteoporosis. You may have this done starting at age 72.  Mammogram. This may be done every 1-2 years. Talk to your health care provider about how often you should have regular mammograms. Talk with your health care provider about your test results,  treatment options, and if necessary, the need for more tests. Vaccines  Your health care provider may recommend certain vaccines, such as:  Influenza vaccine. This is recommended every year.  Tetanus, diphtheria, and  acellular pertussis (Tdap, Td) vaccine. You may need a Td booster every 10 years.  Zoster vaccine. You may need this after age 74.  Pneumococcal 13-valent conjugate (PCV13) vaccine. One dose is recommended after age 55.  Pneumococcal polysaccharide (PPSV23) vaccine. One dose is recommended after age 59. Talk to your health care provider about which screenings and vaccines you need and how often you need them. This information is not intended to replace advice given to you by your health care provider. Make sure you discuss any questions you have with your health care provider. Document Released: 05/02/2015 Document Revised: 12/24/2015 Document Reviewed: 02/04/2015 Elsevier Interactive Patient Education  2017 [employer] Prevention in the Home Falls can cause injuries. They can happen to people of all ages. There are many things you can do to make your home safe and to help prevent falls. What can I do on the outside of my home?  Regularly fix the edges of walkways and driveways and fix any cracks.  Remove anything that might make you trip as you walk through a door, such as a raised step or threshold.  Trim any bushes or trees on the path to your home.  Use bright outdoor lighting.  Clear any walking paths of anything that might make someone trip, such as rocks or tools.  Regularly check to see if handrails are loose or broken. Make sure that both sides of any steps have handrails.  Any raised decks and porches should have guardrails on the edges.  Have any leaves, snow, or ice cleared regularly.  Use sand or salt on walking paths during winter.  Clean up any spills in your garage right away. This includes oil or grease spills. What can I do in the bathroom?  Use night lights.  Install grab bars by the toilet and in the tub and shower. Do not use towel bars as grab bars.  Use non-skid mats or decals in the tub or shower.  If you need to sit down in the shower, use  a plastic, non-slip stool.  Keep the floor dry. Clean up any water that spills on the floor as soon as it happens.  Remove soap buildup in the tub or shower regularly.  Attach bath mats securely with double-sided non-slip rug tape.  Do not have throw rugs and other things on the floor that can make you trip. What can I do in the bedroom?  Use night lights.  Make sure that you have a light by your bed that is easy to reach.  Do not use any sheets or blankets that are too big for your bed. They should not hang down onto the floor.  Have a firm chair that has side arms. You can use this for support while you get dressed.  Do not have throw rugs and other things on the floor that can make you trip. What can I do in the kitchen?  Clean up any spills right away.  Avoid walking on wet floors.  Keep items that you use a lot in easy-to-reach places.  If you need to reach something above you, use a strong step stool that has a grab bar.  Keep electrical cords out of the way.  Do not use floor polish or wax that makes floors  slippery. If you must use wax, use non-skid floor wax.  Do not have throw rugs and other things on the floor that can make you trip. What can I do with my stairs?  Do not leave any items on the stairs.  Make sure that there are handrails on both sides of the stairs and use them. Fix handrails that are broken or loose. Make sure that handrails are as long as the stairways.  Check any carpeting to make sure that it is firmly attached to the stairs. Fix any carpet that is loose or worn.  Avoid having throw rugs at the top or bottom of the stairs. If you do have throw rugs, attach them to the floor with carpet tape.  Make sure that you have a light switch at the top of the stairs and the bottom of the stairs. If you do not have them, ask someone to add them for you. What else can I do to help prevent falls?  Wear shoes that:  Do not have high heels.  Have  rubber bottoms.  Are comfortable and fit you well.  Are closed at the toe. Do not wear sandals.  If you use a stepladder:  Make sure that it is fully opened. Do not climb a closed stepladder.  Make sure that both sides of the stepladder are locked into place.  Ask someone to hold it for you, if possible.  Clearly mark and make sure that you can see:  Any grab bars or handrails.  First and last steps.  Where the edge of each step is.  Use tools that help you move around (mobility aids) if they are needed. These include:  Canes.  Walkers.  Scooters.  Crutches.  Turn on the lights when you go into a dark area. Replace any light bulbs as soon as they burn out.  Set up your furniture so you have a clear path. Avoid moving your furniture around.  If any of your floors are uneven, fix them.  If there are any pets around you, be aware of where they are.  Review your medicines with your doctor. Some medicines can make you feel dizzy. This can increase your chance of falling. Ask your doctor what other things that you can do to help prevent falls. This information is not intended to replace advice given to you by your health care provider. Make sure you discuss any questions you have with your health care provider. Document Released: 01/30/2009 Document Revised: 09/11/2015 Document Reviewed: 05/10/2014 Elsevier Interactive Patient Education  2017 [employer].

## 2020-01-16 NOTE — Progress Notes (Signed)
PCP notes:  Health Maintenance: Flu- Due Dexa- Due   Abnormal Screenings: none   Patient concerns: Constipation issues   Nurse concerns: none   Next PCP appt.: 01/18/2020 @ 9:30 am

## 2020-01-18 ENCOUNTER — Encounter: Payer: Self-pay | Admitting: Family Medicine

## 2020-01-18 ENCOUNTER — Ambulatory Visit (INDEPENDENT_AMBULATORY_CARE_PROVIDER_SITE_OTHER): Payer: PPO | Admitting: Family Medicine

## 2020-01-18 ENCOUNTER — Other Ambulatory Visit: Payer: Self-pay

## 2020-01-18 VITALS — BP 132/70 | HR 69 | Temp 98.1°F | Ht 63.5 in | Wt 176.5 lb

## 2020-01-18 DIAGNOSIS — E118 Type 2 diabetes mellitus with unspecified complications: Secondary | ICD-10-CM | POA: Diagnosis not present

## 2020-01-18 DIAGNOSIS — E785 Hyperlipidemia, unspecified: Secondary | ICD-10-CM | POA: Diagnosis not present

## 2020-01-18 DIAGNOSIS — E669 Obesity, unspecified: Secondary | ICD-10-CM | POA: Diagnosis not present

## 2020-01-18 DIAGNOSIS — M8580 Other specified disorders of bone density and structure, unspecified site: Secondary | ICD-10-CM

## 2020-01-18 DIAGNOSIS — Z0000 Encounter for general adult medical examination without abnormal findings: Secondary | ICD-10-CM | POA: Diagnosis not present

## 2020-01-18 DIAGNOSIS — E1169 Type 2 diabetes mellitus with other specified complication: Secondary | ICD-10-CM

## 2020-01-18 DIAGNOSIS — R011 Cardiac murmur, unspecified: Secondary | ICD-10-CM | POA: Diagnosis not present

## 2020-01-18 DIAGNOSIS — Z7189 Other specified counseling: Secondary | ICD-10-CM

## 2020-01-18 MED ORDER — ROSUVASTATIN CALCIUM 5 MG PO TABS
5.0000 mg | ORAL_TABLET | ORAL | 3 refills | Status: DC
Start: 2020-01-21 — End: 2020-04-24

## 2020-01-18 MED ORDER — COQ10 50 MG PO CAPS: 1.0000 | ORAL_CAPSULE | Freq: Two times a day (BID) | ORAL | Status: AC

## 2020-01-18 MED ORDER — GLIMEPIRIDE 1 MG PO TABS
1.0000 mg | ORAL_TABLET | Freq: Every day | ORAL | 3 refills | Status: DC
Start: 2020-01-18 — End: 2021-01-01

## 2020-01-18 MED ORDER — METFORMIN HCL 500 MG PO TABS
1000.0000 mg | ORAL_TABLET | Freq: Every day | ORAL | 3 refills | Status: DC
Start: 2020-01-18 — End: 2020-05-14

## 2020-01-18 NOTE — Assessment & Plan Note (Signed)
Advanced planning - has set up at home. Sister is HCPOA.asked to bring [place] copy.

## 2020-01-18 NOTE — Assessment & Plan Note (Signed)
Update DEXA through [facility].

## 2020-01-18 NOTE — Progress Notes (Addendum)
This visit was conducted in person.  BP 132/70 (BP Location: Left Arm, Patient Position: Sitting, Cuff Size: Normal)   Pulse 69   Temp 98.1 F (36.7 C) (Temporal)   Ht 5' 3.5" (1.613 m)   Wt 176 lb 8 oz (80.1 kg)   SpO2 98%   BMI 30.78 kg/m    CC: CPE Subjective:    Patient ID: Betty Sanders, female    DOB: [birth year], 74 y.o.   MRN: [id number]  HPI: Betty Sanders is a 74 y.o. female presenting on 01/18/2020 for Annual Exam (Prt 2. )   Saw health advisor this week for medicare wellness visit. Note reviewed.   No exam data present    Clinical Support from 01/16/2020 in [place] at [facility] Total Score 0      Fall Risk  01/16/2020 01/17/2019 01/10/2018 10/29/2013  Falls in the past year? 0 0 No No  Number falls in past yr: 0 - - -  Injury with Fall? 0 - - -  Risk for fall due to : No Fall Risks - - -  Follow up Falls evaluation completed;Falls prevention discussed - - -   New puppy at home. Increased exercise by walking regularly.   Preventative: COLONOSCOPY Date: 05/2015 diverticulosis, rpt 10 yrs Carlean Purl) Mammogram12/2020, [place] 1. Does breast exams at home.  Well woman exam - always normal. Last 10/2016. Fmhx (aunt) GYN cancer, unsure type. Will age out.  DEXA - osteopenia 05/2014. Good yogurt and ice cream and weight bearing exercise Flu shot yearly  Td 2009  [place] 05/2019, 06/2019 Pneumovax 01/2013, prevnar 2016 Shingrix - completed 2021 x2 Advanced planning - has set up at home. Sister is HCPOA.asked to bring [place] copy.  Seat belt use discussed Sunscreen use discussed - no changing moles on skin. Sees dermatology  Non smoker Alcohol - seldom Dentist - Q6 mo  Eye doctor- Yearly Bowel - constipation - managing with fruits/vegetables but that raises sugars. Better with activity.  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 Activity: kayak, restarting treadmill, wears fit bit - wants to get to 10000steps/day Diet: My.fitness.pal 1200cal/day, good water, fruits/vegetables daily     Relevant past medical, surgical, family and social history reviewed and updated as indicated. Interim medical history since our last visit reviewed. Allergies and medications reviewed and updated. Outpatient Medications Prior to Visit  Medication Sig Dispense Refill  . aspirin (ASPIRIN EC) 81 MG EC tablet Take 1 tablet (81 mg total) by mouth every Monday, Wednesday, and Friday. Reported on 06/03/2015    . BIOTIN PO Take by mouth daily.    . Blood Glucose Monitoring Suppl (ONETOUCH VERIO IQ SYSTEM) w/Device KIT Check blood sugar twice a day and as directed. Dx E11.9 1 kit 0  . clobetasol (OLUX) 0.05 % topical foam Apply topically 2 (two) times daily. 50 g 0  . ONETOUCH DELICA LANCETS 17E MISC Check blood sugar twice a day and as directed. Dx E11.9 100 each 6  . ONETOUCH VERIO test strip CHECK BLOOD SUGAR TWICE A DAY AND AS DIRECTED. DX E11.9 100 strip 2  . Probiotic Product (PROBIOTIC PO) Take by mouth daily.    . vitamin C (ASCORBIC ACID) 500 MG tablet Take 500 mg by mouth as needed.    Marland Kitchen glimepiride (AMARYL) 1 MG tablet Take 1 tablet (1 mg total) by mouth  daily with breakfast. 90 tablet 3  . metFORMIN (GLUCOPHAGE) 500 MG tablet Take 2 tablets (1,000 mg total) by mouth daily with breakfast. 180 tablet 3  . rosuvastatin (CRESTOR) 5 MG tablet Take 1 tablet (5 mg total) by mouth once a week. 12 tablet 3   No facility-administered medications prior to visit.     Per HPI unless specifically indicated in ROS section below Review of Systems  Constitutional: Negative for activity change, appetite change, chills, fatigue, fever and unexpected weight change.  HENT: Negative for hearing loss.   Eyes: Negative for visual disturbance.  Respiratory: Negative for cough, chest tightness, shortness of breath and wheezing.     Cardiovascular: Negative for chest pain, palpitations and leg swelling.  Gastrointestinal: Positive for constipation (see HPI). Negative for abdominal distention, abdominal pain, blood in stool, diarrhea, nausea and vomiting.  Genitourinary: Negative for difficulty urinating and hematuria.  Musculoskeletal: Negative for arthralgias, myalgias and neck pain.  Skin: Negative for rash.  Neurological: Negative for dizziness, seizures, syncope and headaches.  Hematological: Negative for adenopathy. Does not bruise/bleed easily.  Psychiatric/Behavioral: Negative for dysphoric mood. The patient is not nervous/anxious.    Objective:  BP 132/70 (BP Location: Left Arm, Patient Position: Sitting, Cuff Size: Normal)   Pulse 69   Temp 98.1 F (36.7 C) (Temporal)   Ht 5' 3.5" (1.613 m)   Wt 176 lb 8 oz (80.1 kg)   SpO2 98%   BMI 30.78 kg/m   Wt Readings from Last 3 Encounters:  01/18/20 176 lb 8 oz (80.1 kg)  07/13/19 174 lb 3 oz (79 kg)  01/17/19 178 lb 1 oz (80.8 kg)      Physical Exam Vitals and nursing note reviewed.  Constitutional:      General: She is not in acute distress.    Appearance: Normal appearance. She is well-developed. She is not ill-appearing.  HENT:     Head: Normocephalic and atraumatic.     Right Ear: Hearing, tympanic membrane, ear canal and external ear normal.     Left Ear: Hearing, tympanic membrane, ear canal and external ear normal.  Eyes:     General: No scleral icterus.    Extraocular Movements: Extraocular movements intact.     Conjunctiva/sclera: Conjunctivae normal.     Pupils: Pupils are equal, round, and reactive to light.  Neck:     Thyroid: No thyroid mass or thyromegaly.     Vascular: No carotid bruit.  Cardiovascular:     Rate and Rhythm: Normal rate and regular rhythm.     Pulses: Normal pulses.          Radial pulses are 2+ on the right side and 2+ on the left side.     Heart sounds: Murmur (3/6 systolic throughout) heard.   Pulmonary:      Effort: Pulmonary effort is normal. No respiratory distress.     Breath sounds: Normal breath sounds. No wheezing, rhonchi or rales.  Abdominal:     General: Abdomen is flat. Bowel sounds are normal. There is no distension.     Palpations: Abdomen is soft. There is no mass.     Tenderness: There is no abdominal tenderness. There is no guarding or rebound.     Hernia: No hernia is present.  Musculoskeletal:        General: Normal range of motion.     Cervical back: Normal range of motion and neck supple.     Right lower leg: No edema.     Left  lower leg: No edema.  Lymphadenopathy:     Cervical: No cervical adenopathy.  Skin:    General: Skin is warm and dry.     Findings: No rash.  Neurological:     General: No focal deficit present.     Mental Status: She is alert and oriented to person, place, and time.     Comments: CN grossly intact, station and gait intact  Psychiatric:        Mood and Affect: Mood normal.        Behavior: Behavior normal.        Thought Content: Thought content normal.        Judgment: Judgment normal.       Results for orders placed or performed in visit on 01/16/20  Microalbumin / creatinine urine ratio  Result Value Ref Range   Microalb, Ur <0.7 0.0 - 1.9 mg/dL   Creatinine,U 73.1 mg/dL   Microalb Creat Ratio 1.0 0.0 - 30.0 mg/g  Hemoglobin A1c  Result Value Ref Range   Hgb A1c MFr Bld 7.4 (H) 4.6 - 6.5 %  Comprehensive metabolic panel  Result Value Ref Range   Sodium 136 135 - 145 mEq/L   Potassium 3.9 3.5 - 5.1 mEq/L   Chloride 101 96 - 112 mEq/L   CO2 26 19 - 32 mEq/L   Glucose, Bld 177 (H) 70 - 99 mg/dL   BUN 13 6 - 23 mg/dL   Creatinine, Ser 0.73 0.40 - 1.20 mg/dL   Total Bilirubin 0.7 0.2 - 1.2 mg/dL   Alkaline Phosphatase 80 39 - 117 U/L   AST 29 0 - 37 U/L   ALT 31 0 - 35 U/L   Total Protein 7.2 6.0 - 8.3 g/dL   Albumin 4.3 3.5 - 5.2 g/dL   GFR 78.35 >60.00 mL/min   Calcium 9.2 8.4 - 10.5 mg/dL  Lipid panel  Result Value Ref  Range   Cholesterol 194 0 - 200 mg/dL   Triglycerides 125.0 0 - 149 mg/dL   HDL 41.80 >39.00 mg/dL   VLDL 25.0 0.0 - 40.0 mg/dL   LDL Cholesterol 127 (H) 0 - 99 mg/dL   Total CHOL/HDL Ratio 5    NonHDL 151.78    Assessment & Plan:  This visit occurred during the SARS-CoV-2 public health emergency.  Safety protocols were in place, including screening questions prior to the visit, additional usage of staff PPE, and extensive cleaning of exam room while observing appropriate contact time as indicated for disinfecting solutions.   Problem List Items Addressed This Visit    Systolic murmur    Likely MR and aortic sclerosis related.       Osteopenia    Update DEXA through [facility].       Relevant Orders   DG Bone Density   Obesity, Class I, BMI 30-34.9    Encouraged healthy diet and lifestyle choices to affect sustainable weight loss.       Hyperlipidemia associated with type 2 diabetes mellitus (HCC)    Chronic, LDL above goal. Advised to try twice weekly dosing. Suggested starting coq10 supplement. Previously did not tolerate MWF dosing.  The 10-year ASCVD risk score Mikey Bussing DC Jr., et al., 2013) is: 23.1%   Values used to calculate the score:     Age: 60 years     Sex: Female     Is Non-Hispanic African American: No     Diabetic: Yes     Tobacco smoker: No     Systolic  Blood Pressure: 132 mmHg     Is BP treated: No     HDL Cholesterol: 41.8 mg/dL     Total Cholesterol: 194 mg/dL       Relevant Medications   glimepiride (AMARYL) 1 MG tablet   metFORMIN (GLUCOPHAGE) 500 MG tablet   rosuvastatin (CRESTOR) 5 MG tablet (Start on 01/21/2020)   Healthcare maintenance - Primary    Preventative protocols reviewed and updated unless pt declined. Discussed healthy diet and lifestyle.       Controlled diabetes mellitus type 2 with complications (HCC)    Chronic, stable period. Continue current regimen.  Increased activity with new puppy at home.       Relevant Medications    glimepiride (AMARYL) 1 MG tablet   metFORMIN (GLUCOPHAGE) 500 MG tablet   rosuvastatin (CRESTOR) 5 MG tablet (Start on 01/21/2020)   Advanced care planning/counseling discussion    Advanced planning - has set up at home. Sister is HCPOA.asked to bring [place] copy.           Meds ordered this encounter  Medications  . Coenzyme Q10 (COQ10) 50 MG CAPS    Sig: Take 1 capsule by mouth in the morning and at bedtime.  Marland Kitchen glimepiride (AMARYL) 1 MG tablet    Sig: Take 1 tablet (1 mg total) by mouth daily with breakfast.    Dispense:  90 tablet    Refill:  3  . metFORMIN (GLUCOPHAGE) 500 MG tablet    Sig: Take 2 tablets (1,000 mg total) by mouth daily with breakfast.    Dispense:  180 tablet    Refill:  3  . rosuvastatin (CRESTOR) 5 MG tablet    Sig: Take 1 tablet (5 mg total) by mouth 2 (two) times a week.    Dispense:  25 tablet    Refill:  3   Orders Placed This Encounter  Procedures  . DG Bone Density    Standing Status:   Future    Standing Expiration Date:   01/17/2021    Order Specific Question:   Reason for Exam (SYMPTOM  OR DIAGNOSIS REQUIRED)    Answer:   f/u osteopenia    Order Specific Question:   Preferred imaging location?    Answer:   [facility]    Patient instructions: Flu shot today  When you call to schedule mammogram, ask to schedule bone density scan on same day (ordered today).  Bring [place] a copy of your living will.  Increase crestor to twice weekly.  Try coenzyme Q10 supplement to help muscle aches on statin.  Return in 6 months for diabetes check.   Follow up plan: Return in about 6 months (around 07/18/2020) for follow up visit.  Ria Bush, MD

## 2020-01-18 NOTE — Assessment & Plan Note (Signed)
Preventative protocols reviewed and updated unless pt declined. Discussed healthy diet and lifestyle.  

## 2020-01-18 NOTE — Patient Instructions (Addendum)
Flu shot today  When you call to schedule mammogram, ask to schedule bone density scan on same day (ordered today).  Bring [place] a copy of your living will.  Increase crestor to twice weekly.  Try coenzyme Q10 supplement to help muscle aches on statin.  Return in 6 months for diabetes check.   Health Maintenance After Age 72 After age 33, you are at a higher risk for certain long-term diseases and infections as well as injuries from falls. Falls are a major cause of broken bones and head injuries in people who are older than age 45. Getting regular preventive care can help to keep you healthy and well. Preventive care includes getting regular testing and making lifestyle changes as recommended by your health care provider. Talk with your health care provider about:  Which screenings and tests you should have. A screening is a test that checks for a disease when you have no symptoms.  A diet and exercise plan that is right for you. What should I know about screenings and tests to prevent falls? Screening and testing are the best ways to find a health problem early. Early diagnosis and treatment give you the best chance of managing medical conditions that are common after age 37. Certain conditions and lifestyle choices may make you more likely to have a fall. Your health care provider may recommend:  Regular vision checks. Poor vision and conditions such as cataracts can make you more likely to have a fall. If you wear glasses, make sure to get your prescription updated if your vision changes.  Medicine review. Work with your health care provider to regularly review all of the medicines you are taking, including over-the-counter medicines. Ask your health care provider about any side effects that may make you more likely to have a fall. Tell your health care provider if any medicines that you take make you feel dizzy or sleepy.  Osteoporosis screening. Osteoporosis is a condition that causes the  bones to get weaker. This can make the bones weak and cause them to break more easily.  Blood pressure screening. Blood pressure changes and medicines to control blood pressure can make you feel dizzy.  Strength and balance checks. Your health care provider may recommend certain tests to check your strength and balance while standing, walking, or changing positions.  Foot health exam. Foot pain and numbness, as well as not wearing proper footwear, can make you more likely to have a fall.  Depression screening. You may be more likely to have a fall if you have a fear of falling, feel emotionally low, or feel unable to do activities that you used to do.  Alcohol use screening. Using too much alcohol can affect your balance and may make you more likely to have a fall. What actions can I take to lower my risk of falls? General instructions  Talk with your health care provider about your risks for falling. Tell your health care provider if: ? You fall. Be sure to tell your health care provider about all falls, even ones that seem minor. ? You feel dizzy, sleepy, or off-balance.  Take over-the-counter and prescription medicines only as told by your health care provider. These include any supplements.  Eat a healthy diet and maintain a healthy weight. A healthy diet includes low-fat dairy products, low-fat (lean) meats, and fiber from whole grains, beans, and lots of fruits and vegetables. Home safety  Remove any tripping hazards, such as rugs, cords, and clutter.  Install  safety equipment such as grab bars in bathrooms and safety rails on stairs.  Keep rooms and walkways well-lit. Activity   Follow a regular exercise program to stay fit. This will help you maintain your balance. Ask your health care provider what types of exercise are appropriate for you.  If you need a cane or walker, use it as recommended by your health care provider.  Wear supportive shoes that have nonskid  soles. Lifestyle  Do not drink alcohol if your health care provider tells you not to drink.  If you drink alcohol, limit how much you have: ? 0-1 drink a day for women. ? 0-2 drinks a day for men.  Be aware of how much alcohol is in your drink. In the [place] one drink equals one typical bottle of beer (12 oz), one-half glass of wine (5 oz), or one shot of hard liquor (1 oz).  Do not use any products that contain nicotine or tobacco, such as cigarettes and e-cigarettes. If you need help quitting, ask your health care provider. Summary  Having a healthy lifestyle and getting preventive care can help to protect your health and wellness after age 66.  Screening and testing are the best way to find a health problem early and help you avoid having a fall. Early diagnosis and treatment give you the best chance for managing medical conditions that are more common for people who are older than age 16.  Falls are a major cause of broken bones and head injuries in people who are older than age 9. Take precautions to prevent a fall at home.  Work with your health care provider to learn what changes you can make to improve your health and wellness and to prevent falls. This information is not intended to replace advice given to you by your health care provider. Make sure you discuss any questions you have with your health care provider. Document Revised: 07/27/2018 Document Reviewed: 02/16/2017 Elsevier Patient Education  2020 [employer].

## 2020-01-18 NOTE — Assessment & Plan Note (Signed)
Chronic, LDL above goal. Advised to try twice weekly dosing. Suggested starting coq10 supplement. Previously did not tolerate MWF dosing.  The 10-year ASCVD risk score Mikey Bussing DC Brooke Bonito., et al., 2013) is: 23.1%   Values used to calculate the score:     Age: 74 years     Sex: Female     Is Non-Hispanic African American: No     Diabetic: Yes     Tobacco smoker: No     Systolic Blood Pressure: 211 mmHg     Is BP treated: No     HDL Cholesterol: 41.8 mg/dL     Total Cholesterol: 194 mg/dL

## 2020-01-18 NOTE — Addendum Note (Signed)
Addended by: Ria Bush on: 01/18/2020 10:17 AM   Modules accepted: Orders

## 2020-01-18 NOTE — Assessment & Plan Note (Signed)
Encouraged healthy diet and lifestyle choices to affect sustainable weight loss.  ?

## 2020-01-18 NOTE — Assessment & Plan Note (Addendum)
Likely MR and aortic sclerosis related.

## 2020-01-18 NOTE — Assessment & Plan Note (Signed)
Chronic, stable period. Continue current regimen.  Increased activity with new puppy at home.

## 2020-02-05 ENCOUNTER — Other Ambulatory Visit: Payer: Self-pay | Admitting: Family Medicine

## 2020-02-05 DIAGNOSIS — Z1231 Encounter for screening mammogram for malignant neoplasm of breast: Secondary | ICD-10-CM

## 2020-02-12 DIAGNOSIS — C4441 Basal cell carcinoma of skin of scalp and neck: Secondary | ICD-10-CM | POA: Diagnosis not present

## 2020-02-17 ENCOUNTER — Encounter: Payer: Self-pay | Admitting: Family Medicine

## 2020-02-18 DIAGNOSIS — Z23 Encounter for immunization: Secondary | ICD-10-CM | POA: Diagnosis not present

## 2020-02-18 NOTE — Telephone Encounter (Signed)
Updated pt's chart.  

## 2020-02-22 DIAGNOSIS — H2513 Age-related nuclear cataract, bilateral: Secondary | ICD-10-CM | POA: Diagnosis not present

## 2020-03-24 ENCOUNTER — Telehealth: Payer: Self-pay | Admitting: Family Medicine

## 2020-03-24 NOTE — Chronic Care Management (AMB) (Signed)
°  Chronic Care Management   Note  03/24/2020 Name: Betty Sanders MRN: [id number] DOB: 1945/11/25  Betty Sanders is a 74 y.o. year old female who is a primary care patient of Ria Bush, MD. I reached out to Betty Sanders by phone today in response to a referral sent by Ms. Tempie Hoist Mounsey's PCP, Ria Bush, MD.   [name] was given information about Chronic Care Management services today including:  1. CCM service includes personalized support from designated clinical staff supervised by her physician, including individualized plan of care and coordination with other care providers 2. 24/7 contact phone numbers for assistance for urgent and routine care needs. 3. Service will only be billed when office clinical staff spend 20 minutes or more in a month to coordinate care. 4. Only one practitioner may furnish and bill the service in a calendar month. 5. The patient may stop CCM services at any time (effective at the end of the month) by phone call to the office staff.   Patient agreed to services and verbal consent obtained.   Follow up plan:   [place]

## 2020-04-02 ENCOUNTER — Other Ambulatory Visit: Payer: Self-pay

## 2020-04-02 ENCOUNTER — Ambulatory Visit
Admission: RE | Admit: 2020-04-02 | Discharge: 2020-04-02 | Disposition: A | Discharge status: Home / Self Care | Payer: PPO | Source: Ambulatory Visit | Attending: Family Medicine | Admitting: Family Medicine

## 2020-04-02 DIAGNOSIS — Z1231 Encounter for screening mammogram for malignant neoplasm of breast: Secondary | ICD-10-CM | POA: Insufficient documentation

## 2020-04-02 DIAGNOSIS — Z780 Asymptomatic menopausal state: Secondary | ICD-10-CM | POA: Insufficient documentation

## 2020-04-02 DIAGNOSIS — M8589 Other specified disorders of bone density and structure, multiple sites: Secondary | ICD-10-CM | POA: Insufficient documentation

## 2020-04-02 DIAGNOSIS — Z13820 Encounter for screening for osteoporosis: Secondary | ICD-10-CM | POA: Diagnosis not present

## 2020-04-02 DIAGNOSIS — M8580 Other specified disorders of bone density and structure, unspecified site: Secondary | ICD-10-CM

## 2020-04-02 DIAGNOSIS — E119 Type 2 diabetes mellitus without complications: Secondary | ICD-10-CM | POA: Insufficient documentation

## 2020-04-24 ENCOUNTER — Telehealth: Payer: Self-pay

## 2020-04-24 DIAGNOSIS — E785 Hyperlipidemia, unspecified: Secondary | ICD-10-CM

## 2020-04-24 DIAGNOSIS — E1169 Type 2 diabetes mellitus with other specified complication: Secondary | ICD-10-CM

## 2020-04-24 MED ORDER — ROSUVASTATIN CALCIUM 5 MG PO TABS: 5.0000 mg | ORAL_TABLET | ORAL | 3 refills | Status: DC

## 2020-04-24 NOTE — Telephone Encounter (Signed)
Received fax request from [facility] requesting refill of rosuvastatin.  Last OV 01/18/20 Next OV 07/18/20 Last fill 10/1/2, # 25, take twice weekly, 3 RF  Sent in script

## 2020-04-28 DIAGNOSIS — H2512 Age-related nuclear cataract, left eye: Secondary | ICD-10-CM | POA: Diagnosis not present

## 2020-04-28 DIAGNOSIS — E119 Type 2 diabetes mellitus without complications: Secondary | ICD-10-CM | POA: Diagnosis not present

## 2020-04-30 ENCOUNTER — Encounter: Payer: Self-pay | Admitting: Ophthalmology

## 2020-04-30 ENCOUNTER — Other Ambulatory Visit: Payer: Self-pay

## 2020-05-02 ENCOUNTER — Other Ambulatory Visit
Admission: RE | Admit: 2020-05-02 | Discharge: 2020-05-02 | Disposition: A | Discharge status: Home / Self Care | Payer: PPO | Source: Ambulatory Visit | Attending: Ophthalmology | Admitting: Ophthalmology

## 2020-05-02 ENCOUNTER — Other Ambulatory Visit: Payer: Self-pay

## 2020-05-02 DIAGNOSIS — Z01812 Encounter for preprocedural laboratory examination: Secondary | ICD-10-CM | POA: Insufficient documentation

## 2020-05-02 DIAGNOSIS — Z20822 Contact with and (suspected) exposure to covid-19: Secondary | ICD-10-CM | POA: Insufficient documentation

## 2020-05-02 LAB — SARS CORONAVIRUS 2 (TAT 6-24 HRS): SARS Coronavirus 2: NEGATIVE

## 2020-05-05 ENCOUNTER — Other Ambulatory Visit: Admission: RE | Admit: 2020-05-05 | Payer: PPO | Source: Ambulatory Visit

## 2020-05-06 NOTE — Discharge Instructions (Signed)

## 2020-05-07 ENCOUNTER — Ambulatory Visit: Payer: PPO | Admitting: Anesthesiology

## 2020-05-07 ENCOUNTER — Encounter
Admission: RE | Disposition: A | Discharge status: Home / Self Care | Payer: Self-pay | Source: Home / Self Care | Attending: Ophthalmology

## 2020-05-07 ENCOUNTER — Ambulatory Visit
Admission: RE | Admit: 2020-05-07 | Discharge: 2020-05-07 | Disposition: A | Discharge status: Home / Self Care | Payer: PPO | Attending: Ophthalmology | Admitting: Ophthalmology

## 2020-05-07 ENCOUNTER — Encounter: Payer: Self-pay | Admitting: Ophthalmology

## 2020-05-07 ENCOUNTER — Other Ambulatory Visit: Payer: Self-pay

## 2020-05-07 DIAGNOSIS — Z882 Allergy status to sulfonamides status: Secondary | ICD-10-CM | POA: Insufficient documentation

## 2020-05-07 DIAGNOSIS — Z888 Allergy status to other drugs, medicaments and biological substances status: Secondary | ICD-10-CM | POA: Insufficient documentation

## 2020-05-07 DIAGNOSIS — E1136 Type 2 diabetes mellitus with diabetic cataract: Secondary | ICD-10-CM | POA: Insufficient documentation

## 2020-05-07 DIAGNOSIS — Z87891 Personal history of nicotine dependence: Secondary | ICD-10-CM | POA: Diagnosis not present

## 2020-05-07 DIAGNOSIS — H2512 Age-related nuclear cataract, left eye: Secondary | ICD-10-CM | POA: Insufficient documentation

## 2020-05-07 DIAGNOSIS — H25812 Combined forms of age-related cataract, left eye: Secondary | ICD-10-CM | POA: Diagnosis not present

## 2020-05-07 HISTORY — DX: Presence of external hearing-aid: Z97.4

## 2020-05-07 HISTORY — PX: CATARACT EXTRACTION W/PHACO: SHX586

## 2020-05-07 LAB — GLUCOSE, CAPILLARY
Glucose-Capillary: 222 mg/dL — ABNORMAL HIGH (ref 70–99)
Glucose-Capillary: 241 mg/dL — ABNORMAL HIGH (ref 70–99)

## 2020-05-07 SURGERY — PHACOEMULSIFICATION, CATARACT, WITH IOL INSERTION
Anesthesia: Monitor Anesthesia Care | Site: Eye | Laterality: Left

## 2020-05-07 MED ORDER — MIDAZOLAM HCL 2 MG/2ML IJ SOLN
INTRAMUSCULAR | Status: DC | PRN
  Administered 2020-05-07: 2 mg via INTRAVENOUS

## 2020-05-07 MED ORDER — BRIMONIDINE TARTRATE-TIMOLOL 0.2-0.5 % OP SOLN
OPHTHALMIC | Status: DC | PRN
  Administered 2020-05-07: 1 [drp] via OPHTHALMIC

## 2020-05-07 MED ORDER — EPINEPHRINE PF 1 MG/ML IJ SOLN
INTRAOCULAR | Status: DC | PRN
  Administered 2020-05-07: 69 mL via OPHTHALMIC

## 2020-05-07 MED ORDER — TETRACAINE HCL 0.5 % OP SOLN
1.0000 [drp] | OPHTHALMIC | Status: DC | PRN
  Administered 2020-05-07 (×3): 1 [drp] via OPHTHALMIC

## 2020-05-07 MED ORDER — LACTATED RINGERS IV SOLN: INTRAVENOUS | Status: DC

## 2020-05-07 MED ORDER — FENTANYL CITRATE (PF) 100 MCG/2ML IJ SOLN
INTRAMUSCULAR | Status: DC | PRN
  Administered 2020-05-07 (×2): 50 ug via INTRAVENOUS

## 2020-05-07 MED ORDER — NA HYALUR & NA CHOND-NA HYALUR 0.4-0.35 ML IO KIT
PACK | INTRAOCULAR | Status: DC | PRN
  Administered 2020-05-07: 1 mL via INTRAOCULAR

## 2020-05-07 MED ORDER — CEFUROXIME OPHTHALMIC INJECTION 1 MG/0.1 ML
INJECTION | OPHTHALMIC | Status: DC | PRN
  Administered 2020-05-07: 0.1 mL via INTRACAMERAL

## 2020-05-07 MED ORDER — ARMC OPHTHALMIC DILATING DROPS
1.0000 "application " | OPHTHALMIC | Status: DC | PRN
  Administered 2020-05-07 (×3): 1 via OPHTHALMIC

## 2020-05-07 MED ORDER — ONDANSETRON HCL 4 MG/2ML IJ SOLN
INTRAMUSCULAR | Status: DC | PRN
  Administered 2020-05-07: 4 mg via INTRAVENOUS

## 2020-05-07 SURGICAL SUPPLY — 22 items
CANNULA ANT/CHMB 27G (MISCELLANEOUS) ×1 IMPLANT
CANNULA ANT/CHMB 27GA (MISCELLANEOUS) ×2 IMPLANT
GLOVE SURG LX 7.5 STRW (GLOVE) ×1
GLOVE SURG LX STRL 7.5 STRW (GLOVE) ×1 IMPLANT
GLOVE SURG TRIUMPH 8.0 PF LTX (GLOVE) ×2 IMPLANT
GOWN STRL REUS W/ TWL LRG LVL3 (GOWN DISPOSABLE) ×2 IMPLANT
GOWN STRL REUS W/TWL LRG LVL3 (GOWN DISPOSABLE) ×4
LENS IOL TECNIS EYHANCE 24.5 (Intraocular Lens) ×1 IMPLANT
MARKER SKIN DUAL TIP RULER LAB (MISCELLANEOUS) ×2 IMPLANT
NDL CAPSULORHEX 25GA (NEEDLE) ×1 IMPLANT
NDL FILTER BLUNT 18X1 1/2 (NEEDLE) ×2 IMPLANT
NEEDLE CAPSULORHEX 25GA (NEEDLE) ×2 IMPLANT
NEEDLE FILTER BLUNT 18X 1/2SAF (NEEDLE) ×2
NEEDLE FILTER BLUNT 18X1 1/2 (NEEDLE) ×2 IMPLANT
PACK CATARACT BRASINGTON (MISCELLANEOUS) ×2 IMPLANT
PACK EYE AFTER SURG (MISCELLANEOUS) ×2 IMPLANT
PACK OPTHALMIC (MISCELLANEOUS) ×2 IMPLANT
SOLUTION OPHTHALMIC SALT (MISCELLANEOUS) ×2 IMPLANT
SYR 3ML LL SCALE MARK (SYRINGE) ×4 IMPLANT
SYR TB 1ML LUER SLIP (SYRINGE) ×2 IMPLANT
WATER STERILE IRR 250ML POUR (IV SOLUTION) ×2 IMPLANT
WIPE NON LINTING 3.25X3.25 (MISCELLANEOUS) ×2 IMPLANT

## 2020-05-07 NOTE — Op Note (Signed)
OPERATIVE NOTE  Betty Sanders [id number] 05/07/2020   PREOPERATIVE DIAGNOSIS:  Nuclear sclerotic cataract left eye. H25.12   POSTOPERATIVE DIAGNOSIS:    Nuclear sclerotic cataract left eye.     PROCEDURE:  Phacoemusification with posterior chamber intraocular lens placement of the left eye  Ultrasound time: Procedure(s) with comments: CATARACT EXTRACTION PHACO AND INTRAOCULAR LENS PLACEMENT (IOC) LEFT DIABETIC 8.70 01:28.6 9.8% (Left) - Diabetic - oral meds  LENS:   Implant Name Type Inv. Item Serial No. Manufacturer Lot No. LRB No. Used Action  LENS IOL TECNIS EYHANCE 24.5 - [id number] Intraocular Lens LENS IOL TECNIS EYHANCE 24.5 [id number] JOHNSON   Left 1 Implanted      SURGEON:  Wyonia Hough, MD   ANESTHESIA:  Topical with tetracaine drops and 2% Xylocaine jelly, augmented with 1% preservative-free intracameral lidocaine.    COMPLICATIONS:  None.   DESCRIPTION OF PROCEDURE:  The patient was identified in the holding room and transported to the operating room and placed in the supine position under the operating microscope.  The left eye was identified as the operative eye and it was prepped and draped in the usual sterile ophthalmic fashion.   A 1 millimeter clear-corneal paracentesis was made at the 1:30 position.  0.5 ml of preservative-free 1% lidocaine was injected into the anterior chamber.  The anterior chamber was filled with Viscoat viscoelastic.  A 2.4 millimeter keratome was used to make a near-clear corneal incision at the 10:30 position.  .  A curvilinear capsulorrhexis was made with a cystotome and capsulorrhexis forceps.  Balanced salt solution was used to hydrodissect and hydrodelineate the nucleus.   Phacoemulsification was then used in stop and chop fashion to remove the lens nucleus and epinucleus.  The remaining cortex was then removed using the irrigation and aspiration handpiece. Provisc was then placed into the capsular bag to distend it for  lens placement.  A lens was then injected into the capsular bag.  The remaining viscoelastic was aspirated.   Wounds were hydrated with balanced salt solution.  The anterior chamber was inflated to a physiologic pressure with balanced salt solution.  No wound leaks were noted. Cefuroxime 0.1 ml of a 10mg /ml solution was injected into the anterior chamber for a dose of 1 mg of intracameral antibiotic at the completion of the case.   Timolol and Brimonidine drops were applied to the eye.  The patient was taken to the recovery room in stable condition without complications of anesthesia or surgery.  Betty Sanders 05/07/2020, 8:04 AM

## 2020-05-07 NOTE — Anesthesia Preprocedure Evaluation (Addendum)
Anesthesia Evaluation  Patient identified by MRN, date of birth, ID band Patient awake    Reviewed: Allergy & Precautions, H&P , NPO status , Patient's Chart, lab work & pertinent test results, reviewed documented beta blocker date and time   Airway Mallampati: II  TM Distance: >3 FB Neck ROM: full    Dental no notable dental hx.    Pulmonary neg pulmonary ROS, former smoker,    Pulmonary exam normal breath sounds clear to auscultation       Cardiovascular Exercise Tolerance: Good + Valvular Problems/Murmurs (systolic murmur)  Rhythm:regular Rate:Normal     Neuro/Psych Bilateral hearing loss negative neurological ROS  negative psych ROS   GI/Hepatic NAFLD IBS   Endo/Other  diabetes, Type 2  Renal/GU negative Renal ROS  negative genitourinary   Musculoskeletal   Abdominal   Peds  Hematology negative hematology ROS (+)   Anesthesia Other Findings   Reproductive/Obstetrics negative OB ROS                             Anesthesia Physical Anesthesia Plan  ASA: II  Anesthesia Plan: MAC   Post-op Pain Management:    Induction:   PONV Risk Score and Plan: 2 and Treatment may vary due to age or medical condition  Airway Management Planned:   Additional Equipment:   Intra-op Plan:   Post-operative Plan:   Informed Consent: I have reviewed the patients History and Physical, chart, labs and discussed the procedure including the risks, benefits and alternatives for the proposed anesthesia with the patient or authorized representative who has indicated his/her understanding and acceptance.     Dental Advisory Given  Plan Discussed with: CRNA  Anesthesia Plan Comments:         Anesthesia Quick Evaluation

## 2020-05-07 NOTE — Anesthesia Procedure Notes (Signed)
Procedure Name: MAC Date/Time: 05/07/2020 7:46 AM Performed by: Jeannene Patella, CRNA Pre-anesthesia Checklist: Patient identified, Emergency Drugs available, Suction available, Timeout performed and Patient being monitored Patient Re-evaluated:Patient Re-evaluated prior to induction Oxygen Delivery Method: Nasal cannula Placement Confirmation: positive ETCO2

## 2020-05-07 NOTE — Transfer of Care (Signed)
Immediate Anesthesia Transfer of Care Note  Patient: Betty Sanders  Procedure(s) Performed: CATARACT EXTRACTION PHACO AND INTRAOCULAR LENS PLACEMENT (IOC) LEFT DIABETIC 8.70 01:28.6 9.8% (Left Eye)  Patient Location: PACU  Anesthesia Type: MAC  Level of Consciousness: awake, alert  and patient cooperative  Airway and Oxygen Therapy: Patient Spontanous Breathing and Patient connected to supplemental oxygen  Post-op Assessment: Post-op Vital signs reviewed, Patient's Cardiovascular Status Stable, Respiratory Function Stable, Patent Airway and No signs of Nausea or vomiting  Post-op Vital Signs: Reviewed and stable  Complications: No complications documented.

## 2020-05-07 NOTE — Addendum Note (Signed)
Addendum  created 05/07/20 0819 by Jeannene Patella, CRNA   Intraprocedure Meds edited

## 2020-05-07 NOTE — Anesthesia Postprocedure Evaluation (Signed)
Anesthesia Post Note  Patient: Betty Sanders  Procedure(s) Performed: CATARACT EXTRACTION PHACO AND INTRAOCULAR LENS PLACEMENT (IOC) LEFT DIABETIC 8.70 01:28.6 9.8% (Left Eye)     Patient location during evaluation: PACU Anesthesia Type: MAC Level of consciousness: awake and alert Pain management: pain level controlled Vital Signs Assessment: post-procedure vital signs reviewed and stable Respiratory status: spontaneous breathing, nonlabored ventilation, respiratory function stable and patient connected to nasal cannula oxygen Cardiovascular status: stable and blood pressure returned to baseline Postop Assessment: no apparent nausea or vomiting Anesthetic complications: no   No complications documented.  Alisa Graff

## 2020-05-07 NOTE — H&P (Signed)

## 2020-05-08 ENCOUNTER — Encounter: Payer: Self-pay | Admitting: Ophthalmology

## 2020-05-09 ENCOUNTER — Telehealth: Payer: PPO

## 2020-05-12 ENCOUNTER — Telehealth: Payer: Self-pay

## 2020-05-12 NOTE — Chronic Care Management (AMB) (Addendum)
Chronic Care Management Pharmacy Assistant   Name: SOMMER SPICKARD  MRN: [id number] DOB: April 29, 1945  Reason for Encounter:  Initial Questions for CCM Visit 05/14/20  PCP : Ria Bush, MD  Allergies:   Allergies  Allergen Reactions   Lipitor [Atorvastatin] Other (See Comments)    myalgia   Pravastatin Other (See Comments)   Sulfa Antibiotics     Possible rxn, unsure.    Medications: Outpatient Encounter Medications as of 05/12/2020  Medication Sig Note   aspirin (ASPIRIN EC) 81 MG EC tablet Take 1 tablet (81 mg total) by mouth every Monday, Wednesday, and Friday. Reported on 06/03/2015 05/07/2020: Not currently taking   BIOTIN PO Take by mouth daily.    Blood Glucose Monitoring Suppl (ONETOUCH VERIO IQ SYSTEM) w/Device KIT Check blood sugar twice a day and as directed. Dx E11.9    clobetasol (OLUX) 0.05 % topical foam Apply topically 2 (two) times daily.    Coenzyme Q10 (COQ10) 50 MG CAPS Take 1 capsule by mouth in the morning and at bedtime.    glimepiride (AMARYL) 1 MG tablet Take 1 tablet (1 mg total) by mouth daily with breakfast.    metFORMIN (GLUCOPHAGE) 500 MG tablet Take 2 tablets (1,000 mg total) by mouth daily with breakfast.    ONETOUCH DELICA LANCETS 18H MISC Check blood sugar twice a day and as directed. Dx E11.9    ONETOUCH VERIO test strip CHECK BLOOD SUGAR TWICE A DAY AND AS DIRECTED. DX E11.9    Probiotic Product (PROBIOTIC PO) Take by mouth daily.    rosuvastatin (CRESTOR) 5 MG tablet Take 1 tablet (5 mg total) by mouth 2 (two) times a week. (Patient not taking: Reported on 04/30/2020)    vitamin C (ASCORBIC ACID) 500 MG tablet Take 500 mg by mouth as needed.    No facility-administered encounter medications on file as of 05/12/2020.    Current Diagnosis: Patient Active Problem List   Diagnosis Date Noted   Medicare annual wellness visit, subsequent 01/17/2019   Advanced care planning/counseling discussion 11/01/2016   Obesity, Class I, BMI 30-34.9  63/14/9702   Systolic murmur 63/78/5885   Rash and nonspecific skin eruption 05/12/2016   NAFLD (nonalcoholic fatty liver disease) 10/31/2015   IBS (irritable bowel syndrome) 10/31/2015   Osteopenia 05/20/2014   Abdominal pain, other specified site 05/18/2012   DDD (degenerative disc disease), thoracic 05/18/2012   Hyperlipidemia associated with type 2 diabetes mellitus ([place])    Healthcare maintenance 02/23/2012   Controlled diabetes mellitus type 2 with complications ([place]) 02/77/4128   Personal history of colonic polyps 03/11/2010   Have you seen any other providers since your last visit? Yes  05/07/20- Dr. Leandrew Koyanagi- Ophthalmology- Cataract surgery  02/22/20- Dr. Leandrew Koyanagi- Ophthalmology  02/12/20- Dr. Jamse [place]- Dermatology  Any changes in your medications or health? No  Any side effects from any medications?  No  Do you have an symptoms or problems not managed by your medications? No  Any concerns about your health right now? Not at this time  Has your provider asked that you check blood pressure, blood sugar, or follow special diet at home? States she follows a diabetic diet   Do you get any type of exercise on a regular basis? Patient states she tries to walk about 3 miles a day.   Can you think of a goal you would like to reach for your health? Not at this time  Do you have any problems getting your medications? no  Is there anything that you would like to discuss during the appointment? Not at this time  Patient states she "picks and chooses when she takes her rosuvastatin" due to history of muscle pains with previous statins. She states that Dr. Danise Mina asks her to take it twice a week but sometimes she does not take it that often. Patient notes she had recent cataract surgery and is very happy with the outcome so far.   Patient reminded to have all medications, supplements and any blood sugar readings available for review with Debbora Dus,  Pharm. D, at their telephone visit on 05/14/20.     Follow-Up:  Pharmacist Review   Debbora Dus, CPP notified  Margaretmary Dys, [place] Pharmacy Assistant [phone]

## 2020-05-13 NOTE — Chronic Care Management (AMB) (Signed)
Chronic Care Management Pharmacy  Name: Betty Sanders  MRN: [id number] DOB: 02/06/1946   Initial Questions Reviewed  Chief Complaint/ HPI  Betty Sanders,  75 y.o. , female presents for their Initial CCM visit with the clinical pharmacist via telephone.  PCP : Ria Bush, MD  Their chronic conditions include: NAFLD, controlled diabetes, HLD, DDD, osteopenia  CCM consent 03/24/20  Office Visits: 01/18/20 - PCP visit - HLD advised to try twice weekly dosing. Suggested starting coq10 supplement. Previously did not tolerate MWF dosing. Osteopenia, update DEXA. A1c 7.4 stable no changes made   Consult Visit: 05/07/20 - Cataract surgery  Allergies  Allergen Reactions  . Lipitor [Atorvastatin] Other (See Comments)    myalgia  . Pravastatin Other (See Comments)  . Sulfa Antibiotics     Possible rxn, unsure.   Medications: Outpatient Encounter Medications as of 05/14/2020  Medication Sig Note  . Blood Glucose Monitoring Suppl (ONETOUCH VERIO IQ SYSTEM) w/Device KIT Check blood sugar twice a day and as directed. Dx E11.9   . Coenzyme Q10 (COQ10) 50 MG CAPS Take 1 capsule by mouth in the morning and at bedtime.   Marland Kitchen glimepiride (AMARYL) 1 MG tablet Take 1 tablet (1 mg total) by mouth daily with breakfast.   . metFORMIN (GLUCOPHAGE) 500 MG tablet Take 2 tablets (1,000 mg total) by mouth daily with breakfast.   . ONETOUCH DELICA LANCETS 43P MISC Check blood sugar twice a day and as directed. Dx E11.9   . ONETOUCH VERIO test strip CHECK BLOOD SUGAR TWICE A DAY AND AS DIRECTED. DX E11.9   . Probiotic Product (PROBIOTIC PO) Take by mouth daily.   . vitamin C (ASCORBIC ACID) 500 MG tablet Take 500 mg by mouth as needed.   . [DISCONTINUED] clobetasol (OLUX) 0.05 % topical foam Apply topically 2 (two) times daily.   Marland Kitchen aspirin (ASPIRIN EC) 81 MG EC tablet Take 1 tablet (81 mg total) by mouth every Monday, Wednesday, and Friday. Reported on 06/03/2015 (Patient not taking: Reported on  05/14/2020) 05/07/2020: Not currently taking  . rosuvastatin (CRESTOR) 5 MG tablet Take 1 tablet (5 mg total) by mouth 2 (two) times a week. (Patient not taking: Reported on 05/14/2020)   . [DISCONTINUED] BIOTIN PO Take by mouth daily. (Patient not taking: Reported on 05/14/2020)    No facility-administered encounter medications on file as of 05/14/2020.    Current Diagnosis/Assessment:  SDOH Interventions   Flowsheet Row Most Recent Value  SDOH Interventions   Financial Strain Interventions Intervention Not Indicated  [Meds accessible and affordable]     Goals Addressed            This Visit's Progress   . Pharmacy Care Plan       CARE PLAN ENTRY (see longitudinal plan of care for additional care plan information)  Current Barriers:  . Chronic Disease Management support, education, and care coordination needs related to Hyperlipidemia and Diabetes  Hyperlipidemia Lab Results  Component Value Date/Time   LDLCALC 127 (H) 01/16/2020 07:40 AM   LDLDIRECT 69.0 04/28/2016 08:08 AM   . Pharmacist Clinical Goal(s): o Over the next 3 months, patient will work with PharmD and providers to achieve LDL goal < 100 . Current regimen:  . Rosuvastatin 5 mg - 1 tablet twice weekly (not taking) . CoQ10 50 mg - 1 tablet daily . Interventions: o We discussed vitamin D deficiency may increase intolerance of statin. Recommend vitamin D 1000 IU daily considering bone health and previous vitamin D at  low end of normal. She will start vitamin D, then try rosuvastatin once weekly and increase to twice weekly after 2 weeks if tolerated. Reports increased exercise and better diet over the past year. . Patient self care activities - Over the next 3 months, patient will: o Start vitamin D 1000 IU daily o Resume rosuvastatin - start with once weekly then increase to twice weekly after 2 weeks if tolerated  Diabetes Lab Results  Component Value Date/Time   HGBA1C 7.4 (H) 01/16/2020 07:40 AM   HGBA1C 6.4  07/11/2019 08:10 AM   . Pharmacist Clinical Goal(s): o Over the next 3 months, patient will work with PharmD and providers to achieve A1c goal <7% . Current regimen:   Glimepiride 1 mg (Amaryl) - 1 tablet daily  Metformin 500 mg - 2 tablets daily with breakfast  . Interventions: o Consult PCP to consider increasing metformin to 3 tablets daily (1500 mg total) and extended release to improve morning BG readings.  . Patient self care activities - Over the next 3 months, patient will: o Check blood sugar once daily, document, and provide at future appointments o Continue current medications until further contact from clinic o Contact provider with any episodes of hypoglycemia  Initial goal documentation      Hyperlipidemia   LDL goal < 100  Last lipids Lab Results  Component Value Date   CHOL 194 01/16/2020   HDL 41.80 01/16/2020   LDLCALC 127 (H) 01/16/2020   LDLDIRECT 69.0 04/28/2016   TRIG 125.0 01/16/2020   CHOLHDL 5 01/16/2020   Hepatic Function Latest Ref Rng & Units 01/16/2020 07/11/2019 01/10/2019  Total Protein 6.0 - 8.3 g/dL 7.2 7.6 6.9  Albumin 3.5 - 5.2 g/dL 4.3 4.4 4.1  AST 0 - 37 U/L '29 15 16  ' ALT 0 - 35 U/L '31 12 13  ' Alk Phosphatase 39 - 117 U/L 80 94 80  Total Bilirubin 0.2 - 1.2 mg/dL 0.7 0.5 0.5    The 10-year ASCVD risk score Betty Bussing DC Jr., et al., 2013) is: 20.7%   Values used to calculate the score:     Age: 25 years     Sex: Female     Is Non-Hispanic African American: No     Diabetic: Yes     Tobacco smoker: No     Systolic Blood Pressure: 952 mmHg     Is BP treated: No     HDL Cholesterol: 41.8 mg/dL     Total Cholesterol: 194 mg/dL   Allergies  Allergen Reactions  . Lipitor [Atorvastatin] Other (See Comments)    myalgia  . Pravastatin Other (See Comments)  . Sulfa Antibiotics     Possible rxn, unsure.   Patient has failed these meds in past: see above  Patient is currently uncontrolled on the following medications:  . Rosuvastatin 5 mg  - 1 tablet twice weekly (not taking) . CoQ10 50 mg - 1 tablet daily . Aspirin 81 mg - 1 tablet daily (not taking)  We discussed:  She has not taken statin in past month due to concern for muscle aches. She was concerned with random pains and cataract surgery coming up. She continues CoQ10. She is not taking aspirin. Appropriate to remain off aspirin due to age/primary prevention. We discussed risks/benefit of statin. She was unable to tolerate MWF dosing and reports she has never had that kind of muscle pain prior to statin. We discussed vitamin D deficiency may increase intolerance of statin. Recommend vitamin D 1000 IU  daily considering bone health and previous vitamin D at low end of normal. She will start vitamin D, then try rosuvastatin once weekly and increase to twice weekly after 2 weeks if tolerated. Reports increased exercise and better diet over the past year.  Plan: Continue current medications; Start vitamin D 1000 IU daily. Resume rosuvastatin - start with once weekly then increase to twice weekly after 2 weeks if tolerated.  Diabetes   Lab Results  Component Value Date   CREATININE 0.73 01/16/2020   BUN 13 01/16/2020   GFR 78.35 01/16/2020   NA 136 01/16/2020   K 3.9 01/16/2020   CALCIUM 9.2 01/16/2020   CO2 26 01/16/2020   Recent Relevant Labs: Lab Results  Component Value Date/Time   HGBA1C 7.4 (H) 01/16/2020 07:40 AM   HGBA1C 6.4 07/11/2019 08:10 AM   MICROALBUR <0.7 01/16/2020 07:40 AM   MICROALBUR <0.7 01/10/2019 08:21 AM    A1c goal < 7% Checking BG: Daily  Patient has failed these meds in past: none reported Patient is currently uncontrolled on the following medications:   Glimepiride 1 mg (Amaryl) - 1 tablet daily  Metformin 500 mg - 2 tablets daily with breakfast   Last diabetic Foot exam:  Lab Results  Component Value Date/Time   HMDIABEYEEXA No Retinopathy 10/15/2019 12:00 AM    Last diabetic Eye exam:  Lab Results  Component Value Date/Time    HMDIABFOOTEX done 08/09/2012 12:00 AM    We discussed: Patient reports A1c was up in Sept due to diet, so no changes were made to medications. Checking BG in the morning daily before meals and before breakfast. Fasting BG ranging 170-200. Numbers are higher than they have been in the past despite diet changes. Upset stomach on 2000 mg metformin a day. Report she might could tolerate 1500 mg a day. She reports it easier to take all meds in the morning. If BG is higher than expected she will check again later in the day. Reports BG comes down after taking morning meds. Discussed benefit of ER metformin and increase to 1500 mg daily considering fasting BG above goal.   Plan: Consult PCP to consider switch to metformin ER 500 mg - 3 tablets every morning   Medication Management   OTCs:   Probiotic - 1 daily   Biotin - not taking anymore, not sure if its of any value, eating healthier now   Vitamin C - only takes at first sign of cold   Clobetasol - no longer using, completed course  Patient's preferred pharmacy is:  [id number] [place] [id number]: [phone]: [phone] We discussed: Pt reports no concerns getting medications when she needs them. No cost concerns.  Plan  Continue current medication management strategy  Follow up: 6 month phone visit; 4 weeks CMA call for medication review - cholesterol and diabetes   MDebbora Dus PharmD Clinical Pharmacist [phone]

## 2020-05-14 ENCOUNTER — Other Ambulatory Visit: Payer: Self-pay

## 2020-05-14 ENCOUNTER — Ambulatory Visit: Payer: PPO

## 2020-05-14 ENCOUNTER — Telehealth: Payer: Self-pay

## 2020-05-14 DIAGNOSIS — E118 Type 2 diabetes mellitus with unspecified complications: Secondary | ICD-10-CM

## 2020-05-14 DIAGNOSIS — E785 Hyperlipidemia, unspecified: Secondary | ICD-10-CM

## 2020-05-14 DIAGNOSIS — E1169 Type 2 diabetes mellitus with other specified complication: Secondary | ICD-10-CM

## 2020-05-14 MED ORDER — METFORMIN HCL ER 500 MG PO TB24: 1500.0000 mg | ORAL_TABLET | Freq: Every day | ORAL | 6 refills | Status: DC

## 2020-05-14 NOTE — Patient Instructions (Addendum)
May 14, 2020  Dear Betty Sanders,  It was a pleasure meeting you during our initial appointment on May 14, 2020. Below is a summary of the goals we discussed and components of chronic care management. Please contact me anytime with questions or concerns.   Visit Information  Goals Addressed            This Visit's Progress   . Pharmacy Care Plan       CARE PLAN ENTRY (see longitudinal plan of care for additional care plan information)  Current Barriers:  . Chronic Disease Management support, education, and care coordination needs related to Hyperlipidemia and Diabetes  Hyperlipidemia Lab Results  Component Value Date/Time   LDLCALC 127 (H) 01/16/2020 07:40 AM   LDLDIRECT 69.0 04/28/2016 08:08 AM   . Pharmacist Clinical Goal(s): o Over the next 3 months, patient will work with PharmD and providers to achieve LDL goal < 100 . Current regimen:  . Rosuvastatin 5 mg - 1 tablet twice weekly (not taking) . CoQ10 50 mg - 1 tablet daily . Interventions: o We discussed vitamin D deficiency may increase intolerance of statin. Recommend vitamin D 1000 IU daily considering bone health and previous vitamin D at low end of normal. She will start vitamin D, then try rosuvastatin once weekly and increase to twice weekly after 2 weeks if tolerated. Reports increased exercise and better diet over the past year. . Patient self care activities - Over the next 3 months, patient will: o Start vitamin D 1000 IU daily o Resume rosuvastatin - start with once weekly then increase to twice weekly after 2 weeks if tolerated  Diabetes Lab Results  Component Value Date/Time   HGBA1C 7.4 (H) 01/16/2020 07:40 AM   HGBA1C 6.4 07/11/2019 08:10 AM   . Pharmacist Clinical Goal(s): o Over the next 3 months, patient will work with PharmD and providers to achieve A1c goal <7% . Current regimen:   Glimepiride 1 mg (Amaryl) - 1 tablet daily  Metformin 500 mg - 2 tablets daily with breakfast   . Interventions: o Consult PCP to consider increasing metformin to 3 tablets daily (1500 mg total) and extended release to improve morning BG readings.  . Patient self care activities - Over the next 3 months, patient will: o Check blood sugar once daily, document, and provide at future appointments o Continue current medications until further contact from clinic o Contact provider with any episodes of hypoglycemia  Initial goal documentation       Betty Sanders was given information about Chronic Care Management services today including:  1. CCM service includes personalized support from designated clinical staff supervised by her physician, including individualized plan of care and coordination with other care providers 2. 24/7 contact phone numbers for assistance for urgent and routine care needs. 3. Standard insurance, coinsurance, copays and deductibles apply for chronic care management only during months in which we provide at least 20 minutes of these services. Most insurances cover these services at 100%, however patients may be responsible for any copay, coinsurance and/or deductible if applicable. This service may help you avoid the need for more expensive face-to-face services. 4. Only one practitioner may furnish and bill the service in a calendar month. 5. The patient may stop CCM services at any time (effective at the end of the month) by phone call to the office staff.  Patient agreed to services and verbal consent obtained.   The patient verbalized understanding of instructions, educational materials, and care plan  provided today and agreed to receive a mailed copy of patient instructions, educational materials, and care plan.    Telephone follow up appointment with pharmacy team member scheduled for: November 11, 2020 at 9:30 AM PHONE VISIT (My assistant will call in 4 weeks to discuss rosuvastatin and metformin tolerance)  Debbora Dus, PharmD Clinical Pharmacist [place]  [facility] at [facility] [phone]   Diabetes Mellitus and Nutrition, Adult When you have diabetes, or diabetes mellitus, it is very important to have healthy eating habits because your blood sugar (glucose) levels are greatly affected by what you eat and drink. Eating healthy foods in the right amounts, at about the same times every day, can help you:  Control your blood glucose.  Lower your risk of heart disease.  Improve your blood pressure.  Reach or maintain a healthy weight. What can affect my meal plan? Every person with diabetes is different, and each person has different needs for a meal plan. Your health care provider may recommend that you work with a dietitian to make a meal plan that is best for you. Your meal plan may vary depending on factors such as:  The calories you need.  The medicines you take.  Your weight.  Your blood glucose, blood pressure, and cholesterol levels.  Your activity level.  Other health conditions you have, such as heart or kidney disease. How do carbohydrates affect me? Carbohydrates, also called carbs, affect your blood glucose level more than any other type of food. Eating carbs naturally raises the amount of glucose in your blood. Carb counting is a method for keeping track of how many carbs you eat. Counting carbs is important to keep your blood glucose at a healthy level, especially if you use insulin or take certain oral diabetes medicines. It is important to know how many carbs you can safely have in each meal. This is different for every person. Your dietitian can help you calculate how many carbs you should have at each meal and for each snack. How does alcohol affect me? Alcohol can cause a sudden decrease in blood glucose (hypoglycemia), especially if you use insulin or take certain oral diabetes medicines. Hypoglycemia can be a life-threatening condition. Symptoms of hypoglycemia, such as sleepiness, dizziness, and confusion,  are similar to symptoms of having too much alcohol.  Do not drink alcohol if: ? Your health care provider tells you not to drink. ? You are pregnant, may be pregnant, or are planning to become pregnant.  If you drink alcohol: ? Do not drink on an empty stomach. ? Limit how much you use to:  0-1 drink a day for women.  0-2 drinks a day for men. ? Be aware of how much alcohol is in your drink. In the [place] one drink equals one 12 oz bottle of beer (355 mL), one 5 oz glass of wine (148 mL), or one 1 oz glass of hard liquor (44 mL). ? Keep yourself hydrated with water, diet soda, or unsweetened iced tea.  Keep in mind that regular soda, juice, and other mixers may contain a lot of sugar and must be counted as carbs. What are tips for following this plan? Reading food labels  Start by checking the serving size on the "Nutrition Facts" label of packaged foods and drinks. The amount of calories, carbs, fats, and other nutrients listed on the label is based on one serving of the item. Many items contain more than one serving per package.  Check the total grams (  g) of carbs in one serving. You can calculate the number of servings of carbs in one serving by dividing the total carbs by 15. For example, if a food has 30 g of total carbs per serving, it would be equal to 2 servings of carbs.  Check the number of grams (g) of saturated fats and trans fats in one serving. Choose foods that have a low amount or none of these fats.  Check the number of milligrams (mg) of salt (sodium) in one serving. Most people should limit total sodium intake to less than 2,300 mg per day.  Always check the nutrition information of foods labeled as "low-fat" or "nonfat." These foods may be higher in added sugar or refined carbs and should be avoided.  Talk to your dietitian to identify your daily goals for nutrients listed on the label. Shopping  Avoid buying canned, pre-made, or processed foods. These foods tend  to be high in fat, sodium, and added sugar.  Shop around the outside edge of the grocery store. This is where you will most often find fresh fruits and vegetables, bulk grains, fresh meats, and fresh dairy. Cooking  Use low-heat cooking methods, such as baking, instead of high-heat cooking methods like deep frying.  Cook using healthy oils, such as olive, canola, or sunflower oil.  Avoid cooking with butter, cream, or high-fat meats. Meal planning  Eat meals and snacks regularly, preferably at the same times every day. Avoid going long periods of time without eating.  Eat foods that are high in fiber, such as fresh fruits, vegetables, beans, and whole grains. Talk with your dietitian about how many servings of carbs you can eat at each meal.  Eat 4-6 oz (112-168 g) of lean protein each day, such as lean meat, chicken, fish, eggs, or tofu. One ounce (oz) of lean protein is equal to: ? 1 oz (28 g) of meat, chicken, or fish. ? 1 egg. ?  cup (62 g) of tofu.  Eat some foods each day that contain healthy fats, such as avocado, nuts, seeds, and fish.   What foods should I eat? Fruits Berries. Apples. Oranges. Peaches. Apricots. Plums. Grapes. Mango. Papaya. Pomegranate. Kiwi. Cherries. Vegetables Lettuce. Spinach. Leafy greens, including kale, chard, collard greens, and mustard greens. Beets. Cauliflower. Cabbage. Broccoli. Carrots. Green beans. Tomatoes. Peppers. Onions. Cucumbers. Brussels sprouts. Grains Whole grains, such as whole-wheat or whole-grain bread, crackers, tortillas, cereal, and pasta. Unsweetened oatmeal. Quinoa. Brown or wild rice. Meats and other proteins Seafood. Poultry without skin. Lean cuts of poultry and beef. Tofu. Nuts. Seeds. Dairy Low-fat or fat-free dairy products such as milk, yogurt, and cheese. The items listed above may not be a complete list of foods and beverages you can eat. Contact a dietitian for more information. What foods should I  avoid? Fruits Fruits canned with syrup. Vegetables Canned vegetables. Frozen vegetables with butter or cream sauce. Grains Refined white flour and flour products such as bread, pasta, snack foods, and cereals. Avoid all processed foods. Meats and other proteins Fatty cuts of meat. Poultry with skin. Breaded or fried meats. Processed meat. Avoid saturated fats. Dairy Full-fat yogurt, cheese, or milk. Beverages Sweetened drinks, such as soda or iced tea. The items listed above may not be a complete list of foods and beverages you should avoid. Contact a dietitian for more information. Questions to ask a health care provider  Do I need to meet with a diabetes educator?  Do I need to meet with a dietitian?  What number can I call if I have questions?  When are the best times to check my blood glucose? Where to find more information:  American Diabetes Association: diabetes.org  Academy of Nutrition and Dietetics: www.eatright.[employer] of Diabetes and Digestive and Kidney Diseases: [employer]  Association of Diabetes Care and Education Specialists: www.diabeteseducator.org Summary  It is important to have healthy eating habits because your blood sugar (glucose) levels are greatly affected by what you eat and drink.  A healthy meal plan will help you control your blood glucose and maintain a healthy lifestyle.  Your health care provider may recommend that you work with a dietitian to make a meal plan that is best for you.  Keep in mind that carbohydrates (carbs) and alcohol have immediate effects on your blood glucose levels. It is important to count carbs and to use alcohol carefully. This information is not intended to replace advice given to you by your health care provider. Make sure you discuss any questions you have with your health care provider. Document Revised: 03/13/2019 Document Reviewed: 03/13/2019 Elsevier Patient Education  2021 [employer].

## 2020-05-14 NOTE — Progress Notes (Signed)
I have collaborated with the care management provider regarding care management and care coordination activities outlined in this encounter and have reviewed this encounter including documentation in the note and care plan. I am certifying that I agree with the content of this note and encounter as supervising physician.  

## 2020-05-14 NOTE — Telephone Encounter (Signed)
Metformin XR 500mg  sent to take 3 a day.  30d supply sent to ensure tolerated before sending 3 mo supply.

## 2020-05-14 NOTE — Telephone Encounter (Signed)
PCP consult regarding CCM 05/14/20:  Patient reports checking BG daily and fasting levels 170-200. We discussed increasing her metformin to 3 tablets daily. She would also like to try extended release since she had GI upset with 2000 mg previously. Please send updated rx to [facility] [place] if you approve (metformin ER 500 mg - 3 tablets daily). My assistant will follow up in 4 weeks to review BG log and tolerance.  Lab Results  Component Value Date   CREATININE 0.73 01/16/2020   BUN 13 01/16/2020   GFR 78.35 01/16/2020   NA 136 01/16/2020   K 3.9 01/16/2020   CALCIUM 9.2 01/16/2020   CO2 26 01/16/2020   Lab Results  Component Value Date/Time   HGBA1C 7.4 (H) 01/16/2020 07:40 AM   HGBA1C 6.4 07/11/2019 08:10 AM   Thanks,  Debbora Dus, PharmD Clinical Pharmacist [place] [facility] at [facility] [phone]

## 2020-05-15 ENCOUNTER — Telehealth: Payer: Self-pay

## 2020-05-15 NOTE — Telephone Encounter (Signed)
Ria Comment, can you call and let patient know Dr. Darnell Level approved metformin dose increase. She should take 2 in the morning and 1 in the evening with meals. Patient plans to finish out current supply of metformin first then switch to the metformin ER. Please let her know if she has upset stomach with the three tablets a day on immediate release, to go ahead and pick up the ER since it can be easier on the stomach.  Thanks,  Debbora Dus, PharmD Clinical Pharmacist [place] [facility] at [facility] [phone]

## 2020-05-15 NOTE — Chronic Care Management (AMB) (Addendum)
Chronic Care Management Pharmacy Assistant   Name: Betty Sanders  MRN: [id number] DOB: 11-10-1945  Reason for Encounter: Metformin Dose Adjustment  PCP : Ria Bush, MD  Allergies:   Allergies  Allergen Reactions   Lipitor [Atorvastatin] Other (See Comments)    myalgia   Pravastatin Other (See Comments)   Sulfa Antibiotics     Possible rxn, unsure.    Medications: Outpatient Encounter Medications as of 05/15/2020  Medication Sig Note   aspirin (ASPIRIN EC) 81 MG EC tablet Take 1 tablet (81 mg total) by mouth every Monday, Wednesday, and Friday. Reported on 06/03/2015 (Patient not taking: Reported on 05/14/2020) 05/07/2020: Not currently taking   Blood Glucose Monitoring Suppl (ONETOUCH VERIO IQ SYSTEM) w/Device KIT Check blood sugar twice a day and as directed. Dx E11.9    Coenzyme Q10 (COQ10) 50 MG CAPS Take 1 capsule by mouth in the morning and at bedtime.    glimepiride (AMARYL) 1 MG tablet Take 1 tablet (1 mg total) by mouth daily with breakfast.    metFORMIN (GLUCOPHAGE XR) 500 MG 24 hr tablet Take 3 tablets (1,500 mg total) by mouth daily with breakfast.    ONETOUCH DELICA LANCETS 58K MISC Check blood sugar twice a day and as directed. Dx E11.9    ONETOUCH VERIO test strip CHECK BLOOD SUGAR TWICE A DAY AND AS DIRECTED. DX E11.9    Probiotic Product (PROBIOTIC PO) Take by mouth daily.    rosuvastatin (CRESTOR) 5 MG tablet Take 1 tablet (5 mg total) by mouth 2 (two) times a week. (Patient not taking: Reported on 05/14/2020)    vitamin C (ASCORBIC ACID) 500 MG tablet Take 500 mg by mouth as needed.    No facility-administered encounter medications on file as of 05/15/2020.    Current Diagnosis: Patient Active Problem List   Diagnosis Date Noted   Medicare annual wellness visit, subsequent 01/17/2019   Advanced care planning/counseling discussion 11/01/2016   Obesity, Class I, BMI 30-34.9 99/83/3825   Systolic murmur 05/39/7673   Rash and nonspecific skin  eruption 05/12/2016   NAFLD (nonalcoholic fatty liver disease) 10/31/2015   IBS (irritable bowel syndrome) 10/31/2015   Osteopenia 05/20/2014   Abdominal pain, other specified site 05/18/2012   DDD (degenerative disc disease), thoracic 05/18/2012   Hyperlipidemia associated with type 2 diabetes mellitus ([place])    Healthcare maintenance 02/23/2012   Controlled diabetes mellitus type 2 with complications ([place]) 41/93/7902   Personal history of colonic polyps 03/11/2010   Contacted patient to inform her Dr. Danise Mina approved metformin dose increase. She should take 2 in the morning and 1 in the evening with meals. Patient plans to finish out current supply of metformin first then switch to the metformin ER. Informed patient if she has upset stomach with the three tablets a day on immediate release, to go ahead and pick up the ER since it can be easier on the stomach. Patient agrees to plan. Patient notes that she tried the additional metformin last night and when she checked her blood sugar this morning it was 145. Patient aware to contact us with any concerns or questions.     Follow-Up:  Pharmacist Review   Debbora Dus, CPP notified  Margaretmary Dys, [place] Assistant [phone]  I have reviewed the care management and care coordination activities outlined in this encounter and I am certifying that I agree with the content of this note. No further action required.  Debbora Dus, PharmD Clinical Pharmacist [facility] at  [place] [phone]

## 2020-05-19 ENCOUNTER — Other Ambulatory Visit
Admission: RE | Admit: 2020-05-19 | Discharge: 2020-05-19 | Disposition: A | Discharge status: Home / Self Care | Payer: PPO | Source: Ambulatory Visit | Attending: Ophthalmology | Admitting: Ophthalmology

## 2020-05-19 ENCOUNTER — Other Ambulatory Visit: Payer: Self-pay

## 2020-05-19 DIAGNOSIS — Z20822 Contact with and (suspected) exposure to covid-19: Secondary | ICD-10-CM | POA: Insufficient documentation

## 2020-05-19 DIAGNOSIS — Z01812 Encounter for preprocedural laboratory examination: Secondary | ICD-10-CM | POA: Insufficient documentation

## 2020-05-19 LAB — SARS CORONAVIRUS 2 (TAT 6-24 HRS): SARS Coronavirus 2: NEGATIVE

## 2020-05-19 NOTE — Discharge Instructions (Signed)

## 2020-05-21 ENCOUNTER — Ambulatory Visit: Payer: PPO | Admitting: Anesthesiology

## 2020-05-21 ENCOUNTER — Encounter: Payer: Self-pay | Admitting: Ophthalmology

## 2020-05-21 ENCOUNTER — Ambulatory Visit
Admission: RE | Admit: 2020-05-21 | Discharge: 2020-05-21 | Disposition: A | Discharge status: Home / Self Care | Payer: PPO | Attending: Ophthalmology | Admitting: Ophthalmology

## 2020-05-21 ENCOUNTER — Other Ambulatory Visit: Payer: Self-pay

## 2020-05-21 ENCOUNTER — Encounter
Admission: RE | Disposition: A | Discharge status: Home / Self Care | Payer: Self-pay | Source: Home / Self Care | Attending: Ophthalmology

## 2020-05-21 DIAGNOSIS — H2511 Age-related nuclear cataract, right eye: Secondary | ICD-10-CM | POA: Insufficient documentation

## 2020-05-21 DIAGNOSIS — Z882 Allergy status to sulfonamides status: Secondary | ICD-10-CM | POA: Diagnosis not present

## 2020-05-21 DIAGNOSIS — E1136 Type 2 diabetes mellitus with diabetic cataract: Secondary | ICD-10-CM | POA: Diagnosis not present

## 2020-05-21 DIAGNOSIS — Z85828 Personal history of other malignant neoplasm of skin: Secondary | ICD-10-CM | POA: Diagnosis not present

## 2020-05-21 DIAGNOSIS — Z87891 Personal history of nicotine dependence: Secondary | ICD-10-CM | POA: Diagnosis not present

## 2020-05-21 DIAGNOSIS — H25811 Combined forms of age-related cataract, right eye: Secondary | ICD-10-CM | POA: Diagnosis not present

## 2020-05-21 HISTORY — PX: CATARACT EXTRACTION W/PHACO: SHX586

## 2020-05-21 LAB — GLUCOSE, CAPILLARY
Glucose-Capillary: 171 mg/dL — ABNORMAL HIGH (ref 70–99)
Glucose-Capillary: 206 mg/dL — ABNORMAL HIGH (ref 70–99)

## 2020-05-21 SURGERY — PHACOEMULSIFICATION, CATARACT, WITH IOL INSERTION
Anesthesia: Monitor Anesthesia Care | Site: Eye | Laterality: Right

## 2020-05-21 MED ORDER — BRIMONIDINE TARTRATE-TIMOLOL 0.2-0.5 % OP SOLN
OPHTHALMIC | Status: DC | PRN
  Administered 2020-05-21: 1 [drp] via OPHTHALMIC

## 2020-05-21 MED ORDER — LACTATED RINGERS IV SOLN: INTRAVENOUS | Status: DC

## 2020-05-21 MED ORDER — LIDOCAINE HCL (PF) 2 % IJ SOLN
INTRAOCULAR | Status: DC | PRN
  Administered 2020-05-21: 1 mL

## 2020-05-21 MED ORDER — CEFUROXIME OPHTHALMIC INJECTION 1 MG/0.1 ML
INJECTION | OPHTHALMIC | Status: DC | PRN
  Administered 2020-05-21: 0.1 mL via INTRACAMERAL

## 2020-05-21 MED ORDER — ONDANSETRON HCL 4 MG/2ML IJ SOLN: 4.0000 mg | Freq: Once | INTRAMUSCULAR | Status: DC | PRN

## 2020-05-21 MED ORDER — ACETAMINOPHEN 160 MG/5ML PO SOLN: 325.0000 mg | ORAL | Status: DC | PRN

## 2020-05-21 MED ORDER — TETRACAINE HCL 0.5 % OP SOLN
1.0000 [drp] | OPHTHALMIC | Status: DC | PRN
  Administered 2020-05-21 (×3): 1 [drp] via OPHTHALMIC

## 2020-05-21 MED ORDER — ARMC OPHTHALMIC DILATING DROPS
1.0000 "application " | OPHTHALMIC | Status: DC | PRN
  Administered 2020-05-21 (×3): 1 via OPHTHALMIC

## 2020-05-21 MED ORDER — MIDAZOLAM HCL 2 MG/2ML IJ SOLN
INTRAMUSCULAR | Status: DC | PRN
  Administered 2020-05-21: 2 mg via INTRAVENOUS

## 2020-05-21 MED ORDER — NA HYALUR & NA CHOND-NA HYALUR 0.4-0.35 ML IO KIT
PACK | INTRAOCULAR | Status: DC | PRN
  Administered 2020-05-21: 1 mL via INTRAOCULAR

## 2020-05-21 MED ORDER — FENTANYL CITRATE (PF) 100 MCG/2ML IJ SOLN
INTRAMUSCULAR | Status: DC | PRN
  Administered 2020-05-21: 100 ug via INTRAVENOUS

## 2020-05-21 MED ORDER — ACETAMINOPHEN 325 MG PO TABS: 325.0000 mg | ORAL_TABLET | ORAL | Status: DC | PRN

## 2020-05-21 MED ORDER — EPINEPHRINE PF 1 MG/ML IJ SOLN
INTRAOCULAR | Status: DC | PRN
  Administered 2020-05-21: 44 mL via OPHTHALMIC

## 2020-05-21 MED ORDER — ONDANSETRON HCL 4 MG/2ML IJ SOLN
INTRAMUSCULAR | Status: DC | PRN
  Administered 2020-05-21: 4 mg via INTRAVENOUS

## 2020-05-21 SURGICAL SUPPLY — 22 items
CANNULA ANT/CHMB 27G (MISCELLANEOUS) ×1 IMPLANT
CANNULA ANT/CHMB 27GA (MISCELLANEOUS) ×2 IMPLANT
GLOVE SURG LX 7.5 STRW (GLOVE) ×1
GLOVE SURG LX STRL 7.5 STRW (GLOVE) ×1 IMPLANT
GLOVE SURG TRIUMPH 8.0 PF LTX (GLOVE) ×2 IMPLANT
GOWN STRL REUS W/ TWL LRG LVL3 (GOWN DISPOSABLE) ×2 IMPLANT
GOWN STRL REUS W/TWL LRG LVL3 (GOWN DISPOSABLE) ×4
LENS IOL TECNIS EYHANCE 24.0 (Intraocular Lens) ×1 IMPLANT
MARKER SKIN DUAL TIP RULER LAB (MISCELLANEOUS) ×2 IMPLANT
NDL CAPSULORHEX 25GA (NEEDLE) ×1 IMPLANT
NDL FILTER BLUNT 18X1 1/2 (NEEDLE) ×2 IMPLANT
NEEDLE CAPSULORHEX 25GA (NEEDLE) ×2 IMPLANT
NEEDLE FILTER BLUNT 18X 1/2SAF (NEEDLE) ×2
NEEDLE FILTER BLUNT 18X1 1/2 (NEEDLE) ×2 IMPLANT
PACK CATARACT BRASINGTON (MISCELLANEOUS) ×2 IMPLANT
PACK EYE AFTER SURG (MISCELLANEOUS) ×2 IMPLANT
PACK OPTHALMIC (MISCELLANEOUS) ×2 IMPLANT
SOLUTION OPHTHALMIC SALT (MISCELLANEOUS) ×2 IMPLANT
SYR 3ML LL SCALE MARK (SYRINGE) ×4 IMPLANT
SYR TB 1ML LUER SLIP (SYRINGE) ×2 IMPLANT
WATER STERILE IRR 250ML POUR (IV SOLUTION) ×2 IMPLANT
WIPE NON LINTING 3.25X3.25 (MISCELLANEOUS) ×2 IMPLANT

## 2020-05-21 NOTE — Anesthesia Preprocedure Evaluation (Signed)
Anesthesia Evaluation  Patient identified by MRN, date of birth, ID band Patient awake    Reviewed: Allergy & Precautions, H&P , NPO status , Patient's Chart, lab work & pertinent test results, reviewed documented beta blocker date and time   Airway Mallampati: II  TM Distance: >3 FB Neck ROM: full    Dental no notable dental hx.    Pulmonary neg pulmonary ROS, former smoker,    Pulmonary exam normal breath sounds clear to auscultation       Cardiovascular Exercise Tolerance: Good negative cardio ROS   Rhythm:regular Rate:Normal     Neuro/Psych negative neurological ROS  negative psych ROS   GI/Hepatic NAFLD IBS   Endo/Other  diabetes, Type 2  Renal/GU negative Renal ROS  negative genitourinary   Musculoskeletal   Abdominal   Peds  Hematology negative hematology ROS (+)   Anesthesia Other Findings   Reproductive/Obstetrics negative OB ROS                             Anesthesia Physical Anesthesia Plan  ASA: II  Anesthesia Plan: MAC   Post-op Pain Management:    Induction:   PONV Risk Score and Plan: 2 and Treatment may vary due to age or medical condition  Airway Management Planned:   Additional Equipment:   Intra-op Plan:   Post-operative Plan:   Informed Consent: I have reviewed the patients History and Physical, chart, labs and discussed the procedure including the risks, benefits and alternatives for the proposed anesthesia with the patient or authorized representative who has indicated his/her understanding and acceptance.     Dental Advisory Given  Plan Discussed with: CRNA  Anesthesia Plan Comments:         Anesthesia Quick Evaluation

## 2020-05-21 NOTE — Op Note (Signed)
LOCATION:  [place]   PREOPERATIVE DIAGNOSIS:    Nuclear sclerotic cataract right eye. H25.11   POSTOPERATIVE DIAGNOSIS:  Nuclear sclerotic cataract right eye.     PROCEDURE:  Phacoemusification with posterior chamber intraocular lens placement of the right eye   ULTRASOUND TIME: Procedure(s): CATARACT EXTRACTION PHACO AND INTRAOCULAR LENS PLACEMENT (IOC) RIGHT DIABETIC 4.82 00:45.2 10.6% (Right)  LENS:   Implant Name Type Inv. Item Serial No. Manufacturer Lot No. LRB No. Used Action  LENS IOL TECNIS EYHANCE 24.0 - [id number] Intraocular Lens LENS IOL TECNIS EYHANCE 24.0 [id number] [id number]   Right 1 Implanted         SURGEON:  Wyonia Hough, MD   ANESTHESIA:  Topical with tetracaine drops and 2% Xylocaine jelly, augmented with 1% preservative-free intracameral lidocaine.    COMPLICATIONS:  None.   DESCRIPTION OF PROCEDURE:  The patient was identified in the holding room and transported to the operating room and placed in the supine position under the operating microscope.  The right eye was identified as the operative eye and it was prepped and draped in the usual sterile ophthalmic fashion.   A 1 millimeter clear-corneal paracentesis was made at the 12:00 position.  0.5 ml of preservative-free 1% lidocaine was injected into the anterior chamber. The anterior chamber was filled with Viscoat viscoelastic.  A 2.4 millimeter keratome was used to make a near-clear corneal incision at the 9:00 position.  A curvilinear capsulorrhexis was made with a cystotome and capsulorrhexis forceps.  Balanced salt solution was used to hydrodissect and hydrodelineate the nucleus.   Phacoemulsification was then used in stop and chop fashion to remove the lens nucleus and epinucleus.  The remaining cortex was then removed using the irrigation and aspiration handpiece. Provisc was then placed into the capsular bag to distend it for lens placement.  A lens was then injected into the  capsular bag.  The remaining viscoelastic was aspirated.   Wounds were hydrated with balanced salt solution.  The anterior chamber was inflated to a physiologic pressure with balanced salt solution.  No wound leaks were noted. Cefuroxime 0.1 ml of a 10mg /ml solution was injected into the anterior chamber for a dose of 1 mg of intracameral antibiotic at the completion of the case.   Timolol and Brimonidine drops were applied to the eye.  The patient was taken to the recovery room in stable condition without complications of anesthesia or surgery.   Enis Leatherwood 05/21/2020, 8:02 AM

## 2020-05-21 NOTE — H&P (Signed)

## 2020-05-21 NOTE — Anesthesia Procedure Notes (Signed)
Procedure Name: MAC Date/Time: 05/21/2020 7:50 AM Performed by: Silvana Newness, CRNA Pre-anesthesia Checklist: Patient identified, Emergency Drugs available, Suction available, Patient being monitored and Timeout performed Patient Re-evaluated:Patient Re-evaluated prior to induction Oxygen Delivery Method: Nasal cannula Placement Confirmation: positive ETCO2

## 2020-05-21 NOTE — Transfer of Care (Signed)
Immediate Anesthesia Transfer of Care Note  Patient: Betty Sanders  Procedure(s) Performed: CATARACT EXTRACTION PHACO AND INTRAOCULAR LENS PLACEMENT (IOC) RIGHT DIABETIC 4.82 00:45.2 10.6% (Right Eye)  Patient Location: PACU  Anesthesia Type: MAC  Level of Consciousness: awake, alert  and patient cooperative  Airway and Oxygen Therapy: Patient Spontanous Breathing and Patient connected to supplemental oxygen  Post-op Assessment: Post-op Vital signs reviewed, Patient's Cardiovascular Status Stable, Respiratory Function Stable, Patent Airway and No signs of Nausea or vomiting  Post-op Vital Signs: Reviewed and stable  Complications: No complications documented.

## 2020-05-21 NOTE — Anesthesia Postprocedure Evaluation (Signed)
Anesthesia Post Note  Patient: Betty Sanders  Procedure(s) Performed: CATARACT EXTRACTION PHACO AND INTRAOCULAR LENS PLACEMENT (IOC) RIGHT DIABETIC 4.82 00:45.2 10.6% (Right Eye)     Patient location during evaluation: PACU Anesthesia Type: MAC Level of consciousness: awake and alert Pain management: pain level controlled Vital Signs Assessment: post-procedure vital signs reviewed and stable Respiratory status: spontaneous breathing, nonlabored ventilation, respiratory function stable and patient connected to nasal cannula oxygen Cardiovascular status: stable and blood pressure returned to baseline Postop Assessment: no apparent nausea or vomiting Anesthetic complications: no   No complications documented.  Alisa Graff

## 2020-05-22 ENCOUNTER — Encounter: Payer: Self-pay | Admitting: Ophthalmology

## 2020-06-09 DIAGNOSIS — H903 Sensorineural hearing loss, bilateral: Secondary | ICD-10-CM | POA: Diagnosis not present

## 2020-06-12 DIAGNOSIS — H903 Sensorineural hearing loss, bilateral: Secondary | ICD-10-CM | POA: Diagnosis not present

## 2020-06-26 ENCOUNTER — Telehealth: Payer: Self-pay

## 2020-06-26 NOTE — Chronic Care Management (AMB) (Addendum)
Chronic Care Management Pharmacy Assistant   Name: Betty Sanders  MRN: [id number] DOB: Sep 11, 1945  Reason for Encounter: Disease State- DM and Cholesterol   Conditions to be addressed/monitored: HLD and DMII  Recent office visits:  No recent office visits since last CCM contact  Recent consult visits:  05/21/20- Dr. Leandrew Koyanagi- ophthalmology- [facility] visits:  None in previous 6 months  Medications: Outpatient Encounter Medications as of 06/26/2020  Medication Sig Note   aspirin (ASPIRIN EC) 81 MG EC tablet Take 1 tablet (81 mg total) by mouth every Monday, Wednesday, and Friday. Reported on 06/03/2015 (Patient not taking: Reported on 05/14/2020) 05/07/2020: Not currently taking   Blood Glucose Monitoring Suppl (ONETOUCH VERIO IQ SYSTEM) w/Device KIT Check blood sugar twice a day and as directed. Dx E11.9    Coenzyme Q10 (COQ10) 50 MG CAPS Take 1 capsule by mouth in the morning and at bedtime.    glimepiride (AMARYL) 1 MG tablet Take 1 tablet (1 mg total) by mouth daily with breakfast.    metFORMIN (GLUCOPHAGE XR) 500 MG 24 hr tablet Take 3 tablets (1,500 mg total) by mouth daily with breakfast.    ONETOUCH DELICA LANCETS 16R MISC Check blood sugar twice a day and as directed. Dx E11.9    ONETOUCH VERIO test strip CHECK BLOOD SUGAR TWICE A DAY AND AS DIRECTED. DX E11.9    Probiotic Product (PROBIOTIC PO) Take by mouth daily.    rosuvastatin (CRESTOR) 5 MG tablet Take 1 tablet (5 mg total) by mouth 2 (two) times a week. (Patient not taking: Reported on 05/14/2020)    vitamin C (ASCORBIC ACID) 500 MG tablet Take 500 mg by mouth as needed.    No facility-administered encounter medications on file as of 06/26/2020.     Recent Relevant Labs: Lab Results  Component Value Date/Time   HGBA1C 7.4 (H) 01/16/2020 07:40 AM   HGBA1C 6.4 07/11/2019 08:10 AM   MICROALBUR <0.7 01/16/2020 07:40 AM   MICROALBUR <0.7 01/10/2019 08:21 AM    Kidney Function Lab Results   Component Value Date/Time   CREATININE 0.73 01/16/2020 07:40 AM   CREATININE 0.70 07/11/2019 08:10 AM   GFR 78.35 01/16/2020 07:40 AM   06/26/2020 Name: Betty Sanders MRN: [id number] DOB: [birth year] Betty Sanders is a 75 y.o. year old female who is a primary care patient of Ria Bush, MD.  Comprehensive medication review performed; Spoke to patient regarding cholesterol  Lipid Panel    Component Value Date/Time   CHOL 194 01/16/2020 0740   TRIG 125.0 01/16/2020 0740   HDL 41.80 01/16/2020 0740   LDLCALC 127 (H) 01/16/2020 0740   LDLDIRECT 69.0 04/28/2016 0808    10-year ASCVD risk score: The 10-year ASCVD risk score Mikey Bussing DC Brooke Bonito., et al., 2013) is: 22.5%   Values used to calculate the score:     Age: 75 years     Sex: Female     Is Non-Hispanic African American: No     Diabetic: Yes     Tobacco smoker: No     Systolic Blood Pressure: 751 mmHg     Is BP treated: No     HDL Cholesterol: 41.8 mg/dL     Total Cholesterol: 194 mg/dL  Current antihyperlipidemic regimen:  Rosuvastatin 5 mg - 1 tablet twice weekly CoQ10 50 mg - 1 tablet daily Aspirin 81 mg - 1 tablet daily (not taking)  States she is having quite a bit of pain in her back and legs  taking Rosuvastatin. She has not taken it the last 2 doses. She states she has been taking vitamin D.  Previous antihyperlipidemic medications tried: Lipitor, pravastain  ASCVD risk enhancing conditions: age >65 and DM   What recent interventions/DTPs have been made by any provider to improve Cholesterol control since last CPP Visit:  Started patient on reduced dose of statin, added vitamin D.   Any recent hospitalizations or ED visits since last visit with CPP? No   What diet changes have been made to improve Cholesterol?  States she follows strict diet.   What exercise is being done to improve Cholesterol?  Walks 2 miles a day  Adherence Review: Does the patient have >5 day gap between last estimated fill  dates? No   Current antihyperglycemic regimen:  Metformin 500- 3 tablets with breakfast Glimepiride 1 mg- 1 tablet with breakfast Patient states she did increase metformin to 2 in the morning and 1 in the evening. She has been able to tolerate the increased dose.   What recent interventions/DTPs have been made to improve glycemic control:  Increased metformin  Have there been any recent hospitalizations or ED visits since last visit with CPP? No  Patient reports hypoglycemic symptoms, including Shaky  Patient denies hyperglycemic symptoms, including blurry vision, excessive thirst, fatigue, polyuria and weakness  How often are you checking your blood sugar? twice daily  What are your blood sugars ranging?  Fasting: 209, 203, 181, 166 Before meals: NA After meals: 86 Bedtime: NA  On insulin? No  During the week, how often does your blood glucose drop below 70? Never  Are you checking your feet daily/regularly? Yes  Adherence Review: Is the patient currently on a STATIN medication? Yes Is the patient currently on ACE/ARB medication? No Does the patient have >5 day gap between last estimated fill dates? No  Star Rating Drugs:  Medication:  Last Fill: Day Supply Rosuvastatin  04/24/20  84 Metformin  05/14/20 90  Glimepiride   04/17/20 90   Follow-Up:  Pharmacist Review  Michelle Adams, CPP notified  Lindsay Saintsing, CMA Clinical Pharmacy Assistant [phone]  I have reviewed the care management and care coordination activities outlined in this encounter and I am certifying that I agree with the content of this note. She has a follow up with PCP early April. Will request vitamin D with labs. Review BG log again next month.  Michelle Adams, PharmD Clinical Pharmacist [facility] [place] [phone]     

## 2020-07-09 ENCOUNTER — Telehealth: Payer: Self-pay

## 2020-07-09 DIAGNOSIS — E1169 Type 2 diabetes mellitus with other specified complication: Secondary | ICD-10-CM

## 2020-07-09 DIAGNOSIS — E118 Type 2 diabetes mellitus with unspecified complications: Secondary | ICD-10-CM

## 2020-07-09 DIAGNOSIS — E785 Hyperlipidemia, unspecified: Secondary | ICD-10-CM

## 2020-07-09 DIAGNOSIS — Z789 Other specified health status: Secondary | ICD-10-CM

## 2020-07-09 NOTE — Telephone Encounter (Signed)
Could we check vitamin D with upcoming labs? Would like to ensure she is not deficient considering statin intolerance. Debbora Dus, PharmD Clinical Pharmacist [facility] at [facility] [phone]

## 2020-07-10 NOTE — Telephone Encounter (Signed)
Ok to do - I have ordered.  Do you know if insurance will cover statin intolerance as indication to check vit D?

## 2020-07-10 NOTE — Telephone Encounter (Signed)
That's a great question! I do not know but I do believe osteopenia would be an indication for insurance purposes. If you are more familiar with the insurance coverage and think we should cancel the lab, feel free.  Thanks!

## 2020-07-14 ENCOUNTER — Other Ambulatory Visit (INDEPENDENT_AMBULATORY_CARE_PROVIDER_SITE_OTHER): Payer: PPO

## 2020-07-14 ENCOUNTER — Other Ambulatory Visit: Payer: Self-pay

## 2020-07-14 DIAGNOSIS — Z789 Other specified health status: Secondary | ICD-10-CM

## 2020-07-14 DIAGNOSIS — E118 Type 2 diabetes mellitus with unspecified complications: Secondary | ICD-10-CM

## 2020-07-14 DIAGNOSIS — E785 Hyperlipidemia, unspecified: Secondary | ICD-10-CM | POA: Diagnosis not present

## 2020-07-14 DIAGNOSIS — E1169 Type 2 diabetes mellitus with other specified complication: Secondary | ICD-10-CM | POA: Diagnosis not present

## 2020-07-14 LAB — LIPID PANEL
Cholesterol: 217 mg/dL — ABNORMAL HIGH (ref 0–200)
HDL: 42.2 mg/dL (ref >39.00)
LDL Cholesterol: 142 mg/dL — ABNORMAL HIGH (ref 0–99)
NonHDL: 174.91
Total CHOL/HDL Ratio: 5
Triglycerides: 165 mg/dL — ABNORMAL HIGH (ref 0.0–149.0)
VLDL: 33 mg/dL (ref 0.0–40.0)

## 2020-07-14 LAB — BASIC METABOLIC PANEL
BUN: 12 mg/dL (ref 6–23)
CO2: 27 mEq/L (ref 19–32)
Calcium: 9.5 mg/dL (ref 8.4–10.5)
Chloride: 101 mEq/L (ref 96–112)
Creatinine, Ser: 0.72 mg/dL (ref 0.40–1.20)
GFR: 83.44 mL/min (ref >60.00)
Glucose, Bld: 225 mg/dL — ABNORMAL HIGH (ref 70–99)
Potassium: 4.4 mEq/L (ref 3.5–5.1)
Sodium: 136 mEq/L (ref 135–145)

## 2020-07-14 LAB — HEMOGLOBIN A1C: Hgb A1c MFr Bld: 8 % — ABNORMAL HIGH (ref 4.6–6.5)

## 2020-07-14 LAB — VITAMIN D 25 HYDROXY (VIT D DEFICIENCY, FRACTURES): VITD: 36.78 ng/mL (ref 30.00–100.00)

## 2020-07-18 ENCOUNTER — Other Ambulatory Visit: Payer: Self-pay

## 2020-07-18 ENCOUNTER — Ambulatory Visit (INDEPENDENT_AMBULATORY_CARE_PROVIDER_SITE_OTHER): Payer: PPO | Admitting: Family Medicine

## 2020-07-18 ENCOUNTER — Encounter: Payer: Self-pay | Admitting: Family Medicine

## 2020-07-18 VITALS — BP 122/64 | HR 67 | Temp 97.9°F | Ht 64.0 in | Wt 176.1 lb

## 2020-07-18 DIAGNOSIS — Z789 Other specified health status: Secondary | ICD-10-CM | POA: Diagnosis not present

## 2020-07-18 DIAGNOSIS — E785 Hyperlipidemia, unspecified: Secondary | ICD-10-CM

## 2020-07-18 DIAGNOSIS — E118 Type 2 diabetes mellitus with unspecified complications: Secondary | ICD-10-CM

## 2020-07-18 DIAGNOSIS — E1169 Type 2 diabetes mellitus with other specified complication: Secondary | ICD-10-CM | POA: Diagnosis not present

## 2020-07-18 DIAGNOSIS — M791 Myalgia, unspecified site: Secondary | ICD-10-CM | POA: Insufficient documentation

## 2020-07-18 MED ORDER — EZETIMIBE 10 MG PO TABS: 10.0000 mg | ORAL_TABLET | Freq: Every day | ORAL | 11 refills | Status: DC

## 2020-07-18 MED ORDER — VITAMIN D3 25 MCG (1000 UT) PO CAPS: 1.0000 | ORAL_CAPSULE | Freq: Every day | ORAL | Status: AC

## 2020-07-18 MED ORDER — METFORMIN HCL ER 500 MG PO TB24: ORAL_TABLET | ORAL | 6 refills | Status: DC

## 2020-07-18 MED ORDER — ROSUVASTATIN CALCIUM 5 MG PO TABS: 5.0000 mg | ORAL_TABLET | ORAL | 3 refills | Status: DC

## 2020-07-18 NOTE — Patient Instructions (Addendum)
Drop crestor to once weekly dosing to see if better tolerated.  Start ezetimibe (Zetia) 10mg  daily new cholesterol medicine.  Continue metformin XR for now. If not achieving goal or not doing well, we will likely start once weekly injection (ozempic or trulicity).  Return in 6 months for physical/wellness visit.

## 2020-07-18 NOTE — Assessment & Plan Note (Signed)
Chronic, deteriorated despite recent changes.  Recent steroid eye drop course post cataract surgery contributing.  She will renew efforts at diabetic diet, regular exercise, and will try metformin XR 3 tablets/day. Continue amaryl. Discussed possible trial of GLP1RA or SGLT2i.  She will continue monitoring cbg's closely.  Reassess at 6 mo CPE, sooner if needed.

## 2020-07-18 NOTE — Assessment & Plan Note (Addendum)
LDL remains above goal, having trouble tolerating twice weekly crestor - will drop to once weekly and start zetia 10mg  daily. Discussed possible referral to pharmacy clinic to discuss PCSK9i eligibility.

## 2020-07-18 NOTE — Assessment & Plan Note (Signed)
Try once weekly crestor

## 2020-07-18 NOTE — Progress Notes (Signed)
Patient ID: Betty Sanders, female    DOB: 1945-09-08, 75 y.o.   MRN: [id number]  This visit was conducted in person.  BP 122/64   Pulse 67   Temp 97.9 F (36.6 C) (Temporal)   Ht _0  (1.626 m)   Wt 176 lb 1 oz (79.9 kg)   SpO2 96%   BMI 30.22 kg/m    CC: 6 mo f/u visit  Subjective:   HPI: Betty Sanders is a 75 y.o. female presenting on 07/18/2020 for Diabetes (Here for 6 mo f/u.  Wants to discuss metformin. )   Since last seen, had cataract extraction 04/2020  HLD - h/o statin intolerance. Prescribed crestor 12m twice weekly - ongoing myalgias - even has trouble walking dog.   DM - does regularly check sugars daily - yesterday 147, this morning 170s fasting. Wonders if prednisone eye drops contributed to increasing readings recently. Compliant with antihyperglycemic regimen which includes: metformin XR 5026m3 tablets daily, glimepiride 17m16maily. Denies low sugars or hypoglycemic symptoms. Denies paresthesias. Last diabetic eye exam 09/2019. Pneumovax: 01/2013. Prevnar: 05/2014. Glucometer brand: OneTouch. DSME: declined. Walking puppy 3mi34my. B[place] new [place].  Lab Results  Component Value Date   HGBA1C 8.0 (H) 07/14/2020   Diabetic Foot Exam - Simple   Simple Foot Form Diabetic Foot exam was performed with the following findings: Yes 07/18/2020 10:06 AM  Visual Inspection No deformities, no ulcerations, no other skin breakdown bilaterally: Yes Sensation Testing Intact to touch and monofilament testing bilaterally: Yes Pulse Check Posterior Tibialis and Dorsalis pulse intact bilaterally: Yes Comments    Lab Results  Component Value Date   MICROALBUR <0.7 01/16/2020       Relevant past medical, surgical, family and social history reviewed and updated as indicated. Interim medical history since our last visit reviewed. Allergies and medications reviewed and updated. Outpatient Medications Prior to Visit  Medication Sig  Dispense Refill  . Blood Glucose Monitoring Suppl (ONETOUCH VERIO IQ SYSTEM) w/Device KIT Check blood sugar twice a day and as directed. Dx E11.9 1 kit 0  . Coenzyme Q10 (COQ10) 50 MG CAPS Take 1 capsule by mouth in the morning and at bedtime.    . glMarland Kitchenmepiride (AMARYL) 1 MG tablet Take 1 tablet (1 mg total) by mouth daily with breakfast. 90 tablet 3  . ONETOUCH DELICA LANCETS 33G 30QC Check blood sugar twice a day and as directed. Dx E11.9 100 each 6  . ONETOUCH VERIO test strip CHECK BLOOD SUGAR TWICE A DAY AND AS DIRECTED. DX E11.9 100 strip 2  . Probiotic Product (PROBIOTIC PO) Take by mouth daily.    . vitamin C (ASCORBIC ACID) 500 MG tablet Take 500 mg by mouth as needed.    . metFORMIN (GLUCOPHAGE XR) 500 MG 24 hr tablet Take 3 tablets (1,500 mg total) by mouth daily with breakfast. 90 tablet 6  . rosuvastatin (CRESTOR) 5 MG tablet Take 1 tablet (5 mg total) by mouth 2 (two) times a week. 25 tablet 3  . aspirin (ASPIRIN EC) 81 MG EC tablet Take 1 tablet (81 mg total) by mouth every Monday, Wednesday, and Friday. Reported on 06/03/2015 (Patient not taking: Reported on 05/14/2020)     No facility-administered medications prior to visit.     Per HPI unless specifically indicated in ROS section below Review of Systems Objective:  BP 122/64   Pulse 67   Temp 97.9 F (36.6 C) (Temporal)   Ht 5'  4" (1.626 m)   Wt 176 lb 1 oz (79.9 kg)   SpO2 96%   BMI 30.22 kg/m   Wt Readings from Last 3 Encounters:  07/18/20 176 lb 1 oz (79.9 kg)  05/21/20 176 lb (79.8 kg)  05/07/20 177 lb (80.3 kg)      Physical Exam Vitals and nursing note reviewed.  Constitutional:      General: She is not in acute distress.    Appearance: Normal appearance. She is well-developed. She is not ill-appearing.  HENT:     Head: Normocephalic and atraumatic.  Eyes:     General: No scleral icterus.    Extraocular Movements: Extraocular movements intact.     Conjunctiva/sclera: Conjunctivae normal.     Pupils:  Pupils are equal, round, and reactive to light.  Cardiovascular:     Rate and Rhythm: Normal rate and regular rhythm.     Pulses: Normal pulses.     Heart sounds: Normal heart sounds. No murmur heard.   Pulmonary:     Effort: Pulmonary effort is normal. No respiratory distress.     Breath sounds: Normal breath sounds. No wheezing, rhonchi or rales.  Musculoskeletal:     Cervical back: Normal range of motion and neck supple.     Right lower leg: No edema.     Left lower leg: No edema.     Comments: See HPI for foot exam if done  Lymphadenopathy:     Cervical: No cervical adenopathy.  Skin:    General: Skin is warm and dry.     Findings: No rash.  Neurological:     Mental Status: She is alert.  Psychiatric:        Mood and Affect: Mood normal.        Behavior: Behavior normal.       Results for orders placed or performed in visit on 68/11/57  Basic metabolic panel  Result Value Ref Range   Sodium 136 135 - 145 mEq/L   Potassium 4.4 3.5 - 5.1 mEq/L   Chloride 101 96 - 112 mEq/L   CO2 27 19 - 32 mEq/L   Glucose, Bld 225 (H) 70 - 99 mg/dL   BUN 12 6 - 23 mg/dL   Creatinine, Ser 0.72 0.40 - 1.20 mg/dL   GFR 83.44 >60.00 mL/min   Calcium 9.5 8.4 - 10.5 mg/dL  Hemoglobin A1c  Result Value Ref Range   Hgb A1c MFr Bld 8.0 (H) 4.6 - 6.5 %  Lipid panel  Result Value Ref Range   Cholesterol 217 (H) 0 - 200 mg/dL   Triglycerides 165.0 (H) 0.0 - 149.0 mg/dL   HDL 42.20 >39.00 mg/dL   VLDL 33.0 0.0 - 40.0 mg/dL   LDL Cholesterol 142 (H) 0 - 99 mg/dL   Total CHOL/HDL Ratio 5    NonHDL 174.91   VITAMIN D 25 Hydroxy (Vit-D Deficiency, Fractures)  Result Value Ref Range   VITD 36.78 30.00 - 100.00 ng/mL   Assessment & Plan:  This visit occurred during the SARS-CoV-2 public health emergency.  Safety protocols were in place, including screening questions prior to the visit, additional usage of staff PPE, and extensive cleaning of exam room while observing appropriate contact time  as indicated for disinfecting solutions.   Problem List Items Addressed This Visit    Controlled diabetes mellitus type 2 with complications ([place]) - Primary    Chronic, deteriorated despite recent changes.  Recent steroid eye drop course post cataract surgery contributing.  She will  renew efforts at diabetic diet, regular exercise, and will try metformin XR 3 tablets/day. Continue amaryl. Discussed possible trial of GLP1RA or SGLT2i.  She will continue monitoring cbg's closely.  Reassess at 6 mo CPE, sooner if needed.       Relevant Medications   metFORMIN (GLUCOPHAGE XR) 500 MG 24 hr tablet   rosuvastatin (CRESTOR) 5 MG tablet   Hyperlipidemia associated with type 2 diabetes mellitus ([place])    LDL remains above goal, having trouble tolerating twice weekly crestor - will drop to once weekly and start zetia 37m daily. Discussed possible referral to pharmacy clinic to discuss PCSK9i eligibility.       Relevant Medications   metFORMIN (GLUCOPHAGE XR) 500 MG 24 hr tablet   rosuvastatin (CRESTOR) 5 MG tablet   ezetimibe (ZETIA) 10 MG tablet   Statin intolerance    Try once weekly crestor          Meds ordered this encounter  Medications  . metFORMIN (GLUCOPHAGE XR) 500 MG 24 hr tablet    Sig: Take 2 tablets (1,000 mg total) by mouth daily with breakfast AND 1 tablet (500 mg total) at bedtime.    Dispense:  90 tablet    Refill:  6    Note new sig  . Cholecalciferol (VITAMIN D3) 25 MCG (1000 UT) CAPS    Sig: Take 1 capsule (1,000 Units total) by mouth daily.    Dispense:  30 capsule  . rosuvastatin (CRESTOR) 5 MG tablet    Sig: Take 1 tablet (5 mg total) by mouth once a week.    Dispense:  12 tablet    Refill:  3  . ezetimibe (ZETIA) 10 MG tablet    Sig: Take 1 tablet (10 mg total) by mouth daily.    Dispense:  30 tablet    Refill:  11    Trial with once weekly crestor   No orders of the defined types were placed in this encounter.   Patient Instructions  Drop crestor to  once weekly dosing to see if better tolerated.  Start ezetimibe (Zetia) 124mdaily new cholesterol medicine.  Continue metformin XR for now. If not achieving goal or not doing well, we will likely start once weekly injection (ozempic or trulicity).  Return in 6 months for physical/wellness visit.   Follow up plan: Return in about 6 months (around 01/17/2021) for annual exam, prior fasting for blood work, medicare wellness visit.  JaRia BushMD

## 2020-08-29 ENCOUNTER — Telehealth: Payer: Self-pay

## 2020-08-29 NOTE — Chronic Care Management (AMB) (Addendum)
Chronic Care Management Pharmacy Assistant   Name: Betty Sanders  MRN: [id number] DOB: [birth year]  Reason for Encounter: Disease State    Conditions to be addressed/monitored: DMII   Recent office visits:  07/18/2020  Dr.Gutierrez PCP - Drop crestor to once weekly dosing to see if better tolerated. Start ezetimibe (Zetia) 28m daily new cholesterol medicine. Continue metformin XR for now. If not achieving goal or not doing well, we will likely start once weekly injection (ozempic or trulicity). Return in 6 months for physical/wellness visit.   Recent consult visits:  None since last CCM/6 months   Hospital visits:  None in previous 6 months  Medications: Outpatient Encounter Medications as of 08/29/2020  Medication Sig   Blood Glucose Monitoring Suppl (ONETOUCH VERIO IQ SYSTEM) w/Device KIT Check blood sugar twice a day and as directed. Dx E11.9   Cholecalciferol (VITAMIN D3) 25 MCG (1000 UT) CAPS Take 1 capsule (1,000 Units total) by mouth daily.   Coenzyme Q10 (COQ10) 50 MG CAPS Take 1 capsule by mouth in the morning and at bedtime.   ezetimibe (ZETIA) 10 MG tablet Take 1 tablet (10 mg total) by mouth daily.   glimepiride (AMARYL) 1 MG tablet Take 1 tablet (1 mg total) by mouth daily with breakfast.   metFORMIN (GLUCOPHAGE XR) 500 MG 24 hr tablet Take 2 tablets (1,000 mg total) by mouth daily with breakfast AND 1 tablet (500 mg total) at bedtime.   ONETOUCH DELICA LANCETS 305LMISC Check blood sugar twice a day and as directed. Dx E11.9   ONETOUCH VERIO test strip CHECK BLOOD SUGAR TWICE A DAY AND AS DIRECTED. DX E11.9   Probiotic Product (PROBIOTIC PO) Take by mouth daily.   rosuvastatin (CRESTOR) 5 MG tablet Take 1 tablet (5 mg total) by mouth once a week.   vitamin C (ASCORBIC ACID) 500 MG tablet Take 500 mg by mouth as needed.   No facility-administered encounter medications on file as of 08/29/2020.   Recent Relevant Labs: Lab Results  Component Value Date/Time    HGBA1C 8.0 (H) 07/14/2020 07:50 AM   HGBA1C 7.4 (H) 01/16/2020 07:40 AM   MICROALBUR <0.7 01/16/2020 07:40 AM   MICROALBUR <0.7 01/10/2019 08:21 AM    Kidney Function Lab Results  Component Value Date/Time   CREATININE 0.72 07/14/2020 07:50 AM   CREATININE 0.73 01/16/2020 07:40 AM   GFR 83.44 07/14/2020 07:50 AM    Current antihyperglycemic regimen:  Glimepiride 1 mg (Amaryl) - 1 tablet daily Metformin 500 mg XR- 2 tablets daily with breakfast and 1 tablet at bedtime  Patient verbally confirms she is taking the above medications as directed. Yes  What recent interventions/DTPs have been made to improve glycemic control: 07/18/2020  Dr.Gutierrez changed the metformin 1500 mg by splitting to 1000 mg AM and 500 mg PM.The patient reports the addition of a 3rd pill of metformin and it being XR has made a difference with lowering her BGs.  Have there been any recent hospitalizations or ED visits since last visit with CPP? No  Patient denies hypoglycemic symptoms, including Pale, Sweaty, Shaky, Hungry, Nervous/irritable and Vision changes  Patient denies hyperglycemic symptoms, including blurry vision, excessive thirst, fatigue, polyuria and weakness  How often are you checking your blood sugar? in the morning before eating or drinking  What are your blood sugars ranging?  Fasting:  Average 150-165 first thing in morning  Before meals: n/a After meals: n/a Bedtime: n/a  On insulin? No During the week, how often does  your blood glucose drop below 70? Never Are you checking your feet daily/regularly? Yes  Adherence Review: Is the patient currently on a STATIN medication? Yes Is the patient currently on ACE/ARB medication? No Does the patient have >5 day gap between last estimated fill dates? No  Star Rating Drugs:  Medication:  Last Fill: Day Supply Glimepiride 30m 07/18/20  90 Metformin 50720m2/23/22 90 Rosuvastatin 20m33m/1/22  83   Follow-Up:  Pharmacist Review  MicDebbora DusPP notified  VelAvel SensorMThe Physicians Centre Hospitalinical Pharmacy Assistant [phone] have reviewed the care management and care coordination activities outlined in this encounter and I am certifying that I agree with the content of this note. BG are still elevated, schedule CCM visit to discuss treatment options. Her max tolerated metformin dose in 1500 mg/day.  MicDebbora DusharmD Clinical Pharmacist [phone]

## 2020-09-12 NOTE — Chronic Care Management (AMB) (Signed)
Spoke with patient and she confirmed that she is available 09/19/20 at 8:30 for appointment with Sharyn Lull Adams,CPP and the patient will log BG's before breakfast and 2 hours after dinner to have to discuss.  Debbora Dus, CPP notified  Avel Sensor, [place] Assistant [phone]  Total time spent for month CPA: 55 min

## 2020-09-18 ENCOUNTER — Ambulatory Visit (INDEPENDENT_AMBULATORY_CARE_PROVIDER_SITE_OTHER): Payer: PPO

## 2020-09-18 DIAGNOSIS — E118 Type 2 diabetes mellitus with unspecified complications: Secondary | ICD-10-CM | POA: Diagnosis not present

## 2020-09-18 DIAGNOSIS — E785 Hyperlipidemia, unspecified: Secondary | ICD-10-CM

## 2020-09-18 DIAGNOSIS — E1169 Type 2 diabetes mellitus with other specified complication: Secondary | ICD-10-CM

## 2020-09-18 NOTE — Progress Notes (Deleted)
Called to discuss BG readings. Started twice daily   Glimepiride 1 mg (Amaryl) - 1 tablet daily Metformin 500 mg XR- 2 tablets daily with breakfast and 1 tablet at bedtime  She does not eat 3 meals She eats breakfast daily, carrots 10 AM, lunch around 2 PM 2 ER in morning and 1 at night Saturday Fasting 176, 181, 153 (took an addition metformin at supper the night before), 165, 186, 187 (7 AM) After breakfast 160 (11 AM), 206-221 (eggs, 1 piece of toast) Before Lunch 134, 121, 94, 107 Before Dinner 168 Before Bed: 213, 256, 147  Reports ER metformin is much easier on her stomach She has a hard time swallowing pills. She does not mind taking an injection.  Her goal is < 7% She avoids junk food. She exercises - walks 1 mile daily   Increase metformin - 2 tablets twice daily   Cholesterol meds - no concerns Drop crestor to once weekly dosing to see if better tolerated.  Start ezetimibe (Zetia) 10mg  daily new cholesterol medicine.  She has not noticed muscle pain.  Lab Results  Component Value Date/Time   HGBA1C 8.0 (H) 07/14/2020 07:50 AM   HGBA1C 7.4 (H) 01/16/2020 07:40 AM   Lab Results  Component Value Date   CREATININE 0.72 07/14/2020   BUN 12 07/14/2020   GFR 83.44 07/14/2020   NA 136 07/14/2020   K 4.4 07/14/2020   CALCIUM 9.5 07/14/2020   CO2 27 07/14/2020

## 2020-09-18 NOTE — Progress Notes (Signed)
Chronic Care Management Pharmacy Note  09/22/2020 Name:  Betty Sanders MRN:  [id number] DOB:  06/30/1945  Summary: DM - Elevated fasting BG 150-180. HLD - She is tolerating weekly rosuvastatin and Zetia well.  Recommendations/Changes made from today's visit: Increase metformin to 2000 mg/day. Take the extra tablet before supper (Pending PCP approval by telephone note)  Plan: CCM team BG log follow up in 30 days  Subjective: Betty Sanders is an 75 y.o. year old female who is a primary patient of Ria Bush, MD.  The CCM team was consulted for assistance with disease management and care coordination needs.    Engaged with patient by telephone for follow up visit in response to provider referral for pharmacy case management and/or care coordination services.   Consent to Services:  The patient was given information about Chronic Care Management services, agreed to services, and gave verbal consent prior to initiation of services.  Please see initial visit note for detailed documentation.   Patient Care Team: Ria Bush, MD as PCP - General Debbora Dus, [facility] as Pharmacist (Pharmacist)  Recent office visits:  07/18/2020  Dr.Gutierrez PCP - Drop crestor to once weekly dosing to see if better tolerated. Start ezetimibe (Zetia) 77m daily new cholesterol medicine. Continue metformin XR for now. If not achieving goal or not doing well, we will likely start once weekly injection (ozempic or trulicity). Return in 6 months for physical/wellness visit.  Recent consult visits:  None in previous 6 months   Hospital visits:  None in previous 6 months  Objective:  Lab Results  Component Value Date   CREATININE 0.72 07/14/2020   BUN 12 07/14/2020   GFR 83.44 07/14/2020   NA 136 07/14/2020   K 4.4 07/14/2020   CALCIUM 9.5 07/14/2020   CO2 27 07/14/2020   GLUCOSE 225 (H) 07/14/2020    Lab Results  Component Value Date/Time   HGBA1C 8.0 (H) 07/14/2020 07:50  AM   HGBA1C 7.4 (H) 01/16/2020 07:40 AM   GFR 83.44 07/14/2020 07:50 AM   GFR 78.35 01/16/2020 07:40 AM   MICROALBUR <0.7 01/16/2020 07:40 AM   MICROALBUR <0.7 01/10/2019 08:21 AM    Last diabetic Eye exam:  Lab Results  Component Value Date/Time   HMDIABEYEEXA No Retinopathy 10/15/2019 12:00 AM    Last diabetic Foot exam:  07/18/20 normal   Lab Results  Component Value Date   CHOL 217 (H) 07/14/2020   HDL 42.20 07/14/2020   LDLCALC 142 (H) 07/14/2020   LDLDIRECT 69.0 04/28/2016   TRIG 165.0 (H) 07/14/2020   CHOLHDL 5 07/14/2020    Hepatic Function Latest Ref Rng & Units 01/16/2020 07/11/2019 01/10/2019  Total Protein 6.0 - 8.3 g/dL 7.2 7.6 6.9  Albumin 3.5 - 5.2 g/dL 4.3 4.4 4.1  AST 0 - 37 U/L _0 ALT 0 - 35 U/L _1 Alk Phosphatase 39 - 117 U/L 80 94 80  Total Bilirubin 0.2 - 1.2 mg/dL 0.7 0.5 0.5    No results found for: TSH, FREET4  CBC Latest Ref Rng & Units 05/18/2012  WBC 4.5 - 10.5 K/uL 8.6  Hemoglobin 12.0 - 15.0 g/dL 12.1  Hematocrit 36.0 - 46.0 % 35.0(L)  Platelets 150.0 - 400.0 K/uL 331.0    Lab Results  Component Value Date/Time   VD25OH 36.78 07/14/2020 07:50 AM   VD25OH 30.15 09/04/2014 08:54 AM    Clinical ASCVD: No  The 10-year ASCVD risk score (Mikey BussingDC Jr., et al., 2013) is:  20.6%   Values used to calculate the score:     Age: 37 years     Sex: Female     Is Non-Hispanic African American: No     Diabetic: Yes     Tobacco smoker: No     Systolic Blood Pressure: 409 mmHg     Is BP treated: No     HDL Cholesterol: 42.2 mg/dL     Total Cholesterol: 217 mg/dL    Depression screen [facility] 2/9 01/16/2020 01/17/2019 01/10/2018  Decreased Interest 0 0 1  Down, Depressed, Hopeless 0 0 0  PHQ - 2 Score 0 0 1  Altered sleeping 0 - 0  Tired, decreased energy 0 - 0  Change in appetite 0 - 1  Feeling bad or failure about yourself  0 - 0  Trouble concentrating 0 - 0  Moving slowly or fidgety/restless 0 - 0  Suicidal thoughts 0 - 0  PHQ-9 Score  0 - 2  Difficult doing work/chores Not difficult at all - Not difficult at all     Social History   Tobacco Use  Smoking Status Former Smoker  . Quit date: 04/19/1968  . Years since quitting: 52.4  Smokeless Tobacco Never Used   BP Readings from Last 3 Encounters:  07/18/20 122/64  05/21/20 130/62  05/07/20 (!) 124/56   Pulse Readings from Last 3 Encounters:  07/18/20 67  05/21/20 70  05/07/20 60   Wt Readings from Last 3 Encounters:  07/18/20 176 lb 1 oz (79.9 kg)  05/21/20 176 lb (79.8 kg)  05/07/20 177 lb (80.3 kg)   BMI Readings from Last 3 Encounters:  07/18/20 30.22 kg/m  05/21/20 30.21 kg/m  05/07/20 30.38 kg/m    Assessment/Interventions: Review of patient past medical history, allergies, medications, health status, including review of consultants reports, laboratory and other test data, was performed as part of comprehensive evaluation and provision of chronic care management services.   SDOH:  (Social Determinants of Health) assessments and interventions performed: Yes SDOH Interventions   Flowsheet Row Most Recent Value  SDOH Interventions   Financial Strain Interventions Intervention Not Indicated     SDOH Screenings   Alcohol Screen: Low Risk   . Last Alcohol Screening Score (AUDIT): 1  Depression (PHQ2-9): Low Risk   . PHQ-2 Score: 0  Financial Resource Strain: Low Risk   . Difficulty of Paying Living Expenses: Not very hard  Food Insecurity: No Food Insecurity  . Worried About Charity fundraiser in the Last Year: Never true  . Ran Out of Food in the Last Year: Never true  Housing: Low Risk   . Last Housing Risk Score: 0  Physical Activity: Sufficiently Active  . Days of Exercise per Week: 7 days  . Minutes of Exercise per Session: 60 min  Social Connections: Not on file  Stress: No Stress Concern Present  . Feeling of Stress : Not at all  Tobacco Use: Medium Risk  . Smoking Tobacco Use: Former Smoker  . Smokeless Tobacco Use: Never Used   Transportation Needs: No Transportation Needs  . Lack of Transportation (Medical): No  . Lack of Transportation (Non-Medical): No    CCM Care Plan  Allergies  Allergen Reactions  . Lipitor [Atorvastatin] Other (See Comments)    myalgia  . Pravastatin Other (See Comments)  . Sulfa Antibiotics     Possible rxn, unsure.    Medications Reviewed Today    Reviewed by Ria Bush, MD (Physician) on 07/18/20 at [phone]  Med List Status: <None>  Medication Order Taking? Sig Documenting Provider Last Dose Status Informant  Blood Glucose Monitoring Suppl (ONETOUCH VERIO IQ SYSTEM) w/Device KIT [id number] Yes Check blood sugar twice a day and as directed. Dx E11.9 Ria Bush, MD Taking Active   Coenzyme Q10 (COQ10) 50 MG CAPS [id number] Yes Take 1 capsule by mouth in the morning and at bedtime. Ria Bush, MD Taking Active   glimepiride (AMARYL) 1 MG tablet [id number] Yes Take 1 tablet (1 mg total) by mouth daily with breakfast. Ria Bush, MD Taking Active   metFORMIN (GLUCOPHAGE XR) 500 MG 24 hr tablet [id number] Yes Take 3 tablets (1,500 mg total) by mouth daily with breakfast. Ria Bush, MD Taking Active   [place] 16W MISC [id number] Yes Check blood sugar twice a day and as directed. Dx E11.9 Ria Bush, MD Taking Active   [facility] VERIO test strip [id number] Yes CHECK BLOOD SUGAR TWICE A DAY AND AS DIRECTED. DX E11.9 Ria Bush, MD Taking Active   Probiotic Product (PROBIOTIC PO) [id number] Yes Take by mouth daily. [provider] Taking Active Self  rosuvastatin (CRESTOR) 5 MG tablet [id number] Yes Take 1 tablet (5 mg total) by mouth 2 (two) times a week. Ria Bush, MD Taking Active   vitamin C (ASCORBIC ACID) 500 MG tablet [id number] Yes Take 500 mg by mouth as needed. [provider] Taking Active           Patient Active Problem List   Diagnosis Date Noted  . Statin intolerance 07/18/2020  . Medicare  annual wellness visit, subsequent 01/17/2019  . Advanced care planning/counseling discussion 11/01/2016  . Obesity, Class I, BMI 30-34.9 11/01/2016  . Systolic murmur 51/05/5850  . Rash and nonspecific skin eruption 05/12/2016  . NAFLD (nonalcoholic fatty liver disease) 10/31/2015  . IBS (irritable bowel syndrome) 10/31/2015  . Osteopenia 05/20/2014  . Abdominal pain, other specified site 05/18/2012  . DDD (degenerative disc disease), thoracic 05/18/2012  . Hyperlipidemia associated with type 2 diabetes mellitus ([place])   . Healthcare maintenance 02/23/2012  . Controlled diabetes mellitus type 2 with complications ([place]) 77/82/4235  . Personal history of colonic polyps 03/11/2010    Immunization History  Administered Date(s) Administered  . Fluad Quad(high Dose 65+) 01/17/2019  . Influenza Split 02/09/2012  . Influenza Whole 01/17/2013  . Influenza, High Dose Seasonal PF 01/18/2020  . Influenza, Seasonal, Injecte, Preservative Fre 01/13/2015  . Influenza,inj,Quad PF,6+ Mos 01/18/2014, 01/10/2018  . Influenza-Unspecified 01/18/2016, 01/26/2017  . PFIZER(Purple Top)SARS-COV-2 Vaccination 05/26/2019, 06/20/2019, 02/18/2020  . Pneumococcal Conjugate-13 09/12/2014  . Pneumococcal Polysaccharide-23 02/14/2013  . Td 10/02/2007  . Tdap 01/11/2018, 10/13/2018  . Zoster Recombinat (Shingrix) 10/15/2019, 12/27/2019    Conditions to be addressed/monitored:  Hyperlipidemia and Diabetes  Care Plan : [place]  Updates made by Debbora Dus, [place] since 09/22/2020 12:00 AM    Problem: CHL AMB "PATIENT-SPECIFIC PROBLEM"     Long-Range Goal: Disease Management   Start Date: 09/18/2020  Priority: High  Note:   Current Barriers:  . Unable to achieve control of diabetes   Pharmacist Clinical Goal(s):  Marland Kitchen Patient will adhere to plan to optimize therapeutic regimen for diabetes as evidenced by report of adherence to recommended medication management changes through collaboration with  PharmD and provider.   Interventions: . 1:1 collaboration with Ria Bush, MD regarding development and update of comprehensive plan of care as evidenced by provider attestation and co-signature . Inter-disciplinary care team collaboration (see longitudinal plan of care) . Comprehensive  medication review performed; medication list updated in electronic medical record  Hyperlipidemia: (LDL goal < 100) -Unable to assess - Zetia added 07/18/20, will await updated lipid panel  -Current treatment: . Rosuvastatin 5 mg - 1 tablet once weekly . Zetia 10 mg - 1 tablet daily -Medications previously tried: daily rosuvastatin Declines muscle pain since decreasing rosuvastatin to once weekly and starting Zetia -Educated on Cholesterol goals;  -Recommended to continue current medication  Diabetes (A1c goal <7%) -Not ideally controlled - per home BG readings -Current medications:  Glimepiride 1 mg (Amaryl) - 1 tablet daily  Metformin 500 mg XR- 2 tablets daily with breakfast and 1 tablet at bedtime -Medications previously tried: unable to tolerate metformin 2000 mg of IR formulation -Current home glucose readings - all from the past week  Fasting 176, 181, 153 (took an addition metformin at supper the night before), 165, 186, 187 (7 AM) After breakfast 160 (11 AM), 206-221 (eggs, 1 piece of toast) Before Lunch 134, 121, 94, 107 Before Dinner 168 Before Bed: 213, 256, 147 -Denies hypoglycemic/hyperglycemic symptoms -Current meal patterns: She eats breakfast daily, carrots for snack around 10 AM, lunch around 2 PM, no evening meal. Usually just snacks. States she avoid junk food.  -Current exercise: walking 1 mile daily  -Educated on A1c and blood sugar goals; Injectable options - she is fine with an injection as next step if metformin does not get blood sugars to goal. Discussed need for improvement in BG control. She reports ER metformin is much easier on her stomach and okay with trying 2000  mg/day -Counseled to check feet daily and get yearly eye exams -Recommended increase metformin to 2 tablets twice daily with meals  Patient Goals/Self-Care Activities . Patient will:  - check glucose daily, document, and provide at future appointments  Follow Up Plan: Telephone follow up appointment with care management team member scheduled for: July 2022, CCM Team will call in 30 days for BG log        Medication Assistance: None required.  Patient affirms current coverage meets needs.  Compliance/Adherence/Medication fill history: Care Gaps: Pneumonia vaccine due  Star-Rating Drugs: Rosuvastatin 5 mg   07/18/20    83 DS Metformin 500 mg  09/10/20   90 DS Ezetimibe 10 mg     07/18/20  90 DS Glimepiride 1 mg     07/18/20 90 DS  Patient's preferred pharmacy [facility]:  [id number] [place] [id number]: [phone]: [phone] Care Plan and Follow Up Patient Decision:  Patient agrees to Care Plan and Follow-up.  MDebbora Dus PharmD Clinical Pharmacist [phone]

## 2020-09-19 ENCOUNTER — Telehealth: Payer: PPO

## 2020-09-22 ENCOUNTER — Telehealth: Payer: Self-pay

## 2020-09-22 NOTE — Patient Instructions (Signed)
Dear Betty Sanders,  Below is a summary of the goals we discussed during our follow up appointment on September 18, 2020. Please contact me anytime with questions or concerns.   Visit Information  Patient Care Plan: CCM Pharmacy Care Plan    Problem Identified: CHL AMB "PATIENT-SPECIFIC PROBLEM"     Long-Range Goal: Disease Management   Start Date: 09/18/2020  Priority: High  Note:   Current Barriers:  . Unable to achieve control of diabetes   Pharmacist Clinical Goal(s):  Marland Kitchen Patient will adhere to plan to optimize therapeutic regimen for diabetes as evidenced by report of adherence to recommended medication management changes through collaboration with PharmD and provider.   Interventions: . 1:1 collaboration with Ria Bush, MD regarding development and update of comprehensive plan of care as evidenced by provider attestation and co-signature . Inter-disciplinary care team collaboration (see longitudinal plan of care) . Comprehensive medication review performed; medication list updated in electronic medical record  Hyperlipidemia: (LDL goal < 100) -Unable to assess - Zetia added 07/18/20, will await updated lipid panel  -Current treatment: . Rosuvastatin 5 mg - 1 tablet once weekly . Zetia 10 mg - 1 tablet daily -Medications previously tried: daily rosuvastatin Declines muscle pain since decreasing rosuvastatin to once weekly and starting Zetia -Educated on Cholesterol goals;  -Recommended to continue current medication  Diabetes (A1c goal <7%) -Not ideally controlled - per home BG readings -Current medications:  Glimepiride 1 mg (Amaryl) - 1 tablet daily  Metformin 500 mg XR- 2 tablets daily with breakfast and 1 tablet at bedtime -Medications previously tried: unable to tolerate metformin 2000 mg of IR formulation -Current home glucose readings - all from the past week  Fasting 176, 181, 153 (took an addition metformin at supper the night before), 165, 186, 187 (7  AM) After breakfast 160 (11 AM), 206-221 (eggs, 1 piece of toast) Before Lunch 134, 121, 94, 107 Before Dinner 168 Before Bed: 213, 256, 147 -Denies hypoglycemic/hyperglycemic symptoms -Current meal patterns: She eats breakfast daily, carrots for snack around 10 AM, lunch around 2 PM, no evening meal. Usually just snacks. States she avoid junk food.  -Current exercise: walking 1 mile daily  -Educated on A1c and blood sugar goals; Injectable options - she is fine with an injection as next step if metformin does not get blood sugars to goal. Discussed need for improvement in BG control. She reports ER metformin is much easier on her stomach and okay with trying 2000 mg/day -Counseled to check feet daily and get yearly eye exams -Recommended increase metformin to 2 tablets twice daily with meals  Patient Goals/Self-Care Activities . Patient will:  - check glucose daily, document, and provide at future appointments  Follow Up Plan: Telephone follow up appointment with care management team member scheduled for: July 2022, CCM Team will call in 30 days for BG log       The patient verbalized understanding of instructions, educational materials, and care plan provided today and agreed to receive a mailed copy of patient instructions, educational materials, and care plan.   Debbora Dus, PharmD Clinical Pharmacist [facility] at [facility] [phone]   Blood Glucose Monitoring, Adult Monitoring your blood sugar (glucose) is an important part of managing your diabetes. Blood glucose monitoring involves checking your blood glucose as often as directed and keeping a log or record of your results over time. Checking your blood glucose regularly and keeping a blood glucose log can:  Help you and your health care  provider adjust your diabetes management plan as needed, including your medicines or insulin.  Help you understand how food, exercise, illnesses, and medicines affect your  blood glucose.  Let you know what your blood glucose is at any time. You can quickly find out if you have low blood glucose (hypoglycemia) or high blood glucose (hyperglycemia). Your health care provider will set individualized treatment goals for you. Your goals will be based on your age, other medical conditions you have, and how you respond to diabetes treatment. Generally, the goal of treatment is to maintain the following blood glucose levels:  Before meals (preprandial): 80-130 mg/dL (4.4-7.2 mmol/L).  After meals (postprandial): below 180 mg/dL (10 mmol/L).  A1C level: less than 7%. Supplies needed:  Blood glucose meter.  Test strips for your meter. Each meter has its own strips. You must use the strips that came with your meter.  A needle to prick your finger (lancet). Do not use a lancet more than one time.  A device that holds the lancet (lancing device).  A journal or log book to write down your results. How to check your blood glucose Checking your blood glucose 1. Wash your hands for at least 20 seconds with soap and water. 2. Prick the side of your finger (not the tip) with the lancet. Do not use the same finger consecutively. 3. Gently rub the finger until a small drop of blood appears. 4. Follow instructions that come with your meter for inserting the test strip, applying blood to the strip, and using your blood glucose meter. 5. Write down your result and any notes in your log.   Using alternative sites Some meters allow you to use areas of your body other than your finger (alternative sites) to test your blood. The most common alternative sites are the forearm, the thigh, and the palm of your hand. Alternative sites may not be as accurate as the fingers because blood flow is slower in those areas. This means that the result you get may be delayed, and it may be different from the result that you would get from your finger. Use the finger only, and do not use  alternative sites, if:  You think you have hypoglycemia.  You sometimes do not know that your blood glucose is getting low (hypoglycemia unawareness). General tips and recommendations Blood glucose log  Every time you check your blood glucose, write down your result. Also write down any notes about things that may be affecting your blood glucose, such as your diet and exercise for the day. This information can help you and your health care provider: ? Look for patterns in your blood glucose over time. ? Adjust your diabetes management plan as needed.  Check if your meter allows you to download your records to a computer or if there is an app for the meter. Most glucose meters store a record of glucose readings in the meter.   If you have type 1 diabetes:  Check your blood glucose 4 or more times a day if you are on intensive insulin therapy with multiple daily injections (MDI) or if you are using an insulin pump. Check your blood glucose: ? Before every meal and snack. ? Before bedtime.  Also check your blood glucose: ? If you have symptoms of hypoglycemia. ? After treating low blood glucose. ? Before doing activities that create a risk for injury, like driving or using machinery. ? Before and after exercise. ? Two hours after a meal. ? Occasionally  between 2:00 a.m. and 3:00 a.m., as directed.  You may need to check your blood glucose more often, 6-10 times per day, if: ? You have diabetes that is not well controlled. ? You are ill. ? You have a history of severe hypoglycemia. ? You have hypoglycemia unawareness. If you have type 2 diabetes:  Check your blood glucose 2 or more times a day if you take insulin or other diabetes medicines.  Check your blood glucose 4 or more times a day if you are on intensive insulin therapy. Occasionally, you may also need to check your glucose between 2:00 a.m. and 3:00 a.m., as directed.  Also check your blood glucose: ? Before and after  exercise. ? Before doing activities that create a risk for injury, like driving or using machinery.  You may need to check your blood glucose more often if: ? Your medicine is being adjusted. ? Your diabetes is not well controlled. ? You are ill. General tips  Make sure you always have your supplies with you.  After you use a few boxes of test strips, adjust (calibrate) your blood glucose meter by following instructions that came with your meter.  If you have questions or need help, all blood glucose meters have a 24-hour hotline phone number available that you can call. Also contact your health care provider with questions or concerns you may have. Where to find more information  The American Diabetes Association: www.diabetes.org  The Association of Diabetes Care & Education Specialists: www.diabeteseducator.org Contact a health care provider if:  Your blood glucose is at or above 240 mg/dL (13.3 mmol/L) for 2 days in a row.  You have been sick or have had a fever for 2 days or longer, and you are not getting better.  You have any of the following problems for more than 6 hours: ? You cannot eat or drink. ? You have nausea or vomiting. ? You have diarrhea. Get help right away if:  Your blood glucose is lower than 54 mg/dL (3 mmol/L).  You become confused, or you have trouble thinking clearly.  You have difficulty breathing.  You have moderate or large ketone levels in your urine. These symptoms may represent a serious problem that is an emergency. Do not wait to see if the symptoms will go away. Get medical help right away. Call your local emergency services (911 in the [place] Do not drive yourself to the hospital. Summary  Monitoring your blood glucose is an important part of managing your diabetes.  Blood glucose monitoring involves checking your blood glucose as often as directed and keeping a log or record of your results over time.  Your health care provider will  set individualized treatment goals for you. Your goals will be based on your age, other medical conditions you have, and how you respond to diabetes treatment.  Every time you check your blood glucose, write down your result. Also, write down any notes about things that may be affecting your blood glucose, such as your diet and exercise for the day. This information is not intended to replace advice given to you by your health care provider. Make sure you discuss any questions you have with your health care provider. Document Revised: 01/02/2020 Document Reviewed: 01/02/2020 Elsevier Patient Education  2021 [employer].

## 2020-09-22 NOTE — Telephone Encounter (Signed)
PCP Consult:  Summary: DM - Elevated fasting BG 150-180  Recommendations: Increase metformin to 2000 mg/day. Take the extra tablet before supper.  Plan: CCM team BG log follow up in 30 days Pt to resume usual dose if any GI upset Pt has enough metformin and does not need a new prescription to the pharmacy at this time.  Lab Results  Component Value Date   CREATININE 0.72 07/14/2020   BUN 12 07/14/2020   GFR 83.44 07/14/2020   NA 136 07/14/2020   K 4.4 07/14/2020   CALCIUM 9.5 07/14/2020   CO2 27 07/14/2020

## 2020-09-22 NOTE — Telephone Encounter (Signed)
Agree with this.  Update [place] with effect in 1 month.

## 2020-09-23 ENCOUNTER — Telehealth: Payer: Self-pay

## 2020-09-23 DIAGNOSIS — E118 Type 2 diabetes mellitus with unspecified complications: Secondary | ICD-10-CM

## 2020-09-23 NOTE — Telephone Encounter (Signed)
Pt request new prescription for One Touch Verio Test strips to [facility] [place].  Debbora Dus, PharmD Clinical Pharmacist [facility] at [facility] [phone]

## 2020-09-24 MED ORDER — ONETOUCH VERIO VI STRP: ORAL_STRIP | 3 refills | Status: DC

## 2020-09-24 NOTE — Telephone Encounter (Signed)
E-scribed refill 

## 2020-11-04 ENCOUNTER — Telehealth: Payer: Self-pay

## 2020-11-04 NOTE — Chronic Care Management (AMB) (Addendum)
    Chronic Care Management Pharmacy Assistant   Name: Betty Sanders  MRN: [id number] DOB: 04-Jun-1945 .  Reason for Encounter: Reminder Call   Conditions to be addressed/monitored: HLD and DMII   Medications: Outpatient Encounter Medications as of 11/04/2020  Medication Sig   Blood Glucose Monitoring Suppl (ONETOUCH VERIO IQ SYSTEM) w/Device KIT Check blood sugar twice a day and as directed. Dx E11.9   Cholecalciferol (VITAMIN D3) 25 MCG (1000 UT) CAPS Take 1 capsule (1,000 Units total) by mouth daily.   Coenzyme Q10 (COQ10) 50 MG CAPS Take 1 capsule by mouth in the morning and at bedtime.   ezetimibe (ZETIA) 10 MG tablet Take 1 tablet (10 mg total) by mouth daily.   glimepiride (AMARYL) 1 MG tablet Take 1 tablet (1 mg total) by mouth daily with breakfast.   glucose blood (ONETOUCH VERIO) test strip Check blood sugar twice a day.   metFORMIN (GLUCOPHAGE XR) 500 MG 24 hr tablet Take 2 tablets (1,000 mg total) by mouth in the morning and at bedtime.   ONETOUCH DELICA LANCETS 40J MISC Check blood sugar twice a day and as directed. Dx E11.9   Probiotic Product (PROBIOTIC PO) Take by mouth daily.   rosuvastatin (CRESTOR) 5 MG tablet Take 1 tablet (5 mg total) by mouth once a week.   vitamin C (ASCORBIC ACID) 500 MG tablet Take 500 mg by mouth as needed.   No facility-administered encounter medications on file as of 11/04/2020.   [name] was contacted to remind her of her upcoming telephone visit with Debbora Dus on 11/11/2020 at 9:30am. Patient was reminded to have all medications, supplements and any blood glucose and blood pressure readings available for review at appointment.   Are you having any problems with your medications? No  Do you have any concerns you like to discuss with the pharmacist? No no primary concern at this time    Star Rating Drugs: Medication:  Last Fill: Day Supply Metformin XR $RemoveBefo'500mg'xOSjJHifAEC$   09/10/20 90 Rosuvastatin $RemoveBeforeD'5mg'htyacOlgfswJgJ$  10/05/20 83 Glimepiride  $RemoveBefo'1mg'XBLTjJEJEBv$  10/05/20 Novinger, CPP notified  Avel Sensor, [place] Assistant [phone]  I have reviewed the care management and care coordination activities outlined in this encounter and I am certifying that I agree with the content of this note. No further action required.  Debbora Dus, PharmD Clinical Pharmacist [facility] at [facility] [phone]

## 2020-11-11 ENCOUNTER — Other Ambulatory Visit: Payer: Self-pay

## 2020-11-11 ENCOUNTER — Ambulatory Visit (INDEPENDENT_AMBULATORY_CARE_PROVIDER_SITE_OTHER): Payer: PPO

## 2020-11-11 DIAGNOSIS — E118 Type 2 diabetes mellitus with unspecified complications: Secondary | ICD-10-CM | POA: Diagnosis not present

## 2020-11-11 DIAGNOSIS — E1169 Type 2 diabetes mellitus with other specified complication: Secondary | ICD-10-CM | POA: Diagnosis not present

## 2020-11-11 DIAGNOSIS — E785 Hyperlipidemia, unspecified: Secondary | ICD-10-CM | POA: Diagnosis not present

## 2020-11-11 NOTE — Progress Notes (Signed)
Chronic Care Management Pharmacy Note  11/11/2020 Name:  Betty Sanders MRN:  [id number] DOB:  January 16, 1946  Summary: DM - Tolerating increase of metformin to 2000 mg/day well. Fasting BG 160 on average. Before meals average 126. After meals and bedtime average 180. She has noticed some improvement with higher dose metformin but fasting BG remains elevated. HLD - She had to stop once weekly rosuvastatin due to debilitating muscle pain. This is slowly resolving. Stopped about 30 days ago. She continues Zetia daily.  Recommendations/Changes made from today's visit: None - pt would like to discuss Trulicity with PCP at next visit   Plan: Next follow up - PCP visit 01/19/2021 CCM contact for BG log 02/2021  Subjective: Betty Sanders is an 75 y.o. year old female who is a primary patient of Ria Bush, MD.  The CCM team was consulted for assistance with disease management and care coordination needs.    Engaged with patient by telephone for follow up visit in response to provider referral for pharmacy case management and/or care coordination services. She started a new hobby - stained glass.   Consent to Services:  The patient was given information about Chronic Care Management services, agreed to services, and gave verbal consent prior to initiation of services.  Please see initial visit note for detailed documentation.   Patient Care Team: Ria Bush, MD as PCP - General Debbora Dus, [facility] as Pharmacist (Pharmacist)  Recent office visits:  07/18/2020  Dr.Gutierrez, PCP - Drop crestor to once weekly dosing to see if better tolerated. Start ezetimibe (Zetia) 18m daily new cholesterol medicine. Continue metformin XR for now. If not achieving goal or not doing well, we will likely start once weekly injection (ozempic or trulicity). Return in 6 months for physical/wellness visit.    Recent consult visits:  None in previous 6 months    Hospital visits:  None in previous  6 months  Objective:  Lab Results  Component Value Date   CREATININE 0.72 07/14/2020   BUN 12 07/14/2020   GFR 83.44 07/14/2020   NA 136 07/14/2020   K 4.4 07/14/2020   CALCIUM 9.5 07/14/2020   CO2 27 07/14/2020   GLUCOSE 225 (H) 07/14/2020    Lab Results  Component Value Date/Time   HGBA1C 8.0 (H) 07/14/2020 07:50 AM   HGBA1C 7.4 (H) 01/16/2020 07:40 AM   GFR 83.44 07/14/2020 07:50 AM   GFR 78.35 01/16/2020 07:40 AM   MICROALBUR <0.7 01/16/2020 07:40 AM   MICROALBUR <0.7 01/10/2019 08:21 AM    Last diabetic Eye exam:  Lab Results  Component Value Date/Time   HMDIABEYEEXA No Retinopathy 10/15/2019 12:00 AM    Last diabetic Foot exam:  07/18/20 normal   Lab Results  Component Value Date   CHOL 217 (H) 07/14/2020   HDL 42.20 07/14/2020   LDLCALC 142 (H) 07/14/2020   LDLDIRECT 69.0 04/28/2016   TRIG 165.0 (H) 07/14/2020   CHOLHDL 5 07/14/2020    Hepatic Function Latest Ref Rng & Units 01/16/2020 07/11/2019 01/10/2019  Total Protein 6.0 - 8.3 g/dL 7.2 7.6 6.9  Albumin 3.5 - 5.2 g/dL 4.3 4.4 4.1  AST 0 - 37 U/L '29 15 16  ' ALT 0 - 35 U/L '31 12 13  ' Alk Phosphatase 39 - 117 U/L 80 94 80  Total Bilirubin 0.2 - 1.2 mg/dL 0.7 0.5 0.5    No results found for: TSH, FREET4  CBC Latest Ref Rng & Units 05/18/2012  WBC 4.5 - 10.5 K/uL 8.6  Hemoglobin 12.0 - 15.0 g/dL 12.1  Hematocrit 36.0 - 46.0 % 35.0(L)  Platelets 150.0 - 400.0 K/uL 331.0    Lab Results  Component Value Date/Time   VD25OH 36.78 07/14/2020 07:50 AM   VD25OH 30.15 09/04/2014 08:54 AM    Clinical ASCVD: No  The 10-year ASCVD risk score Mikey Bussing DC Jr., et al., 2013) is: 20.6%   Values used to calculate the score:     Age: 66 years     Sex: Female     Is Non-Hispanic African American: No     Diabetic: Yes     Tobacco smoker: No     Systolic Blood Pressure: 110 mmHg     Is BP treated: No     HDL Cholesterol: 42.2 mg/dL     Total Cholesterol: 217 mg/dL    Depression screen [facility] 2/9 01/16/2020 01/17/2019  01/10/2018  Decreased Interest 0 0 1  Down, Depressed, Hopeless 0 0 0  PHQ - 2 Score 0 0 1  Altered sleeping 0 - 0  Tired, decreased energy 0 - 0  Change in appetite 0 - 1  Feeling bad or failure about yourself  0 - 0  Trouble concentrating 0 - 0  Moving slowly or fidgety/restless 0 - 0  Suicidal thoughts 0 - 0  PHQ-9 Score 0 - 2  Difficult doing work/chores Not difficult at all - Not difficult at all     Social History   Tobacco Use  Smoking Status Former   Types: Cigarettes   Quit date: 04/19/1968   Years since quitting: 52.6  Smokeless Tobacco Never   BP Readings from Last 3 Encounters:  07/18/20 122/64  05/21/20 130/62  05/07/20 (!) 124/56   Pulse Readings from Last 3 Encounters:  07/18/20 67  05/21/20 70  05/07/20 60   Wt Readings from Last 3 Encounters:  07/18/20 176 lb 1 oz (79.9 kg)  05/21/20 176 lb (79.8 kg)  05/07/20 177 lb (80.3 kg)   BMI Readings from Last 3 Encounters:  07/18/20 30.22 kg/m  05/21/20 30.21 kg/m  05/07/20 30.38 kg/m    Assessment/Interventions: Review of patient past medical history, allergies, medications, health status, including review of consultants reports, laboratory and other test data, was performed as part of comprehensive evaluation and provision of chronic care management services.   SDOH:  (Social Determinants of Health) assessments and interventions performed: Yes   SDOH Screenings   Alcohol Screen: Low Risk    Last Alcohol Screening Score (AUDIT): 1  Depression (PHQ2-9): Low Risk    PHQ-2 Score: 0  Financial Resource Strain: Low Risk    Difficulty of Paying Living Expenses: Not very hard  Food Insecurity: No Food Insecurity   Worried About Charity fundraiser in the Last Year: Never true   Ran Out of Food in the Last Year: Never true  Housing: Low Risk    Last Housing Risk Score: 0  Physical Activity: Sufficiently Active   Days of Exercise per Week: 7 days   Minutes of Exercise per Session: 60 min  Social  Connections: Not on file  Stress: No Stress Concern Present   Feeling of Stress : Not at all  Tobacco Use: Medium Risk   Smoking Tobacco Use: Former   Smokeless Tobacco Use: Never  Transportation Needs: No Data processing manager (Medical): No   Lack of Transportation (Non-Medical): No    CCM Care Plan  Allergies  Allergen Reactions   Lipitor [Atorvastatin] Other (See Comments)  myalgia   Pravastatin Other (See Comments)   Sulfa Antibiotics     Possible rxn, unsure.    Medications Reviewed Today     Reviewed by Debbora Dus, [facility] (Pharmacist) on 09/22/20 at 1628  Med List Status: <None>   Medication Order Taking? Sig Documenting Provider Last Dose Status Informant  Blood Glucose Monitoring Suppl (ONETOUCH VERIO IQ SYSTEM) w/Device KIT [id number]  Check blood sugar twice a day and as directed. Dx E11.9 Ria Bush, MD  Active   Cholecalciferol (VITAMIN D3) 25 MCG (1000 UT) CAPS [id number]  Take 1 capsule (1,000 Units total) by mouth daily. Ria Bush, MD  Active   Coenzyme Q10 (COQ10) 50 MG CAPS [id number]  Take 1 capsule by mouth in the morning and at bedtime. Ria Bush, MD  Active   ezetimibe (ZETIA) 10 MG tablet [id number] Yes Take 1 tablet (10 mg total) by mouth daily. Ria Bush, MD Taking Active   glimepiride (AMARYL) 1 MG tablet [id number] Yes Take 1 tablet (1 mg total) by mouth daily with breakfast. Ria Bush, MD Taking Active   metFORMIN (GLUCOPHAGE XR) 500 MG 24 hr tablet [id number] Yes Take 2 tablets (1,000 mg total) by mouth daily with breakfast AND 1 tablet (500 mg total) at bedtime. Ria Bush, MD Taking Active   [facility] LANCETS 84Y MISC [id number]  Check blood sugar twice a day and as directed. Dx E11.9 Ria Bush, MD  Active   [facility] VERIO test strip [id number]  CHECK BLOOD SUGAR TWICE A DAY AND AS DIRECTED. DX E11.9 Ria Bush, MD  Active   Probiotic Product (PROBIOTIC PO) [id number]   Take by mouth daily. [provider]  Active Self  rosuvastatin (CRESTOR) 5 MG tablet [id number] Yes Take 1 tablet (5 mg total) by mouth once a week. Ria Bush, MD Taking Active   vitamin C (ASCORBIC ACID) 500 MG tablet [id number]  Take 500 mg by mouth as needed. [provider]  Active             Patient Active Problem List   Diagnosis Date Noted   Statin intolerance 07/18/2020   Medicare annual wellness visit, subsequent 01/17/2019   Advanced care planning/counseling discussion 11/01/2016   Obesity, Class I, BMI 30-34.9 34/28/7681   Systolic murmur 15/72/6203   Rash and nonspecific skin eruption 05/12/2016   NAFLD (nonalcoholic fatty liver disease) 10/31/2015   IBS (irritable bowel syndrome) 10/31/2015   Osteopenia 05/20/2014   Abdominal pain, other specified site 05/18/2012   DDD (degenerative disc disease), thoracic 05/18/2012   Hyperlipidemia associated with type 2 diabetes mellitus ([place])    Healthcare maintenance 02/23/2012   Controlled diabetes mellitus type 2 with complications ([place]) 55/97/4163   Personal history of colonic polyps 03/11/2010    Immunization History  Administered Date(s) Administered   Fluad Quad(high Dose 65+) 01/17/2019   Influenza Split 02/09/2012   Influenza Whole 01/17/2013   Influenza, High Dose Seasonal PF 01/18/2020   Influenza, Seasonal, Injecte, Preservative Fre 01/13/2015   Influenza,inj,Quad PF,6+ Mos 01/18/2014, 01/10/2018   Influenza-Unspecified 01/18/2016, 01/26/2017   PFIZER Comirnaty(Gray Top)Covid-19 Tri-Sucrose Vaccine 10/23/2020   PFIZER(Purple Top)SARS-COV-2 Vaccination 05/26/2019, 06/20/2019, 02/18/2020   Pneumococcal Conjugate-13 09/12/2014   Pneumococcal Polysaccharide-23 02/14/2013   Td 10/02/2007   Tdap 01/11/2018, 10/13/2018   Zoster Recombinat (Shingrix) 10/15/2019, 12/27/2019    Conditions to be addressed/monitored:  Hyperlipidemia and Diabetes  Care Plan : [place]  Updates  made by Debbora Dus, [place] since 11/11/2020 12:00 AM     Problem: CHL  AMB "PATIENT-SPECIFIC PROBLEM"      Long-Range Goal: Disease Management   Start Date: 09/18/2020  Priority: High  Note:   Current Barriers:  Unable to achieve control of diabetes - improving  Pharmacist Clinical Goal(s):  Patient will adhere to plan to optimize therapeutic regimen for diabetes as evidenced by report of adherence to recommended medication management changes through collaboration with PharmD and provider.   Interventions: 1:1 collaboration with Ria Bush, MD regarding development and update of comprehensive plan of care as evidenced by provider attestation and co-signature Inter-disciplinary care team collaboration (see longitudinal plan of care) Comprehensive medication review performed; medication list updated in electronic medical record  Hyperlipidemia: (LDL goal < 100) -Unable to assess - Multiple med changes since last lipid panel. Will updated lipid panel in Sept. 2022. -She stopped her rosuvastatin about 1 month ago due to debilitating muscle pain. The pain is starting to resolve off statin. She continues Zetia since 07/18/20. -Current treatment: Zetia 10 mg - 1 tablet daily -Medications previously tried:  daily and once weekly rosuvastatin  -Recommended to continue Zetia daily. Will update med list due to side effect to low dose statin.  Diabetes (A1c goal <7%) -Not ideally controlled - per home BG readings, but improving on higher dose metformin.  She has been a little heavier on the carbs lately and more stressed. She denies any upset stomach with higher dose metformin. -Current medications: Glimepiride 1 mg (Amaryl) - 1 tablet daily Metformin 500 mg XR- 2 tablets daily with breakfast and 2 tablets at bedtime -Medications previously tried: none -Current home glucose readings - 60 day average, checks every day  Fasting - average 166 (low - 110, high 209) Before Meals - Average 126  (low 79, high 197) After Meals - Average 181 (low 151 - high 221) Before bed - Average 186 (low - 130, high 256) -Denies hypoglycemic/hyperglycemic symptoms -Current meal patterns: She eats breakfast daily, carrots for snack around 10 AM, lunch around 2 PM, no evening meal. Usually just snacks. States she avoids junk food. -Current exercise: walking 1 mile daily  -Patient would like to hold off on Trulicity and consider at next PCP appt - Oct 2022. -Recommended continue current medications for now.  Patient Goals/Self-Care Activities Patient will:  - check glucose daily, document, and provide at future appointments - continue rest, exercise and other activities that reduce stress  Follow Up Plan: PCP visit - 01/2021  Will have CMA reach out for BG log Nov 2022.    Medication Assistance: None required.  Patient affirms current coverage meets needs.  Compliance/Adherence/Medication fill history: Care Gaps: Pneumonia vaccine due  Star-Rating Drugs: Rosuvastatin 5 mg   10/05/20    83 DS Metformin 500 mg    09/10/20   90 DS Ezetimibe 10 mg      10/05/20  90 DS Glimepiride 1 mg      10/05/20  90 DS  No gaps in adherence   Patient's preferred pharmacy is:  [id number] [place] [id number]: [phone]: [phone] Care Plan and Follow Up Patient Decision:  Patient agrees to Care Plan and Follow-up.  MDebbora Dus PharmD Clinical Pharmacist [phone]

## 2020-11-11 NOTE — Patient Instructions (Signed)
Dear Betty Sanders,  Below is a summary of the goals we discussed during our follow up appointment on November 11, 2020. Please contact me anytime with questions or concerns.   Visit Information  Patient Care Plan: CCM Pharmacy Care Plan     Problem Identified: CHL AMB "PATIENT-SPECIFIC PROBLEM"      Long-Range Goal: Disease Management   Start Date: 09/18/2020  Priority: High  Note:   Current Barriers:  Unable to achieve control of diabetes - improving  Pharmacist Clinical Goal(s):  Patient will adhere to plan to optimize therapeutic regimen for diabetes as evidenced by report of adherence to recommended medication management changes through collaboration with PharmD and provider.   Interventions: 1:1 collaboration with Ria Bush, MD regarding development and update of comprehensive plan of care as evidenced by provider attestation and co-signature Inter-disciplinary care team collaboration (see longitudinal plan of care) Comprehensive medication review performed; medication list updated in electronic medical record  Hyperlipidemia: (LDL goal < 100) -Unable to assess - Multiple med changes since last lipid panel. Will updated lipid panel in Sept. 2022. -She stopped her rosuvastatin about 1 month ago due to debilitating muscle pain. The pain is starting to resolve off statin. She continues Zetia since 07/18/20. -Current treatment: Zetia 10 mg - 1 tablet daily -Medications previously tried:  daily and once weekly rosuvastatin  -Recommended to continue Zetia daily. Will update med list due to side effect to low dose statin.  Diabetes (A1c goal <7%) -Not ideally controlled - per home BG readings, but improving on higher dose metformin.  She has been a little heavier on the carbs lately and more stressed. She denies any upset stomach with higher dose metformin. -Current medications: Glimepiride 1 mg (Amaryl) - 1 tablet daily Metformin 500 mg XR- 2 tablets daily with breakfast  and 2 tablets at bedtime -Medications previously tried: none -Current home glucose readings - 60 day average, checks every day  Fasting - average 166 (low - 110, high 209) Before Meals - Average 126 (low 79, high 197) After Meals - Average 181 (low 151 - high 221) Before bed - Average 186 (low - 130, high 256) -Denies hypoglycemic/hyperglycemic symptoms -Current meal patterns: She eats breakfast daily, carrots for snack around 10 AM, lunch around 2 PM, no evening meal. Usually just snacks. States she avoids junk food. -Current exercise: walking 1 mile daily  -Patient would like to hold off on Trulicity and consider at next PCP appt - Oct 2022. -Recommended continue current medications for now.  Patient Goals/Self-Care Activities Patient will:  - check glucose daily, document, and provide at future appointments - continue rest, exercise and other activities that reduce stress  Follow Up Plan: PCP visit - 01/2021  Will have CMA reach out for BG log Nov 2022.      Patient verbalizes understanding of instructions provided today and agrees to view in [place].   Debbora Dus, PharmD Clinical Pharmacist [place] [facility] at [facility] [phone]

## 2020-12-29 DIAGNOSIS — Z85828 Personal history of other malignant neoplasm of skin: Secondary | ICD-10-CM | POA: Diagnosis not present

## 2020-12-29 DIAGNOSIS — I8391 Asymptomatic varicose veins of right lower extremity: Secondary | ICD-10-CM | POA: Diagnosis not present

## 2020-12-29 DIAGNOSIS — D485 Neoplasm of uncertain behavior of skin: Secondary | ICD-10-CM | POA: Diagnosis not present

## 2020-12-29 DIAGNOSIS — L821 Other seborrheic keratosis: Secondary | ICD-10-CM | POA: Diagnosis not present

## 2020-12-29 DIAGNOSIS — C4442 Squamous cell carcinoma of skin of scalp and neck: Secondary | ICD-10-CM | POA: Diagnosis not present

## 2020-12-29 DIAGNOSIS — L308 Other specified dermatitis: Secondary | ICD-10-CM | POA: Diagnosis not present

## 2020-12-29 DIAGNOSIS — I8392 Asymptomatic varicose veins of left lower extremity: Secondary | ICD-10-CM | POA: Diagnosis not present

## 2021-01-01 ENCOUNTER — Other Ambulatory Visit: Payer: Self-pay | Admitting: Family Medicine

## 2021-01-08 ENCOUNTER — Telehealth: Payer: Self-pay

## 2021-01-08 NOTE — Progress Notes (Addendum)
Chronic Care Management Pharmacy Assistant   Name: Betty Sanders  MRN: [id number] DOB: 13-Mar-1946  Reason for Encounter: Diabetes Disease State   Recent office visits:  None since last CCM contact  Recent consult visits:  None since last CCM contact  Hospital visits:  None in previous 6 months  Medications: Outpatient Encounter Medications as of 01/08/2021  Medication Sig   Blood Glucose Monitoring Suppl (ONETOUCH VERIO IQ SYSTEM) w/Device KIT Check blood sugar twice a day and as directed. Dx E11.9   Cholecalciferol (VITAMIN D3) 25 MCG (1000 UT) CAPS Take 1 capsule (1,000 Units total) by mouth daily.   Coenzyme Q10 (COQ10) 50 MG CAPS Take 1 capsule by mouth in the morning and at bedtime.   ezetimibe (ZETIA) 10 MG tablet Take 1 tablet (10 mg total) by mouth daily.   glimepiride (AMARYL) 1 MG tablet TAKE 1 TABLET (1 MG TOTAL) BY MOUTH DAILY WITH BREAKFAST.   glucose blood (ONETOUCH VERIO) test strip Check blood sugar twice a day.   metFORMIN (GLUCOPHAGE-XR) 500 MG 24 hr tablet Take 2 tablets (1,000 mg total) by mouth in the morning and at bedtime.   ONETOUCH DELICA LANCETS 32P MISC Check blood sugar twice a day and as directed. Dx E11.9   Probiotic Product (PROBIOTIC PO) Take by mouth daily.   vitamin C (ASCORBIC ACID) 500 MG tablet Take 500 mg by mouth as needed.   No facility-administered encounter medications on file as of 01/08/2021.    Recent Relevant Labs: Lab Results  Component Value Date/Time   HGBA1C 8.0 (H) 07/14/2020 07:50 AM   HGBA1C 7.4 (H) 01/16/2020 07:40 AM   MICROALBUR <0.7 01/16/2020 07:40 AM   MICROALBUR <0.7 01/10/2019 08:21 AM    Kidney Function Lab Results  Component Value Date/Time   CREATININE 0.72 07/14/2020 07:50 AM   CREATININE 0.73 01/16/2020 07:40 AM   GFR 83.44 07/14/2020 07:50 AM   Contacted patient on 01/08/2021 to discuss diabetes disease state.   Current antihyperglycemic regimen:  Metformin 500 - 2 tablets with breakfast and  evening meal Glimepiride 1 mg- 1 tablet with breakfast  Patient verbally confirms she is taking the above medications as directed. Yes - (Patient states she has increased again with her Metformin. Patient affirms now taking 2 in the morning and 2 in the evening. She is tolerating it well)  What diet changes have been made to improve diabetes control? Patient is watching what she eats and trying to limit sugar.   What recent interventions/DTPs have been made to improve glycemic control:  Patient states she has increased again with her Metformin. Patient is now taking 2 in the morning and 2 in the evening. She is tolerating it well.   Have there been any recent hospitalizations or ED visits since last visit with CPP? No  Patient denies hypoglycemic symptoms, including Pale, Sweaty, Shaky, Hungry, Nervous/irritable, and Vision changes  Patient denies hyperglycemic symptoms, including blurry vision, excessive thirst, fatigue, polyuria, and weakness  How often are you checking your blood sugar? once daily and in the morning before eating or drinking  What are your blood sugars ranging?  Fasting: Patient was camping so she did not have her log with her, but stated it ranges from 170-180. She will take it in the morning before she eats. Sometimes she walks first before taking it.   During the week, how often does your blood glucose drop below 70? Never  Are you checking your feet daily/regularly? Yes  Adherence Review: Is  the patient currently on a STATIN medication? Yes Is the patient currently on ACE/ARB medication? No Does the patient have >5 day gap between last estimated fill dates? No  Care Gaps: Annual wellness visit in last year? No 01/17/2019 Most recent A1C reading: 8.0 07/14/2020 Most Recent BP reading: 122/64 on 07/18/2020  Last eye exam / retinopathy screening: 10/15/2019 Last diabetic foot exam: 07/18/2020  Star Rating Drugs:  Medication:  Last Fill: Day  Supply Glimepiride 27m 01/01/2021 90  Metformin 5094m08/29/2022 90 Rosuvastatin 71m80m6/20/2022 12 week supply  ([facility] - due to fill today)  Ezetimibe 62m58m/13/2022 90  PCP appointment on 03/03/2021 for [place]  Belen Pesch Marijean NiemannA Allportistant [phone] have reviewed the care management and care coordination activities outlined in this encounter and I am certifying that I agree with the content of this note. No further action required.  MichDebbora DusarmD Clinical Pharmacist [facility] at [phone]

## 2021-01-12 ENCOUNTER — Other Ambulatory Visit: Payer: PPO

## 2021-01-19 ENCOUNTER — Encounter: Payer: PPO | Admitting: Family Medicine

## 2021-01-19 DIAGNOSIS — E119 Type 2 diabetes mellitus without complications: Secondary | ICD-10-CM | POA: Diagnosis not present

## 2021-01-19 LAB — HM DIABETES EYE EXAM

## 2021-01-22 DIAGNOSIS — C4442 Squamous cell carcinoma of skin of scalp and neck: Secondary | ICD-10-CM | POA: Diagnosis not present

## 2021-01-27 ENCOUNTER — Encounter: Payer: Self-pay | Admitting: Family Medicine

## 2021-02-09 DIAGNOSIS — C4442 Squamous cell carcinoma of skin of scalp and neck: Secondary | ICD-10-CM | POA: Diagnosis not present

## 2021-02-09 HISTORY — PX: MOHS SURGERY: SUR867

## 2021-02-14 ENCOUNTER — Other Ambulatory Visit: Payer: Self-pay | Admitting: Family Medicine

## 2021-02-16 MED ORDER — METFORMIN HCL ER 500 MG PO TB24: 1000.0000 mg | ORAL_TABLET | Freq: Two times a day (BID) | ORAL | 0 refills | Status: DC

## 2021-02-20 DIAGNOSIS — L570 Actinic keratosis: Secondary | ICD-10-CM | POA: Diagnosis not present

## 2021-02-20 DIAGNOSIS — Z85828 Personal history of other malignant neoplasm of skin: Secondary | ICD-10-CM | POA: Diagnosis not present

## 2021-02-20 DIAGNOSIS — C4442 Squamous cell carcinoma of skin of scalp and neck: Secondary | ICD-10-CM | POA: Diagnosis not present

## 2021-02-21 ENCOUNTER — Other Ambulatory Visit: Payer: Self-pay | Admitting: Family Medicine

## 2021-02-21 DIAGNOSIS — M85852 Other specified disorders of bone density and structure, left thigh: Secondary | ICD-10-CM

## 2021-02-21 DIAGNOSIS — E785 Hyperlipidemia, unspecified: Secondary | ICD-10-CM

## 2021-02-21 DIAGNOSIS — E1169 Type 2 diabetes mellitus with other specified complication: Secondary | ICD-10-CM

## 2021-02-21 DIAGNOSIS — E118 Type 2 diabetes mellitus with unspecified complications: Secondary | ICD-10-CM

## 2021-02-23 ENCOUNTER — Other Ambulatory Visit: Payer: PPO

## 2021-02-27 ENCOUNTER — Encounter: Payer: Self-pay | Admitting: Family Medicine

## 2021-03-03 ENCOUNTER — Ambulatory Visit (INDEPENDENT_AMBULATORY_CARE_PROVIDER_SITE_OTHER): Payer: PPO | Admitting: Family Medicine

## 2021-03-03 ENCOUNTER — Other Ambulatory Visit: Payer: Self-pay

## 2021-03-03 ENCOUNTER — Encounter: Payer: Self-pay | Admitting: Family Medicine

## 2021-03-03 VITALS — BP 124/62 | HR 56 | Temp 97.8°F | Ht 63.5 in | Wt 171.1 lb

## 2021-03-03 DIAGNOSIS — Z7189 Other specified counseling: Secondary | ICD-10-CM

## 2021-03-03 DIAGNOSIS — M85852 Other specified disorders of bone density and structure, left thigh: Secondary | ICD-10-CM | POA: Diagnosis not present

## 2021-03-03 DIAGNOSIS — Z789 Other specified health status: Secondary | ICD-10-CM

## 2021-03-03 DIAGNOSIS — Z0000 Encounter for general adult medical examination without abnormal findings: Secondary | ICD-10-CM

## 2021-03-03 DIAGNOSIS — E785 Hyperlipidemia, unspecified: Secondary | ICD-10-CM

## 2021-03-03 DIAGNOSIS — E118 Type 2 diabetes mellitus with unspecified complications: Secondary | ICD-10-CM | POA: Diagnosis not present

## 2021-03-03 DIAGNOSIS — R011 Cardiac murmur, unspecified: Secondary | ICD-10-CM

## 2021-03-03 DIAGNOSIS — E1169 Type 2 diabetes mellitus with other specified complication: Secondary | ICD-10-CM | POA: Diagnosis not present

## 2021-03-03 LAB — COMPREHENSIVE METABOLIC PANEL
ALT: 28 U/L (ref 0–35)
AST: 28 U/L (ref 0–37)
Albumin: 4.7 g/dL (ref 3.5–5.2)
Alkaline Phosphatase: 63 U/L (ref 39–117)
BUN: 15 mg/dL (ref 6–23)
CO2: 28 mEq/L (ref 19–32)
Calcium: 10 mg/dL (ref 8.4–10.5)
Chloride: 101 mEq/L (ref 96–112)
Creatinine, Ser: 0.7 mg/dL (ref 0.40–1.20)
GFR: 85.93 mL/min (ref >60.00)
Glucose, Bld: 110 mg/dL — ABNORMAL HIGH (ref 70–99)
Potassium: 4.1 mEq/L (ref 3.5–5.1)
Sodium: 136 mEq/L (ref 135–145)
Total Bilirubin: 0.7 mg/dL (ref 0.2–1.2)
Total Protein: 7.7 g/dL (ref 6.0–8.3)

## 2021-03-03 LAB — HEMOGLOBIN A1C: Hgb A1c MFr Bld: 7.4 % — ABNORMAL HIGH (ref 4.6–6.5)

## 2021-03-03 LAB — LIPID PANEL
Cholesterol: 194 mg/dL (ref 0–200)
HDL: 52.4 mg/dL (ref >39.00)
LDL Cholesterol: 113 mg/dL — ABNORMAL HIGH (ref 0–99)
NonHDL: 141.83
Total CHOL/HDL Ratio: 4
Triglycerides: 145 mg/dL (ref 0.0–149.0)
VLDL: 29 mg/dL (ref 0.0–40.0)

## 2021-03-03 LAB — VITAMIN D 25 HYDROXY (VIT D DEFICIENCY, FRACTURES): VITD: 39.05 ng/mL (ref 30.00–100.00)

## 2021-03-03 LAB — MICROALBUMIN / CREATININE URINE RATIO
Creatinine,U: 93.1 mg/dL
Microalb Creat Ratio: 0.8 mg/g (ref 0.0–30.0)
Microalb, Ur: <0.7 mg/dL (ref 0.0–1.9)

## 2021-03-03 MED ORDER — TRULICITY 0.75 MG/0.5ML ~~LOC~~ SOAJ: 0.7500 mg | SUBCUTANEOUS | 6 refills | Status: DC

## 2021-03-03 MED ORDER — METFORMIN HCL ER 500 MG PO TB24: 1000.0000 mg | ORAL_TABLET | Freq: Two times a day (BID) | ORAL | 3 refills | Status: DC

## 2021-03-03 MED ORDER — EZETIMIBE 10 MG PO TABS: 10.0000 mg | ORAL_TABLET | Freq: Every day | ORAL | 3 refills | Status: DC

## 2021-03-03 NOTE — Assessment & Plan Note (Addendum)
Chronic, h/o statin intolerance. Now only on zetia. Update FLP. The 10-year ASCVD risk score (Arnett DK, et al., 2019) is: 23.1%   Values used to calculate the score:     Age: 75 years     Sex: Female     Is Non-Hispanic African American: No     Diabetic: Yes     Tobacco smoker: No     Systolic Blood Pressure: 631 mmHg     Is BP treated: No     HDL Cholesterol: 42.2 mg/dL     Total Cholesterol: 217 mg/dL

## 2021-03-03 NOTE — Assessment & Plan Note (Signed)

## 2021-03-03 NOTE — Progress Notes (Signed)
Patient ID: Betty Sanders, female    DOB: 1945/05/13, 75 y.o.   MRN: [id number]  This visit was conducted in person.  BP 124/62   Pulse (!) 56   Temp 97.8 F (36.6 C) (Temporal)   Ht 5' 3.5" (1.613 m)   Wt 171 lb 1 oz (77.6 kg)   SpO2 98%   BMI 29.83 kg/m    CC: AMW Subjective:   HPI: Betty Sanders is a 75 y.o. female presenting on 03/03/2021 for Medicare Wellness (Wants to discuss starting Trulicity. )   Did not see health advisor this year.   Hearing Screening - Comments:: Wears bilateral hearing aids.  Wearing at today's OV.  Vision Screening - Comments:: Last eye exam, 01/2021.  [place] Visit from 03/03/2021 in [place] at [facility]  PHQ-2 Total Score 1       Fall Risk  03/03/2021 01/16/2020 11/13/2019 01/17/2019 01/10/2018  Falls in the past year? 0 0 0 0 No  Comment - - Emmi Telephone Survey: data to providers prior to load - -  Number falls in past yr: - 0 - - -  Injury with Fall? 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  Last visit we dropped crestor to once weekly and started zetia 60m daily.  She is interested in starting trulicity.   Preventative: COLONOSCOPY Date: 05/2015 diverticulosis, rpt 10 yrs ([facility] Mammogram 03/2020, Bir[facility] 1 @ [place]. Does breast exams at home.  Well woman exam - always normal. Last 10/2016. Fmhx (aunt) GYN cancer, unsure type. Will age out. no pelvic pain, vaginal bleeding, skin changes, abd pain or bloating, bowel changes.  DEXA (03/2020) T -1.7 (L femur). Good yogurt and ice cream and weight bearing exercise.  Flu shot yearly  CRaymond2/2021, 06/2019, Pfizer booster 02/2020, 10/2020, bivalent 01/2021.  Td 2009, Tdap 2019, 2020 Pneumovax 01/2013, prevnar 2016 Shingrix - completed 09/2019, 12/2019  Advanced planning - scanned  into chart 2022. Sister SRichrd Soxis HCPOA. does not want prolonged life support if terminal condition. Wants HCPOA recommendations followed.  Seat belt use discussed Sunscreen use discussed - no changing moles on skin. Sees dermatology  Non smoker Alcohol - seldom Dentist - Q6 mo  Eye doctor-  Yearly Bowel - some constipation and diarrhea - managing with fruits/vegetables but that raises sugars.  Bladder - no incontinence   Lives with husband and 1 dog ([place] Occupation: retired, prior worked at [employer] in [employer] Activity: kayak, restarting treadmill, wears fit bit - wants to get to 10000steps/day Diet: My.fitness.pal 1200cal/day, good water, fruits/vegetables daily      Relevant past medical, surgical, family and social history reviewed and updated as indicated. Interim medical history since our last visit reviewed. Allergies and medications reviewed and updated. Outpatient Medications Prior to Visit  Medication Sig Dispense Refill   Blood Glucose Monitoring Suppl (ONETOUCH VERIO IQ SYSTEM) w/Device KIT Check blood sugar twice a day and as directed. Dx E11.9 1 kit 0   Cholecalciferol (VITAMIN D3) 25 MCG (1000 UT) CAPS Take 1 capsule (1,000 Units total) by mouth daily. 30 capsule    Coenzyme Q10 (COQ10) 50 MG CAPS Take 1 capsule by mouth in the morning and at bedtime.  glimepiride (AMARYL) 1 MG tablet TAKE 1 TABLET (1 MG TOTAL) BY MOUTH DAILY WITH BREAKFAST. 90 tablet 0   glucose blood (ONETOUCH VERIO) test strip Check blood sugar twice a day. 200 strip 3   ONETOUCH DELICA LANCETS 72Z MISC Check blood sugar twice a day and as directed. Dx E11.9 100 each 6   Probiotic Product (PROBIOTIC PO) Take by mouth daily.     vitamin C (ASCORBIC ACID) 500 MG tablet Take 500 mg by mouth as needed.     ezetimibe (ZETIA) 10 MG tablet Take 1 tablet (10 mg total) by mouth daily. 30 tablet 11   metFORMIN (GLUCOPHAGE-XR) 500 MG 24 hr tablet Take 2 tablets (1,000 mg total)  by mouth in the morning and at bedtime. 360 tablet 0   No facility-administered medications prior to visit.     Per HPI unless specifically indicated in ROS section below Review of Systems  Constitutional:  Negative for activity change, appetite change, chills, fatigue, fever and unexpected weight change.  HENT:  Negative for hearing loss.   Eyes:  Negative for visual disturbance.  Respiratory:  Negative for cough, chest tightness, shortness of breath and wheezing.   Cardiovascular:  Negative for chest pain, palpitations and leg swelling.  Gastrointestinal:  Positive for constipation and diarrhea. Negative for abdominal distention, abdominal pain, blood in stool, nausea and vomiting.  Genitourinary:  Negative for difficulty urinating and hematuria.  Musculoskeletal:  Negative for arthralgias, myalgias and neck pain.  Skin:  Negative for rash.  Neurological:  Negative for dizziness, seizures, syncope and headaches.  Hematological:  Negative for adenopathy. Bruises/bleeds easily.  Psychiatric/Behavioral:  Negative for dysphoric mood. The patient is not nervous/anxious.    Objective:  BP 124/62   Pulse (!) 56   Temp 97.8 F (36.6 C) (Temporal)   Ht 5' 3.5" (1.613 m)   Wt 171 lb 1 oz (77.6 kg)   SpO2 98%   BMI 29.83 kg/m   Wt Readings from Last 3 Encounters:  03/03/21 171 lb 1 oz (77.6 kg)  07/18/20 176 lb 1 oz (79.9 kg)  05/21/20 176 lb (79.8 kg)      Physical Exam Vitals and nursing note reviewed.  Constitutional:      Appearance: Normal appearance. She is not ill-appearing.  HENT:     Head: Normocephalic and atraumatic.     Right Ear: Tympanic membrane, ear canal and external ear normal. There is no impacted cerumen.     Left Ear: Tympanic membrane, ear canal and external ear normal. There is no impacted cerumen.  Eyes:     General:        Right eye: No discharge.        Left eye: No discharge.     Extraocular Movements: Extraocular movements intact.      Conjunctiva/sclera: Conjunctivae normal.     Pupils: Pupils are equal, round, and reactive to light.  Neck:     Thyroid: No thyroid mass or thyromegaly.  Cardiovascular:     Rate and Rhythm: Normal rate and regular rhythm.     Pulses: Normal pulses.     Heart sounds: Murmur (2/6 systolic USB) heard.  Pulmonary:     Effort: Pulmonary effort is normal. No respiratory distress.     Breath sounds: Normal breath sounds. No wheezing, rhonchi or rales.  Abdominal:     General: Bowel sounds are normal. There is no distension.     Palpations: Abdomen is soft. There is no mass.     Tenderness:  There is no abdominal tenderness. There is no guarding or rebound.     Hernia: No hernia is present.  Musculoskeletal:     Cervical back: Normal range of motion and neck supple. No rigidity.     Right lower leg: No edema.     Left lower leg: No edema.  Lymphadenopathy:     Cervical: No cervical adenopathy.  Skin:    General: Skin is warm and dry.     Findings: No rash.  Neurological:     General: No focal deficit present.     Mental Status: She is alert. Mental status is at baseline.     Comments:  Recall 3/3 Calculation 5/5 DLROW  Psychiatric:        Mood and Affect: Mood normal.        Behavior: Behavior normal.      Results for orders placed or performed in visit on 01/27/21  HM DIABETES EYE EXAM  Result Value Ref Range   HM Diabetic Eye Exam No Retinopathy No Retinopathy   Lab Results  Component Value Date   HGBA1C 8.0 (H) 07/14/2020     Assessment & Plan:  This visit occurred during the SARS-CoV-2 public health emergency.  Safety protocols were in place, including screening questions prior to the visit, additional usage of staff PPE, and extensive cleaning of exam room while observing appropriate contact time as indicated for disinfecting solutions.   Problem List Items Addressed This Visit     Healthcare maintenance (Chronic)    Preventative protocols reviewed and updated unless  pt declined. Discussed healthy diet and lifestyle.       Advanced care planning/counseling discussion (Chronic)    Advanced planning - scanned into chart 2022. Sister Richrd Sox is HCPOA. does not want prolonged life support if terminal condition. Wants HCPOA recommendations followed.       Medicare annual wellness visit, subsequent - Primary (Chronic)    I have personally reviewed the Medicare Annual Wellness questionnaire and have noted 1. The patient's medical and social history 2. Their use of alcohol, tobacco or illicit drugs 3. Their current medications and supplements 4. The patient's functional ability including ADL's, fall risks, home safety risks and hearing or visual impairment. Cognitive function has been assessed and addressed as indicated.  5. Diet and physical activity 6. Evidence for depression or mood disorders The patients weight, height, BMI have been recorded in the chart. I have made referrals, counseling and provided education to the patient based on review of the above and I have provided the pt with a written personalized care plan for preventive services. Provider list updated.. See scanned questionairre as needed for further documentation. Reviewed preventative protocols and updated unless pt declined.       Controlled diabetes mellitus type 2 with complications (HCC)    Update A1c. Interested in starting weekly SWH6PR - will send trulicity. Reviewed side effects to watch for. No h/o pancreatitis or fmhx thyroid cancer.  RTC 6 mo DM f/u visit.       Relevant Medications   Dulaglutide (TRULICITY) 9.16 BW/4.6KZ SOPN   metFORMIN (GLUCOPHAGE-XR) 500 MG 24 hr tablet   Hyperlipidemia associated with type 2 diabetes mellitus (HCC)    Chronic, h/o statin intolerance. Now only on zetia. Update FLP. The 10-year ASCVD risk score (Arnett DK, et al., 2019) is: 23.1%   Values used to calculate the score:     Age: 67 years     Sex: Female     Is Non-Hispanic  African  American: No     Diabetic: Yes     Tobacco smoker: No     Systolic Blood Pressure: 492 mmHg     Is BP treated: No     HDL Cholesterol: 42.2 mg/dL     Total Cholesterol: 217 mg/dL       Relevant Medications   Dulaglutide (TRULICITY) 0.10 OF/1.2RF SOPN   ezetimibe (ZETIA) 10 MG tablet   metFORMIN (GLUCOPHAGE-XR) 500 MG 24 hr tablet   Osteopenia    Discussed cal, vit D dosing and weight bearing exercise.      Systolic murmur    Echo showing mild MR and mild-mod aortic sclerosis without stenosis      Statin intolerance    Myalgias to even once weekly Crestor        Meds ordered this encounter  Medications   Dulaglutide (TRULICITY) 7.58 IT/2.5QD SOPN    Sig: Inject 0.75 mg into the skin once a week.    Dispense:  2 mL    Refill:  6   ezetimibe (ZETIA) 10 MG tablet    Sig: Take 1 tablet (10 mg total) by mouth daily.    Dispense:  90 tablet    Refill:  3   metFORMIN (GLUCOPHAGE-XR) 500 MG 24 hr tablet    Sig: Take 2 tablets (1,000 mg total) by mouth in the morning and at bedtime.    Dispense:  360 tablet    Refill:  3    No orders of the defined types were placed in this encounter.   Patient instructions: Labs today  Price out trulicity sent to pharmacy. If sugars start trending down, stop glimepiride.  You are doing well today Return as needed or in 6 months for diabetes follow up visit.   Follow up plan: Return in about 6 months (around 08/31/2021) for follow up visit.  Ria Bush, MD

## 2021-03-03 NOTE — Assessment & Plan Note (Signed)
Preventative protocols reviewed and updated unless pt declined. Discussed healthy diet and lifestyle.  

## 2021-03-03 NOTE — Assessment & Plan Note (Signed)
Myalgias to even once weekly Crestor

## 2021-03-03 NOTE — Assessment & Plan Note (Addendum)
Update A1c. Interested in starting weekly CGB8OR - will send trulicity. Reviewed side effects to watch for. No h/o pancreatitis or fmhx thyroid cancer.  RTC 6 mo DM f/u visit.

## 2021-03-03 NOTE — Patient Instructions (Addendum)
Labs today  Price out trulicity sent to pharmacy. If sugars start trending down, stop glimepiride.  You are doing well today Return as needed or in 6 months for diabetes follow up visit.   Health Maintenance After Age 73 After age 32, you are at a higher risk for certain long-term diseases and infections as well as injuries from falls. Falls are a major cause of broken bones and head injuries in people who are older than age 32. Getting regular preventive care can help to keep you healthy and well. Preventive care includes getting regular testing and making lifestyle changes as recommended by your health care provider. Talk with your health care provider about: Which screenings and tests you should have. A screening is a test that checks for a disease when you have no symptoms. A diet and exercise plan that is right for you. What should I know about screenings and tests to prevent falls? Screening and testing are the best ways to find a health problem early. Early diagnosis and treatment give you the best chance of managing medical conditions that are common after age 24. Certain conditions and lifestyle choices may make you more likely to have a fall. Your health care provider may recommend: Regular vision checks. Poor vision and conditions such as cataracts can make you more likely to have a fall. If you wear glasses, make sure to get your prescription updated if your vision changes. Medicine review. Work with your health care provider to regularly review all of the medicines you are taking, including over-the-counter medicines. Ask your health care provider about any side effects that may make you more likely to have a fall. Tell your health care provider if any medicines that you take make you feel dizzy or sleepy. Strength and balance checks. Your health care provider may recommend certain tests to check your strength and balance while standing, walking, or changing positions. Foot health exam.  Foot pain and numbness, as well as not wearing proper footwear, can make you more likely to have a fall. Screenings, including: Osteoporosis screening. Osteoporosis is a condition that causes the bones to get weaker and break more easily. Blood pressure screening. Blood pressure changes and medicines to control blood pressure can make you feel dizzy. Depression screening. You may be more likely to have a fall if you have a fear of falling, feel depressed, or feel unable to do activities that you used to do. Alcohol use screening. Using too much alcohol can affect your balance and may make you more likely to have a fall. Follow these instructions at home: Lifestyle Do not drink alcohol if: Your health care provider tells you not to drink. If you drink alcohol: Limit how much you have to: 0-1 drink a day for women. 0-2 drinks a day for men. Know how much alcohol is in your drink. In the [place] one drink equals one 12 oz bottle of beer (355 mL), one 5 oz glass of wine (148 mL), or one 1 oz glass of hard liquor (44 mL). Do not use any products that contain nicotine or tobacco. These products include cigarettes, chewing tobacco, and vaping devices, such as e-cigarettes. If you need help quitting, ask your health care provider. Activity  Follow a regular exercise program to stay fit. This will help you maintain your balance. Ask your health care provider what types of exercise are appropriate for you. If you need a cane or walker, use it as recommended by your health care provider. Wear  supportive shoes that have nonskid soles. Safety  Remove any tripping hazards, such as rugs, cords, and clutter. Install safety equipment such as grab bars in bathrooms and safety rails on stairs. Keep rooms and walkways well-lit. General instructions Talk with your health care provider about your risks for falling. Tell your health care provider if: You fall. Be sure to tell your health care provider about all  falls, even ones that seem minor. You feel dizzy, tiredness (fatigue), or off-balance. Take over-the-counter and prescription medicines only as told by your health care provider. These include supplements. Eat a healthy diet and maintain a healthy weight. A healthy diet includes low-fat dairy products, low-fat (lean) meats, and fiber from whole grains, beans, and lots of fruits and vegetables. Stay current with your vaccines. Schedule regular health, dental, and eye exams. Summary Having a healthy lifestyle and getting preventive care can help to protect your health and wellness after age 34. Screening and testing are the best way to find a health problem early and help you avoid having a fall. Early diagnosis and treatment give you the best chance for managing medical conditions that are more common for people who are older than age 43. Falls are a major cause of broken bones and head injuries in people who are older than age 87. Take precautions to prevent a fall at home. Work with your health care provider to learn what changes you can make to improve your health and wellness and to prevent falls. This information is not intended to replace advice given to you by your health care provider. Make sure you discuss any questions you have with your health care provider. Document Revised: 08/25/2020 Document Reviewed: 08/25/2020 Elsevier Patient Education  [place].

## 2021-03-03 NOTE — Assessment & Plan Note (Signed)
Echo showing mild MR and mild-mod aortic sclerosis without stenosis

## 2021-03-03 NOTE — Assessment & Plan Note (Signed)
Discussed cal, vit D dosing and weight bearing exercise.

## 2021-03-03 NOTE — Assessment & Plan Note (Signed)
Advanced planning - scanned into chart 2022. Sister Richrd Sox is HCPOA. does not want prolonged life support if terminal condition. Wants HCPOA recommendations followed.

## 2021-03-04 ENCOUNTER — Other Ambulatory Visit: Payer: Self-pay | Admitting: Family Medicine

## 2021-03-04 DIAGNOSIS — Z1231 Encounter for screening mammogram for malignant neoplasm of breast: Secondary | ICD-10-CM

## 2021-03-13 ENCOUNTER — Other Ambulatory Visit: Payer: Self-pay | Admitting: Family Medicine

## 2021-03-16 DIAGNOSIS — C4442 Squamous cell carcinoma of skin of scalp and neck: Secondary | ICD-10-CM | POA: Diagnosis not present

## 2021-03-26 ENCOUNTER — Encounter: Payer: Self-pay | Admitting: Family Medicine

## 2021-03-26 ENCOUNTER — Telehealth: Payer: Self-pay

## 2021-03-26 NOTE — Telephone Encounter (Signed)
See my chart message

## 2021-03-26 NOTE — Telephone Encounter (Signed)
Message from pt via [facility] requesting 45-VPL supply of Trulicity.

## 2021-03-27 MED ORDER — TRULICITY 0.75 MG/0.5ML ~~LOC~~ SOAJ: 0.7500 mg | SUBCUTANEOUS | 3 refills | Status: DC

## 2021-03-27 NOTE — Addendum Note (Signed)
Addended by: Ria Bush on: 03/27/2021 12:48 PM   Modules accepted: Orders

## 2021-03-31 ENCOUNTER — Other Ambulatory Visit: Payer: Self-pay | Admitting: Family Medicine

## 2021-04-03 ENCOUNTER — Ambulatory Visit
Admission: RE | Admit: 2021-04-03 | Discharge: 2021-04-03 | Disposition: A | Discharge status: Home / Self Care | Payer: PPO | Source: Ambulatory Visit | Attending: Family Medicine | Admitting: Family Medicine

## 2021-04-03 ENCOUNTER — Other Ambulatory Visit: Payer: Self-pay

## 2021-04-03 DIAGNOSIS — Z1231 Encounter for screening mammogram for malignant neoplasm of breast: Secondary | ICD-10-CM | POA: Diagnosis not present

## 2021-05-07 ENCOUNTER — Encounter: Payer: Self-pay | Admitting: Family Medicine

## 2021-05-07 DIAGNOSIS — Z85828 Personal history of other malignant neoplasm of skin: Secondary | ICD-10-CM | POA: Diagnosis not present

## 2021-05-07 DIAGNOSIS — L570 Actinic keratosis: Secondary | ICD-10-CM | POA: Diagnosis not present

## 2021-05-07 DIAGNOSIS — C4442 Squamous cell carcinoma of skin of scalp and neck: Secondary | ICD-10-CM | POA: Diagnosis not present

## 2021-05-29 ENCOUNTER — Encounter: Payer: Self-pay | Admitting: Nurse Practitioner

## 2021-05-29 ENCOUNTER — Other Ambulatory Visit: Payer: Self-pay

## 2021-05-29 ENCOUNTER — Ambulatory Visit (INDEPENDENT_AMBULATORY_CARE_PROVIDER_SITE_OTHER): Payer: PPO | Admitting: Nurse Practitioner

## 2021-05-29 VITALS — BP 118/56 | HR 60 | Temp 97.0°F | Resp 12 | Ht 63.5 in | Wt 167.5 lb

## 2021-05-29 DIAGNOSIS — R0600 Dyspnea, unspecified: Secondary | ICD-10-CM | POA: Diagnosis not present

## 2021-05-29 DIAGNOSIS — J069 Acute upper respiratory infection, unspecified: Secondary | ICD-10-CM | POA: Diagnosis not present

## 2021-05-29 MED ORDER — FLUTICASONE PROPIONATE 50 MCG/ACT NA SUSP: 2.0000 | Freq: Every day | NASAL | 0 refills | Status: DC

## 2021-05-29 MED ORDER — DOXYCYCLINE HYCLATE 100 MG PO TABS: 100.0000 mg | ORAL_TABLET | Freq: Two times a day (BID) | ORAL | 0 refills | Status: AC

## 2021-05-29 NOTE — Progress Notes (Signed)
Acute Office Visit  Subjective:    Patient ID: Betty Sanders, female    DOB: 01/11/1946, 76 y.o.   MRN: [id number]  Chief Complaint  Patient presents with   Cough    Runny nose, post nasal drip, hoarse voice, blowing out mostly clear mucus with slight yellow color, headache, sinus pressure. No fever. Covid test negative x 3, latest one this morning 05/29/21. Has taking cough drops, cough syrup, allergy OTC medication, alkelsetzer.     Patient is in today for Cough/sinus pressure  Symptoms started approx 10 days ago. Started with sneezing and when she bent forward her nose would run. Covid test times 3 and all were negative  Covid vaccines x2 with 3 boosters No sick contacts Symptoms have improved since yesterday States coughing is worse at night.  Has tried cough drops, teas, antihistamine, and zicam nasal products without great relief   Past Medical History:  Diagnosis Date   Bilateral hearing loss 2016   hearing aides   Diabetes mellitus without complication ([place])    Dyslipidemia    Heart murmur    Hyperlipidemia    Osteopenia 05/2014   DEXA at [facility]   Squamous cell cancer of scalp and skin of neck 2013   s/p excision (Dr. Jimmye Norman)   Squamous cell cancer of scalp and skin of neck 01/2021   s/p Mohs (Dr Lacinda Axon at [facility])   Type 2 diabetes mellitus, uncontrolled 02/2012   DSME completed 07/2014   Wears hearing aid in both ears     Past Surgical History:  Procedure Laterality Date   BELPHAROPTOSIS REPAIR Bilateral 11/2016   CATARACT EXTRACTION W/PHACO Left 05/07/2020   Procedure: CATARACT EXTRACTION PHACO AND INTRAOCULAR LENS PLACEMENT (IOC) LEFT DIABETIC 8.70 01:28.6 9.8%;  Surgeon: Leandrew Koyanagi, MD;  Location: [place];  Service: Ophthalmology;  Laterality: Left;  Diabetic - oral meds   CATARACT EXTRACTION W/PHACO Right 05/21/2020   Procedure: CATARACT EXTRACTION PHACO AND INTRAOCULAR LENS PLACEMENT (IOC) RIGHT DIABETIC 4.82 00:45.2 10.6%;   Surgeon: Leandrew Koyanagi, MD;  Location: [place];  Service: Ophthalmology;  Laterality: Right;   COLONOSCOPY  02/2010   tubular adenoma polyp x 1; repeat 5 years--Dr. Carlean Purl   COLONOSCOPY  05/2015   diverticulosis, rpt 10 yrs Carlean Purl)   DENTAL SURGERY  09/2017   dental implants   DEXA  05/2014   spine -2.1, hip -1.4   GANGLION CYST EXCISION  2006   Left wrist   MOHS SURGERY  02/09/2021   squamous cell of frontal scalp (Dr Lacinda Axon at [facility])   RHINOPLASTY  1970's   WRIST SURGERY      Family History  Problem Relation Age of Onset   Lung cancer Father    Colon cancer Cousin    Breast cancer Paternal Aunt    Cervical cancer Maternal Aunt 81       female cancer   Diabetes Maternal Grandfather    Stroke Paternal Grandfather    CAD Neg Hx     Social History   Socioeconomic History   Marital status: Widowed    Spouse name: Not on file   Number of children: Not on file   Years of education: Not on file   Highest education level: Not on file  Occupational History   Occupation: Radiation Physicist    Employer: [employer]  Tobacco Use   Smoking status: Former    Types: Cigarettes    Quit date: 04/19/1968    Years since quitting: 36.1  Smokeless tobacco: Never  Vaping Use   Vaping Use: Never used  Substance and Sexual Activity   Alcohol use: Yes    Alcohol/week: 0.0 standard drinks    Comment: Rare 1 glass wine per month   Drug use: No   Sexual activity: Not on file  Other Topics Concern   Not on file  Social History Narrative   Widower - husband passed away 11/03/2016 (amyloid)   Has 1 dog ([place])   Occupation: retired, 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   Activity: kayak, restarting treadmill, wears fit bit - wants to get to 10000steps/day   Diet: My.fitness.pal 1200cal/day, good water, fruits/vegetables daily   Social Determinants of Health   Financial Resource Strain: Low Risk    Difficulty of Paying Living Expenses: Not  very hard  Food Insecurity: Not on file  Transportation Needs: Not on file  Physical Activity: Not on file  Stress: Not on file  Social Connections: Not on file  Intimate Partner Violence: Not on file    Outpatient Medications Prior to Visit  Medication Sig Dispense Refill   Blood Glucose Monitoring Suppl (ONETOUCH VERIO IQ SYSTEM) w/Device KIT Check blood sugar twice a day and as directed. Dx E11.9 1 kit 0   Cholecalciferol (VITAMIN D3) 25 MCG (1000 UT) CAPS Take 1 capsule (1,000 Units total) by mouth daily. 30 capsule    Coenzyme Q10 (COQ10) 50 MG CAPS Take 1 capsule by mouth in the morning and at bedtime.     Dulaglutide (TRULICITY) 8.50 YD/7.4JO SOPN Inject 0.75 mg into the skin once a week. 6 mL 3   ezetimibe (ZETIA) 10 MG tablet Take 1 tablet (10 mg total) by mouth daily. 90 tablet 3   glucose blood (ONETOUCH VERIO) test strip Check blood sugar twice a day. 200 strip 3   metFORMIN (GLUCOPHAGE-XR) 500 MG 24 hr tablet Take 2 tablets (1,000 mg total) by mouth in the morning and at bedtime. 360 tablet 3   ONETOUCH DELICA LANCETS 87O MISC Check blood sugar twice a day and as directed. Dx E11.9 100 each 6   Probiotic Product (PROBIOTIC PO) Take by mouth daily.     vitamin C (ASCORBIC ACID) 500 MG tablet Take 500 mg by mouth as needed.     No facility-administered medications prior to visit.    Allergies  Allergen Reactions   Lipitor [Atorvastatin] Other (See Comments)    myalgia   Pravastatin Other (See Comments)   Sulfa Antibiotics     Possible rxn, unsure.    Review of Systems  Constitutional:  Negative for appetite change, chills, fatigue and fever.  HENT:  Positive for ear pain (fullness), sinus pressure, sinus pain and voice change (hoarseness, has improved since yesterday). Negative for congestion, ear discharge and sore throat.   Respiratory:  Positive for cough (mostly in the morning yellow tinge. mostly.). Negative for shortness of breath.   Cardiovascular:  Negative  for chest pain.  Gastrointestinal:  Negative for abdominal pain, diarrhea, nausea and vomiting.  Musculoskeletal:  Negative for arthralgias and myalgias.  Neurological:  Positive for headaches. Negative for dizziness and light-headedness.      Objective:    Physical Exam Vitals and nursing note reviewed.  Constitutional:      Appearance: Normal appearance.  HENT:     Right Ear: Tympanic membrane, ear canal and external ear normal.     Left Ear: Tympanic membrane, ear canal and external ear normal.     Nose:  Right Sinus: No maxillary sinus tenderness or frontal sinus tenderness.     Left Sinus: No maxillary sinus tenderness or frontal sinus tenderness.     Mouth/Throat:     Mouth: Mucous membranes are moist.     Pharynx: Oropharynx is clear.  Cardiovascular:     Rate and Rhythm: Normal rate and regular rhythm.     Heart sounds: Murmur heard.  Pulmonary:     Effort: Pulmonary effort is normal.     Breath sounds: Normal breath sounds.  Abdominal:     General: Bowel sounds are normal.  Lymphadenopathy:     Cervical: No cervical adenopathy.  Neurological:     Mental Status: She is alert.    BP (!) 118/56    Pulse 60    Temp (!) 97 F (36.1 C)    Resp 12    Ht 5' 3.5" (1.613 m)    Wt 167 lb 8 oz (76 kg)    SpO2 98%    BMI 29.21 kg/m  Wt Readings from Last 3 Encounters:  05/29/21 167 lb 8 oz (76 kg)  03/03/21 171 lb 1 oz (77.6 kg)  07/18/20 176 lb 1 oz (79.9 kg)    There are no preventive care reminders to display for this patient.  There are no preventive care reminders to display for this patient.   No results found for: TSH Lab Results  Component Value Date   WBC 8.6 05/18/2012   HGB 12.1 05/18/2012   HCT 35.0 (L) 05/18/2012   MCV 79.6 05/18/2012   PLT 331.0 05/18/2012   Lab Results  Component Value Date   NA 136 03/03/2021   K 4.1 03/03/2021   CO2 28 03/03/2021   GLUCOSE 110 (H) 03/03/2021   BUN 15 03/03/2021   CREATININE 0.70 03/03/2021   BILITOT  0.7 03/03/2021   ALKPHOS 63 03/03/2021   AST 28 03/03/2021   ALT 28 03/03/2021   PROT 7.7 03/03/2021   ALBUMIN 4.7 03/03/2021   CALCIUM 10.0 03/03/2021   GFR 85.93 03/03/2021   Lab Results  Component Value Date   CHOL 194 03/03/2021   Lab Results  Component Value Date   HDL 52.40 03/03/2021   Lab Results  Component Value Date   LDLCALC 113 (H) 03/03/2021   Lab Results  Component Value Date   TRIG 145.0 03/03/2021   Lab Results  Component Value Date   CHOLHDL 4 03/03/2021   Lab Results  Component Value Date   HGBA1C 7.4 (H) 03/03/2021       Assessment & Plan:   Problem List Items Addressed This Visit       Respiratory   Upper respiratory tract infection    Patient symptoms of been going on for 10 days.  Select had some improvement today.  We will do a wasp prescription for doxycycline.  Told patient not to fill currently see if she feels over the weekend if she worsens or does not continue to improve she knows to fill prescription.      Relevant Medications   doxycycline (VIBRA-TABS) 100 MG tablet     Other   PND (paroxysmal nocturnal dyspnea) - Primary    Given patient's symptoms and description.  Likely postnasal drip.  We will start patient on Flonase 2 sprays per nostril each day.  Did give Flonase precautions in regards to inciting epistaxis.  Continue to monitor follow-up if symptoms do not improve      Relevant Medications   fluticasone (FLONASE) 50 MCG/ACT  nasal spray     Meds ordered this encounter  Medications   fluticasone (FLONASE) 50 MCG/ACT nasal spray    Sig: Place 2 sprays into both nostrils daily.    Dispense:  16 g    Refill:  0    Order Specific Question:   Supervising Provider    Answer:   Glori Bickers, MARNE A [[id number]]   doxycycline (VIBRA-TABS) 100 MG tablet    Sig: Take 1 tablet (100 mg total) by mouth 2 (two) times daily for 7 days.    Dispense:  14 tablet    Refill:  0    DO NOT FILL unless patient calls over the weekend    Order  Specific Question:   Supervising Provider    Answer:   Loura Pardon A [[id number]]   This visit occurred during the SARS-CoV-2 public health emergency.  Safety protocols were in place, including screening questions prior to the visit, additional usage of staff PPE, and extensive cleaning of exam room while observing appropriate contact time as indicated for disinfecting solutions.    Romilda Garret, NP

## 2021-05-29 NOTE — Assessment & Plan Note (Signed)
Given patient's symptoms and description.  Likely postnasal drip.  We will start patient on Flonase 2 sprays per nostril each day.  Did give Flonase precautions in regards to inciting epistaxis.  Continue to monitor follow-up if symptoms do not improve

## 2021-05-29 NOTE — Patient Instructions (Signed)
Nice to see you today Sent medications to pharmacy Do not fill the antibiotic unless you worsen Follow up if no improvement

## 2021-05-29 NOTE — Assessment & Plan Note (Signed)
Patient symptoms of been going on for 10 days.  Select had some improvement today.  We will do a wasp prescription for doxycycline.  Told patient not to fill currently see if she feels over the weekend if she worsens or does not continue to improve she knows to fill prescription.

## 2021-06-20 ENCOUNTER — Other Ambulatory Visit: Payer: Self-pay | Admitting: Family Medicine

## 2021-06-20 ENCOUNTER — Other Ambulatory Visit: Payer: Self-pay | Admitting: Nurse Practitioner

## 2021-06-20 DIAGNOSIS — R0600 Dyspnea, unspecified: Secondary | ICD-10-CM

## 2021-06-22 ENCOUNTER — Encounter: Payer: Self-pay | Admitting: Family Medicine

## 2021-06-22 NOTE — Telephone Encounter (Signed)
Spoke with pt informing her the denied refill was for metformin XR TID.  I explained there should be a new rx on file at [facility] for metformin XR 2 tablets BID.  She says the old rx must still be on auto refill.  I suggested pt call and actually speak with the pharmacy to have them stop auto refill for old rx and ask them to fill the new rx on file.  Pt agrees and will call [facility].  ?

## 2021-06-22 NOTE — Telephone Encounter (Signed)
Change in directions.  Now takes 2 tabs BID.  New rx sent 03/03/21, #360/3. ?

## 2021-06-24 ENCOUNTER — Telehealth: Payer: Self-pay

## 2021-06-24 NOTE — Progress Notes (Signed)
?  Transition CCM to Self Care ? ?Patient contacted to inform they have achieved their CCM goals and no longer need to be contacted as frequently. Patient advised services will still be available to them if they would like to reach out or have any new health concerns. Verified patient had contact information to pharmacist and health concierge on hand. Patient made aware CCM services would be continued if desired. Patient consented to cancel future CCM appointments. ? ?Charlene Brooke, CPP notified ? ?Marijean Niemann, RMA ?Clinical Pharmacy Assistant ?[phone] ? ? ?

## 2021-06-29 DIAGNOSIS — L821 Other seborrheic keratosis: Secondary | ICD-10-CM | POA: Diagnosis not present

## 2021-06-29 DIAGNOSIS — I8391 Asymptomatic varicose veins of right lower extremity: Secondary | ICD-10-CM | POA: Diagnosis not present

## 2021-06-29 DIAGNOSIS — I8392 Asymptomatic varicose veins of left lower extremity: Secondary | ICD-10-CM | POA: Diagnosis not present

## 2021-06-29 DIAGNOSIS — Z85828 Personal history of other malignant neoplasm of skin: Secondary | ICD-10-CM | POA: Diagnosis not present

## 2021-06-29 DIAGNOSIS — L570 Actinic keratosis: Secondary | ICD-10-CM | POA: Diagnosis not present

## 2021-07-28 ENCOUNTER — Encounter: Payer: Self-pay | Admitting: Family Medicine

## 2021-08-03 ENCOUNTER — Telehealth: Payer: PPO

## 2021-08-03 DIAGNOSIS — Z85828 Personal history of other malignant neoplasm of skin: Secondary | ICD-10-CM | POA: Diagnosis not present

## 2021-08-03 DIAGNOSIS — L570 Actinic keratosis: Secondary | ICD-10-CM | POA: Diagnosis not present

## 2021-08-07 ENCOUNTER — Telehealth: Payer: PPO

## 2021-08-25 ENCOUNTER — Encounter: Payer: Self-pay | Admitting: Family Medicine

## 2021-09-01 ENCOUNTER — Ambulatory Visit (INDEPENDENT_AMBULATORY_CARE_PROVIDER_SITE_OTHER): Payer: PPO | Admitting: Family Medicine

## 2021-09-01 ENCOUNTER — Encounter: Payer: Self-pay | Admitting: Family Medicine

## 2021-09-01 VITALS — BP 134/66 | HR 71 | Temp 97.9°F | Ht 63.5 in | Wt 163.2 lb

## 2021-09-01 DIAGNOSIS — C4442 Squamous cell carcinoma of skin of scalp and neck: Secondary | ICD-10-CM | POA: Insufficient documentation

## 2021-09-01 DIAGNOSIS — E1169 Type 2 diabetes mellitus with other specified complication: Secondary | ICD-10-CM | POA: Diagnosis not present

## 2021-09-01 DIAGNOSIS — E118 Type 2 diabetes mellitus with unspecified complications: Secondary | ICD-10-CM

## 2021-09-01 DIAGNOSIS — E663 Overweight: Secondary | ICD-10-CM

## 2021-09-01 LAB — POCT GLYCOSYLATED HEMOGLOBIN (HGB A1C): Hemoglobin A1C: 7.1 % — AB (ref 4.0–5.6)

## 2021-09-01 NOTE — Patient Instructions (Signed)
Congrats on weight loss! ?A1c is improved but still slightly high at 7.1%.  ?Continue current medicines.  ?Return as needed or in 6 months for physical/wellness vist.  ?

## 2021-09-01 NOTE — Assessment & Plan Note (Signed)
Chronic, improved but A1c still slightly elevated. Congratulated on sugar control and weight loss to date. Continue current regimen.  ?

## 2021-09-01 NOTE — Assessment & Plan Note (Signed)
Congratulated on weight loss to date. 

## 2021-09-01 NOTE — Progress Notes (Signed)
? ? Patient ID: Betty Sanders, female    DOB: 02-27-1946, 76 y.o.   MRN: [id number] ? ?This visit was conducted in person. ? ?BP 134/66   Pulse 71   Temp 97.9 ?F (36.6 ?C) (Temporal)   Ht 5' 3.5" (1.613 m)   Wt 163 lb 4 oz (74 kg)   SpO2 97%   BMI 28.46 kg/m?   ? ?CC: 6 month DM f/u visit  ?Subjective:  ? ?HPI: ?Betty Sanders is a 76 y.o. female presenting on 09/01/2021 for Diabetes (Here for 6 mo f/u.) ? ? ?Squamous cell skin cancer to scalp - s/p Moh's surgery 01/2021. Trouble healing - she is using imiquimod 5% cream to scalp through dermatology.  ? ?Notes some exertional shortness of breath since starting imiquimod.  ? ?DM - does regularly check sugars fasting 130s. Compliant with antihyperglycemic regimen which includes: trulicity 8.10FB weekly, metformin XR 1039m bid. Occasional amaryl 1/2 tab use. Tolerating trulicity well, feels tolerating metformin better since starting GLP1RA. Denies low sugars or hypoglycemic symptoms. Denies paresthesias, blurry vision. Last diabetic eye exam 01/2021. Glucometer brand: one touch verio IQ. Last foot exam: 07/2020 DUE. [facility]  ?Lab Results  ?Component Value Date  ? HGBA1C 7.1 (A) 09/01/2021  ? ?Diabetic Foot Exam - Simple   ?Simple Foot Form ?Diabetic Foot exam was performed with the following findings: Yes 09/01/2021  9:39 AM  ?Visual Inspection ?No deformities, no ulcerations, no other skin breakdown bilaterally: Yes ?Sensation Testing ?Intact to touch and monofilament testing bilaterally: Yes ?Pulse Check ?Posterior Tibialis and Dorsalis pulse intact bilaterally: Yes ?Comments ?  ? ?Lab Results  ?Component Value Date  ? MICROALBUR <0.7 03/03/2021  ?  ?   ? ?Relevant past medical, surgical, family and social history reviewed and updated as indicated. Interim medical history since our last visit reviewed. ?Allergies and medications reviewed and updated. ?Outpatient Medications Prior to Visit  ?Medication Sig Dispense Refill  ? Blood  Glucose Monitoring Suppl (ONETOUCH VERIO IQ SYSTEM) w/Device KIT Check blood sugar twice a day and as directed. Dx E11.9 1 kit 0  ? Cholecalciferol (VITAMIN D3) 25 MCG (1000 UT) CAPS Take 1 capsule (1,000 Units total) by mouth daily. 30 capsule   ? Coenzyme Q10 (COQ10) 50 MG CAPS Take 1 capsule by mouth in the morning and at bedtime.    ? Dulaglutide (TRULICITY) 05.10MCH/8.5IDSOPN Inject 0.75 mg into the skin once a week. 6 mL 3  ? ezetimibe (ZETIA) 10 MG tablet Take 1 tablet (10 mg total) by mouth daily. 90 tablet 3  ? fluticasone (FLONASE) 50 MCG/ACT nasal spray Place 2 sprays into both nostrils daily. 16 g 0  ? glucose blood (ONETOUCH VERIO) test strip Check blood sugar twice a day. 200 strip 3  ? [START ON 09/02/2021] imiquimod (ALDARA) 5 % cream Apply topically 3 (three) times a week.  0  ? metFORMIN (GLUCOPHAGE-XR) 500 MG 24 hr tablet Take 2 tablets (1,000 mg total) by mouth in the morning and at bedtime. 360 tablet 3  ? ONETOUCH DELICA LANCETS 378EMISC Check blood sugar twice a day and as directed. Dx E11.9 100 each 6  ? Probiotic Product (PROBIOTIC PO) Take by mouth daily.    ? vitamin C (ASCORBIC ACID) 500 MG tablet Take 500 mg by mouth as needed.    ? ?No facility-administered medications prior to visit.  ?  ? ?Per HPI unless specifically indicated in ROS section below ?Review of Systems ? ?Objective:  ?  BP 134/66   Pulse 71   Temp 97.9 ?F (36.6 ?C) (Temporal)   Ht 5' 3.5" (1.613 m)   Wt 163 lb 4 oz (74 kg)   SpO2 97%   BMI 28.46 kg/m?   ?Wt Readings from Last 3 Encounters:  ?09/01/21 163 lb 4 oz (74 kg)  ?05/29/21 167 lb 8 oz (76 kg)  ?03/03/21 171 lb 1 oz (77.6 kg)  ?  ?  ?Physical Exam ?Vitals and nursing note reviewed.  ?Constitutional:   ?   Appearance: Normal appearance. She is not ill-appearing.  ?Eyes:  ?   Extraocular Movements: Extraocular movements intact.  ?   Conjunctiva/sclera: Conjunctivae normal.  ?   Pupils: Pupils are equal, round, and reactive to light.  ?Cardiovascular:  ?   Rate  and Rhythm: Normal rate and regular rhythm.  ?   Pulses: Normal pulses.  ?   Heart sounds: Murmur (2/6 systolic USB) heard.  ?Pulmonary:  ?   Effort: Pulmonary effort is normal. No respiratory distress.  ?   Breath sounds: Normal breath sounds. No wheezing, rhonchi or rales.  ?Musculoskeletal:  ?   Right lower leg: No edema.  ?   Left lower leg: No edema.  ?Skin: ?   General: Skin is warm and dry.  ?   Findings: No rash.  ?Neurological:  ?   Mental Status: She is alert.  ?Psychiatric:     ?   Mood and Affect: Mood normal.     ?   Behavior: Behavior normal.  ? ?   ?Results for orders placed or performed in visit on 09/01/21  ?POCT glycosylated hemoglobin (Hb A1C)  ?Result Value Ref Range  ? Hemoglobin A1C 7.1 (A) 4.0 - 5.6 %  ? HbA1c POC (<> result, manual entry)    ? HbA1c, POC (prediabetic range)    ? HbA1c, POC (controlled diabetic range)    ? ? ?Assessment & Plan:  ? ?Problem List Items Addressed This Visit   ? ? Type 2 diabetes mellitus with other specified complication ([place]) - Primary  ?  Chronic, improved but A1c still slightly elevated. Congratulated on sugar control and weight loss to date. Continue current regimen.  ? ?  ?  ? Overweight (BMI 25.0-29.9)  ?  Congratulated on weight loss to date.  ? ?  ?  ? Squamous cell cancer of scalp and skin of neck  ?  S/p Moh's 01/2021. ?Currently on imiquimod cream treatment.  ?Appreciate derm care.  ? ?  ?  ?  ? ?No orders of the defined types were placed in this encounter. ? ?Orders Placed This Encounter  ?Procedures  ? POCT glycosylated hemoglobin (Hb A1C)  ? ? ? ?Patient Instructions  ?Congrats on weight loss! ?A1c is improved but still slightly high at 7.1%.  ?Continue current medicines.  ?Return as needed or in 6 months for physical/wellness vist.  ? ?Follow up plan: ?Return in about 6 months (around 03/04/2022), or if symptoms worsen or fail to improve, for annual exam, prior fasting for blood work, medicare wellness visit. ? ?Ria Bush, MD   ?

## 2021-09-01 NOTE — Assessment & Plan Note (Signed)
S/p Moh's 01/2021. ?Currently on imiquimod cream treatment.  ?Appreciate derm care.  ?

## 2021-09-21 DIAGNOSIS — Z85828 Personal history of other malignant neoplasm of skin: Secondary | ICD-10-CM | POA: Diagnosis not present

## 2021-09-21 DIAGNOSIS — L570 Actinic keratosis: Secondary | ICD-10-CM | POA: Diagnosis not present

## 2021-12-10 DIAGNOSIS — H903 Sensorineural hearing loss, bilateral: Secondary | ICD-10-CM | POA: Diagnosis not present

## 2021-12-10 DIAGNOSIS — H905 Unspecified sensorineural hearing loss: Secondary | ICD-10-CM | POA: Diagnosis not present

## 2021-12-10 DIAGNOSIS — H6983 Other specified disorders of Eustachian tube, bilateral: Secondary | ICD-10-CM | POA: Diagnosis not present

## 2022-01-21 DIAGNOSIS — E119 Type 2 diabetes mellitus without complications: Secondary | ICD-10-CM | POA: Diagnosis not present

## 2022-01-21 LAB — HM DIABETES EYE EXAM

## 2022-01-25 ENCOUNTER — Other Ambulatory Visit: Payer: Self-pay | Admitting: Family Medicine

## 2022-01-25 DIAGNOSIS — H905 Unspecified sensorineural hearing loss: Secondary | ICD-10-CM | POA: Diagnosis not present

## 2022-01-25 DIAGNOSIS — H6983 Other specified disorders of Eustachian tube, bilateral: Secondary | ICD-10-CM | POA: Diagnosis not present

## 2022-02-01 ENCOUNTER — Encounter: Payer: Self-pay | Admitting: Family Medicine

## 2022-02-18 ENCOUNTER — Telehealth: Payer: Self-pay | Admitting: Family Medicine

## 2022-02-18 NOTE — Telephone Encounter (Signed)
LVM for pt to rtn my call to schedule AWV with [facility] call back # [phone] 

## 2022-02-22 ENCOUNTER — Other Ambulatory Visit: Payer: Self-pay | Admitting: Family Medicine

## 2022-02-22 DIAGNOSIS — Z1231 Encounter for screening mammogram for malignant neoplasm of breast: Secondary | ICD-10-CM

## 2022-02-26 ENCOUNTER — Ambulatory Visit (INDEPENDENT_AMBULATORY_CARE_PROVIDER_SITE_OTHER): Payer: PPO

## 2022-02-26 VITALS — Ht 63.5 in | Wt 155.0 lb

## 2022-02-26 DIAGNOSIS — Z0000 Encounter for general adult medical examination without abnormal findings: Secondary | ICD-10-CM

## 2022-02-26 NOTE — Progress Notes (Signed)
Subjective:   Betty Sanders is a 76 y.o. female who presents for Medicare Annual (Subsequent) preventive examination.  Review of Systems    No ROS.  Medicare Wellness Virtual Visit.  Visual/audio telehealth visit, UTA vital signs.   See social history for additional risk factors.   Cardiac Risk Factors include: advanced age (>56mn, >>47women);diabetes mellitus     Objective:    Today's Vitals   02/26/22 1233  Weight: 155 lb (70.3 kg)  Height: 5' 3.5" (1.613 m)   Body mass index is 27.03 kg/m.     02/26/2022   12:45 PM 05/21/2020    6:37 AM 05/07/2020    6:49 AM 01/16/2020    9:54 AM 01/10/2018    9:52 AM 06/03/2015   12:40 PM 05/20/2015    7:55 AM  Advanced Directives  Does Patient Have a Medical Advance Directive? Yes Yes Yes Yes No No No  Type of AParamedicof AGroveland StationLiving will HSebastianLiving will HAveryLiving will HCentral HighLiving will     Does patient want to make changes to medical advance directive? No - Patient declined No - Guardian declined No - Patient declined      Copy of HCalwain Chart? Yes - validated most recent copy scanned in chart (See row information) No - copy requested No - copy requested No - copy requested     Would patient like information on creating a medical advance directive?     Yes (MAU/Ambulatory/Procedural Areas - Information given) No - patient declined information     Current Medications (verified) Outpatient Encounter Medications as of 02/26/2022  Medication Sig   Blood Glucose Monitoring Suppl (ONETOUCH VERIO IQ SYSTEM) w/Device KIT Check blood sugar twice a day and as directed. Dx E11.9   Cholecalciferol (VITAMIN D3) 25 MCG (1000 UT) CAPS Take 1 capsule (1,000 Units total) by mouth daily.   Coenzyme Q10 (COQ10) 50 MG CAPS Take 1 capsule by mouth in the morning and at bedtime.   Dulaglutide (TRULICITY) 05.83MEN/4.0HWSOPN  Inject 0.75 mg into the skin once a week.   ezetimibe (ZETIA) 10 MG tablet TAKE 1 TABLET BY MOUTH EVERY DAY   fluticasone (FLONASE) 50 MCG/ACT nasal spray Place 2 sprays into both nostrils daily.   glucose blood (ONETOUCH VERIO) test strip Check blood sugar twice a day.   imiquimod (ALDARA) 5 % cream Apply topically 3 (three) times a week.   metFORMIN (GLUCOPHAGE-XR) 500 MG 24 hr tablet Take 2 tablets (1,000 mg total) by mouth in the morning and at bedtime.   ONETOUCH DELICA LANCETS 380SMISC Check blood sugar twice a day and as directed. Dx E11.9   Probiotic Product (PROBIOTIC PO) Take by mouth daily.   vitamin C (ASCORBIC ACID) 500 MG tablet Take 500 mg by mouth as needed.   No facility-administered encounter medications on file as of 02/26/2022.    Allergies (verified) Lipitor [atorvastatin], Pravastatin, and Sulfa antibiotics   History: Past Medical History:  Diagnosis Date   Bilateral hearing loss 2016   hearing aides   Diabetes mellitus without complication ([place]    Dyslipidemia    Heart murmur    Hyperlipidemia    [facility]  Squamous cell cancer of scalp and skin of neck 2013   s/p excision (Dr. WJimmye Norman   Squamous cell cancer of scalp and skin of neck 01/2021   s/p Mohs ([facility] CL[facility]inda Axon  [facility]), R superior anterior scalp 04/2021 with base involved   Type 2 diabetes mellitus, uncontrolled 02/2012   DSME completed 07/2014   Wears hearing aid in both ears    Past Surgical History:  Procedure Laterality Date   BELPHAROPTOSIS REPAIR Bilateral 11/2016   CATARACT EXTRACTION W/PHACO Left 05/07/2020   Procedure: CATARACT EXTRACTION PHACO AND INTRAOCULAR LENS PLACEMENT (IOC) LEFT DIABETIC 8.70 01:28.6 9.8%;  Surgeon: Leandrew Koyanagi, MD;  Location: [facility];  Service: Ophthalmology;  Laterality: Left;  Diabetic - oral meds   CATARACT EXTRACTION W/PHACO Right 05/21/2020   Procedure: CATARACT EXTRACTION PHACO AND INTRAOCULAR LENS PLACEMENT  (IOC) RIGHT DIABETIC 4.82 00:45.2 10.6%;  Surgeon: Leandrew Koyanagi, MD;  Location: [facility];  Service: Ophthalmology;  Laterality: Right;   COLONOSCOPY  02/2010   tubular adenoma polyp x 1; repeat 5 years--Dr. Carlean Purl   COLONOSCOPY  05/2015   diverticulosis, rpt 10 yrs Carlean Purl)   DENTAL SURGERY  October 19, 2017   dental implants   DEXA  05/2014   spine -2.1, hip -1.4   GANGLION CYST EXCISION  2006   Left wrist   MOHS SURGERY  02/09/2021   squamous cell of frontal scalp (Dr Lacinda Axon at [facility])   RHINOPLASTY  1970's   WRIST SURGERY     Family History  Problem Relation Age of Onset   Lung cancer Father    Colon cancer Cousin    Breast cancer Paternal Aunt    Cervical cancer Maternal Aunt 43       female cancer   Diabetes Maternal Grandfather    Stroke Paternal Grandfather    CAD Neg Hx    Social History   Socioeconomic History   Marital status: Widowed    Spouse name: Not on file   Number of children: Not on file   Years of education: Not on file   Highest education level: Not on file  Occupational History   Occupation: Scientist, research (medical)    Employer: [employer]  Tobacco Use   Smoking status: Former    Types: Cigarettes    Quit date: 04/19/1968    Years since quitting: 53.8   Smokeless tobacco: Never  Vaping Use   Vaping Use: Never used  Substance and Sexual Activity   Alcohol use: Yes    Alcohol/week: 0.0 standard drinks of alcohol    Comment: Rare 1 glass wine per month   Drug use: No   Sexual activity: Not on file  Other Topics Concern   Not on file  Social History Narrative   Widower - husband passed away 2016/10/19 (amyloid)   Has 1 dog ([place])   Occupation: retired, 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   Activity: kayak, restarting treadmill, wears fit bit - wants to get to 10000steps/day   Diet: My.fitness.pal 1200cal/day, good water, fruits/vegetables daily   Social Determinants of Health   Financial Resource Strain: Low  Risk  (02/26/2022)   Overall Financial Resource Strain (CARDIA)    Difficulty of Paying Living Expenses: Not hard at all  Food Insecurity: No Food Insecurity (02/26/2022)   Hunger Vital Sign    Worried About Running Out of Food in the Last Year: Never true    Ran Out of Food in the Last Year: Never true  Transportation Needs: No Transportation Needs (02/26/2022)   PRAPARE - Hydrologist (Medical): No    Lack of Transportation (Non-Medical): No  Physical Activity: Sufficiently Active (02/26/2022)   Exercise Vital  Sign    Days of Exercise per Week: 7 days    Minutes of Exercise per Session: 60 min  Stress: No Stress Concern Present (02/26/2022)   [place]    Feeling of Stress : Not at all  Social Connections: Unknown (02/26/2022)   Social Connection and Isolation Panel [NHANES]    Frequency of Communication with Friends and Family: More than three times a week    Frequency of Social Gatherings with Friends and Family: More than three times a week    Attends Religious Services: Not on Advertising copywriter or Organizations: Not on file    Attends Archivist Meetings: Not on file    Marital Status: Not on file    Tobacco Counseling Counseling given: Not Answered   Clinical Intake:  Pre-visit preparation completed: Yes        Diabetes: Yes (Followed by PCP)  Nutrition Risk Assessment: Does the patient have any non-healing wounds?  No  Has the patient had any unintentional weight loss or weight gain?  No   Diabetes: Is the patient diabetic?  Yes  If diabetic, was a CBG obtained today?  Yes , FBS 132 Did the patient bring in their glucometer from home?  No  How often do you monitor your CBG's? daily.   Financial Strains and Diabetes Management: Are you having any financial strains with the device, your supplies or your medication? No .  Does the patient  want to be seen by Chronic Care Management for management of their diabetes?  No  Would the patient like to be referred to a Nutritionist or for Diabetic Management?  No   How often do you need to have someone help you when you read instructions, pamphlets, or other written materials from your doctor or pharmacy?: 1 - Never    Interpreter Needed?: No      Activities of Daily Living    02/26/2022   12:37 PM  In your present state of health, do you have any difficulty performing the following activities:  Hearing? 1  Comment Hearing aid  Vision? 0  Difficulty concentrating or making decisions? 0  Walking or climbing stairs? 1  Comment R knee/sided chronic pain. Wears braces as needed. Paces self with activity.  Dressing or bathing? 0  Doing errands, shopping? 0  Preparing Food and eating ? N  Using the Toilet? N  In the past six months, have you accidently leaked urine? N  Do you have problems with loss of bowel control? N  Managing your Medications? N  Managing your Finances? N  Housekeeping or managing your Housekeeping? N    Patient Care Team: Ria Bush, MD as PCP - General Debbora Dus, [facility] as Pharmacist (Pharmacist)  Indicate any recent Medical Services you may have received from other than [facility] providers in the past year (date may be approximate).     Assessment:   This is a routine wellness examination for [place].  I connected with  Betty Sanders on 02/26/22 by a audio enabled telemedicine application and verified that I am speaking with the correct person using two identifiers.  Patient Location: Home  Provider Location: Office/Clinic  I discussed the limitations of evaluation and management by telemedicine. The patient expressed understanding and agreed to proceed.   Hearing/Vision screen Hearing Screening - Comments:: Followed by [facility] Hearing aid, bilateral   Vision Screening - Comments:: Followed by [facility]  Wears  corrective lenses No retinopathy reported Cataract extraction, bilateral They have seen their ophthalmologist in the last 12 months.    Dietary issues and exercise activities discussed: Current Exercise Habits: Home exercise routine, Type of exercise: calisthenics;walking (Bicycle riding), Frequency (Times/Week): 7, Intensity: Mild   Goals Addressed               This Visit's Progress     Patient Stated     Weight goal 145lb-150lb (pt-stated)        Stay active  Healthy diet Good water intake       Depression Screen    02/26/2022   12:44 PM 03/03/2021   12:32 PM 01/16/2020   10:03 AM 01/17/2019   10:19 AM 01/10/2018    9:51 AM 10/29/2013   10:22 PM  PHQ 2/9 Scores  PHQ - 2 Score 0 1 0 0 1 0  PHQ- 9 Score  4 0  2     Fall Risk    02/26/2022   12:44 PM 03/03/2021   11:47 AM 01/16/2020    9:56 AM 11/13/2019    4:27 PM 01/17/2019   10:19 AM  Fall Risk   Falls in the past year? 0 0 0 0 0  Comment    Emmi Telephone Survey: data to providers prior to load   Number falls in past yr: 0  0    Injury with Fall? 0  0    Risk for fall due to :   No Fall Risks    Follow up Falls evaluation completed;Falls prevention discussed  Falls evaluation completed;Falls prevention discussed      FALL RISK PREVENTION PERTAINING TO THE HOME: Home free of loose throw rugs in walkways, pet beds, electrical cords, etc? Yes  Adequate lighting in your home to reduce risk of falls? Yes   ASSISTIVE DEVICES UTILIZED TO PREVENT FALLS: Life watch alert? No  Use of a cane, walker or w/c? No  Grab bars in the bathroom? No  Shower chair or bench in shower? No  Elevated toilet seat or a handicapped toilet? No   TIMED UP AND GO: Was the test performed? No .   Cognitive Function:    01/16/2020   10:05 AM 01/10/2018    9:51 AM  MMSE - Mini Mental State Exam  Orientation to time 5 5  Orientation to Place 5 5  Registration 3 3  Attention/ Calculation 5 0  Recall 3 3  Language- name 2  objects  0  Language- repeat 1 1  Language- follow 3 step command  3  Language- read & follow direction  0  Write a sentence  0  Copy design  0  Total score  20        02/26/2022   12:49 PM  6CIT Screen  What Year? 0 points  What month? 0 points  What time? 0 points  Count back from 20 0 points  Months in reverse 0 points  Repeat phrase 0 points  Total Score 0 points    Immunizations Immunization History  Administered Date(s) Administered   COVID-19, mRNA, vaccine(Comirnaty)12 years and older 01/25/2022   Fluad Quad(high Dose 65+) 01/17/2019, 01/27/2021   Influenza Split 02/09/2012   Influenza Whole 01/17/2013   Influenza, High Dose Seasonal PF 01/18/2020, 01/25/2022   Influenza, Seasonal, Injecte, Preservative Fre 01/13/2015   Influenza,inj,Quad PF,6+ Mos 01/18/2014, 01/10/2018   Influenza-Unspecified 01/18/2016, 01/26/2017   PFIZER Comirnaty(Gray Top)Covid-19 Tri-Sucrose Vaccine 10/23/2020   PFIZER(Purple Top)SARS-COV-2 Vaccination 05/26/2019, 06/20/2019,  02/18/2020   Pfizer Covid-19 Vaccine Bivalent Booster 93yr & up 01/27/2021   Pneumococcal Conjugate-13 09/12/2014   Pneumococcal Polysaccharide-23 02/14/2013   Td 10/02/2007   Tdap 01/11/2018, 10/13/2018   Zoster Recombinat (Shingrix) 10/15/2019, 12/27/2019   Covid-19 vaccine status: Completed vaccines x5.  Screening Tests Health Maintenance  Topic Date Due   Diabetic kidney evaluation - GFR measurement  03/03/2022   Diabetic kidney evaluation - Urine ACR  03/03/2022   COVID-19 Vaccine (6 - Pfizer risk series) 03/14/2022 (Originally 03/24/2021)   HEMOGLOBIN A1C  03/04/2022   MAMMOGRAM  04/03/2022   FOOT EXAM  09/02/2022   OPHTHALMOLOGY EXAM  01/22/2023   Medicare Annual Wellness (AWV)  02/27/2023   COLONOSCOPY (Pts 45-467yrInsurance coverage will need to be confirmed)  06/02/2025   TETANUS/TDAP  10/12/2028   Pneumonia Vaccine 6544Years old  Completed   INFLUENZA VACCINE  Completed   DEXA SCAN  Completed    Hepatitis C Screening  Completed   Zoster Vaccines- Shingrix  Completed   HPV VACCINES  Aged Out   Health Maintenance Health Maintenance Due  Topic Date Due   Diabetic kidney evaluation - GFR measurement  03/03/2022   Diabetic kidney evaluation - Urine ACR  03/03/2022   Lung Cancer Screening: (Low Dose CT Chest recommended if Age 534-80ears, 30 pack-year currently smoking OR have quit w/in 15years.) does not qualify.   Hepatitis C Screening: Completed 2017.  Vision Screening: Recommended annual ophthalmology exams for early detection of glaucoma and other disorders of the eye.  Dental Screening: Recommended annual dental exams for proper oral hygiene.  Community Resource Referral / Chronic Care Management: CRR required this visit?  No   CCM required this visit?  No      Plan:     I have personally reviewed and noted the following in the patient's chart:   Medical and social history Use of alcohol, tobacco or illicit drugs  Current medications and supplements including opioid prescriptions. Patient is not currently taking opioid prescriptions. Functional ability and status Nutritional status Physical activity Advanced directives List of other physicians Hospitalizations, surgeries, and ER visits in previous 12 months Vitals Screenings to include cognitive, depression, and falls Referrals and appointments  In addition, I have reviewed and discussed with patient certain preventive protocols, quality metrics, and best practice recommendations. A written personalized care plan for preventive services as well as general preventive health recommendations were provided to patient.     DeLeta JunglingLPN   [id number]

## 2022-02-26 NOTE — Patient Instructions (Addendum)
Betty Sanders , Thank you for taking time to come for your Medicare Wellness Visit. I appreciate your ongoing commitment to your health goals. Please review the following plan we discussed and let me know if I can assist you in the future.   These are the goals we discussed:  Goals       Patient Stated     Weight goal 145lb-150lb (pt-stated)      Stay active  Healthy diet Good water intake      Other     DIET - INCREASE WATER INTAKE      Starting 01/10/2018, I will continue to drink 2-3 liters of water daily.       Patient Stated      01/16/2020, I will continue to walk my dog everyday for 2-5 miles.      Pharmacy Care Plan      CARE PLAN ENTRY (see longitudinal plan of care for additional care plan information)  Current Barriers:  Chronic Disease Management support, education, and care coordination needs related to Hyperlipidemia and Diabetes  Hyperlipidemia Lab Results  Component Value Date/Time   LDLCALC 127 (H) 01/16/2020 07:40 AM   LDLDIRECT 69.0 04/28/2016 08:08 AM  Pharmacist Clinical Goal(s): Over the next 3 months, patient will work with PharmD and providers to achieve LDL goal < 100 Current regimen:  Rosuvastatin 5 mg - 1 tablet twice weekly (not taking) CoQ10 50 mg - 1 tablet daily Interventions: We discussed vitamin D deficiency may increase intolerance of statin. Recommend vitamin D 1000 IU daily considering bone health and previous vitamin D at low end of normal. She will start vitamin D, then try rosuvastatin once weekly and increase to twice weekly after 2 weeks if tolerated. Reports increased exercise and better diet over the past year. Patient self care activities - Over the next 3 months, patient will: Start vitamin D 1000 IU daily Resume rosuvastatin - start with once weekly then increase to twice weekly after 2 weeks if tolerated  Diabetes Lab Results  Component Value Date/Time   HGBA1C 7.4 (H) 01/16/2020 07:40 AM   HGBA1C 6.4 07/11/2019 08:10 AM   Pharmacist Clinical Goal(s): Over the next 3 months, patient will work with PharmD and providers to achieve A1c goal <7% Current regimen:  Glimepiride 1 mg (Amaryl) - 1 tablet daily Metformin 500 mg - 2 tablets daily with breakfast  Interventions: Consult PCP to consider increasing metformin to 3 tablets daily (1500 mg total) and extended release to improve morning BG readings.  Patient self care activities - Over the next 3 months, patient will: Check blood sugar once daily, document, and provide at future appointments Continue current medications until further contact from clinic Contact provider with any episodes of hypoglycemia  Initial goal documentation        This is a list of the screening recommended for you and due dates:  Health Maintenance  Topic Date Due   Yearly kidney function blood test for diabetes  03/03/2022   Yearly kidney health urinalysis for diabetes  03/03/2022   COVID-19 Vaccine (6 - Pfizer risk series) 03/14/2022*   Hemoglobin A1C  03/04/2022   Mammogram  04/03/2022   Complete foot exam   09/02/2022   Eye exam for diabetics  01/22/2023   Medicare Annual Wellness Visit  02/27/2023   Colon Cancer Screening  06/02/2025   Tetanus Vaccine  10/12/2028   Pneumonia Vaccine  Completed   Flu Shot  Completed   DEXA scan (bone density measurement)  Completed  Hepatitis C Screening: USPSTF Recommendation to screen - Ages 64-79 yo.  Completed   Zoster (Shingles) Vaccine  Completed   HPV Vaccine  Aged Out  *Topic was postponed. The date shown is not the original due date.    Advanced directives: on file  Conditions/risks identified: none new  Next appointment: Follow up in one year for your annual wellness visit    Preventive Care 65 Years and Older, Female Preventive care refers to lifestyle choices and visits with your health care provider that can promote health and wellness. What does preventive care include? A yearly physical exam. This is also  called an annual well check. Dental exams once or twice a year. Routine eye exams. Ask your health care provider how often you should have your eyes checked. Personal lifestyle choices, including: Daily care of your teeth and gums. Regular physical activity. Eating a healthy diet. Avoiding tobacco and drug use. Limiting alcohol use. Practicing safe sex. Taking low-dose aspirin every day. Taking vitamin and mineral supplements as recommended by your health care provider. What happens during an annual well check? The services and screenings done by your health care provider during your annual well check will depend on your age, overall health, lifestyle risk factors, and family history of disease. Counseling  Your health care provider may ask you questions about your: Alcohol use. Tobacco use. Drug use. Emotional well-being. Home and relationship well-being. Sexual activity. Eating habits. History of falls. Memory and ability to understand (cognition). Work and work Statistician. Reproductive health. Screening  You may have the following tests or measurements: Height, weight, and BMI. Blood pressure. Lipid and cholesterol levels. These may be checked every 5 years, or more frequently if you are over 44 years old. Skin check. Lung cancer screening. You may have this screening every year starting at age 42 if you have a 30-pack-year history of smoking and currently smoke or have quit within the past 15 years. Fecal occult blood test (FOBT) of the stool. You may have this test every year starting at age 44. Flexible sigmoidoscopy or colonoscopy. You may have a sigmoidoscopy every 5 years or a colonoscopy every 10 years starting at age 106. Hepatitis C blood test. Hepatitis B blood test. Sexually transmitted disease (STD) testing. Diabetes screening. This is done by checking your blood sugar (glucose) after you have not eaten for a while (fasting). You may have this done every 1-3  years. Bone density scan. This is done to screen for osteoporosis. You may have this done starting at age 9. Mammogram. This may be done every 1-2 years. Talk to your health care provider about how often you should have regular mammograms. Talk with your health care provider about your test results, treatment options, and if necessary, the need for more tests. Vaccines  Your health care provider may recommend certain vaccines, such as: Influenza vaccine. This is recommended every year. Tetanus, diphtheria, and acellular pertussis (Tdap, Td) vaccine. You may need a Td booster every 10 years. Zoster vaccine. You may need this after age 34. Pneumococcal 13-valent conjugate (PCV13) vaccine. One dose is recommended after age 74. Pneumococcal polysaccharide (PPSV23) vaccine. One dose is recommended after age 89. Talk to your health care provider about which screenings and vaccines you need and how often you need them. This information is not intended to replace advice given to you by your health care provider. Make sure you discuss any questions you have with your health care provider. Document Released: 05/02/2015 Document Revised: 12/24/2015 Document  Reviewed: 02/04/2015 Elsevier Interactive Patient Education  2017 [place] Prevention in the Home Falls can cause injuries. They can happen to people of all ages. There are many things you can do to make your home safe and to help prevent falls. What can I do on the outside of my home? Regularly fix the edges of walkways and driveways and fix any cracks. Remove anything that might make you trip as you walk through a door, such as a raised step or threshold. Trim any bushes or trees on the path to your home. Use bright outdoor lighting. Clear any walking paths of anything that might make someone trip, such as rocks or tools. Regularly check to see if handrails are loose or broken. Make sure that both sides of any steps have  handrails. Any raised decks and porches should have guardrails on the edges. Have any leaves, snow, or ice cleared regularly. Use sand or salt on walking paths during winter. Clean up any spills in your garage right away. This includes oil or grease spills. What can I do in the bathroom? Use night lights. Install grab bars by the toilet and in the tub and shower. Do not use towel bars as grab bars. Use non-skid mats or decals in the tub or shower. If you need to sit down in the shower, use a plastic, non-slip stool. Keep the floor dry. Clean up any water that spills on the floor as soon as it happens. Remove soap buildup in the tub or shower regularly. Attach bath mats securely with double-sided non-slip rug tape. Do not have throw rugs and other things on the floor that can make you trip. What can I do in the bedroom? Use night lights. Make sure that you have a light by your bed that is easy to reach. Do not use any sheets or blankets that are too big for your bed. They should not hang down onto the floor. Have a firm chair that has side arms. You can use this for support while you get dressed. Do not have throw rugs and other things on the floor that can make you trip. What can I do in the kitchen? Clean up any spills right away. Avoid walking on wet floors. Keep items that you use a lot in easy-to-reach places. If you need to reach something above you, use a strong step stool that has a grab bar. Keep electrical cords out of the way. Do not use floor polish or wax that makes floors slippery. If you must use wax, use non-skid floor wax. Do not have throw rugs and other things on the floor that can make you trip. What can I do with my stairs? Do not leave any items on the stairs. Make sure that there are handrails on both sides of the stairs and use them. Fix handrails that are broken or loose. Make sure that handrails are as long as the stairways. Check any carpeting to make sure  that it is firmly attached to the stairs. Fix any carpet that is loose or worn. Avoid having throw rugs at the top or bottom of the stairs. If you do have throw rugs, attach them to the floor with carpet tape. Make sure that you have a light switch at the top of the stairs and the bottom of the stairs. If you do not have them, ask someone to add them for you. What else can I do to help prevent falls? Wear shoes that: Do  not have high heels. Have rubber bottoms. Are comfortable and fit you well. Are closed at the toe. Do not wear sandals. If you use a stepladder: Make sure that it is fully opened. Do not climb a closed stepladder. Make sure that both sides of the stepladder are locked into place. Ask someone to hold it for you, if possible. Clearly mark and make sure that you can see: Any grab bars or handrails. First and last steps. Where the edge of each step is. Use tools that help you move around (mobility aids) if they are needed. These include: Canes. Walkers. Scooters. Crutches. Turn on the lights when you go into a dark area. Replace any light bulbs as soon as they burn out. Set up your furniture so you have a clear path. Avoid moving your furniture around. If any of your floors are uneven, fix them. If there are any pets around you, be aware of where they are. Review your medicines with your doctor. Some medicines can make you feel dizzy. This can increase your chance of falling. Ask your doctor what other things that you can do to help prevent falls. This information is not intended to replace advice given to you by your health care provider. Make sure you discuss any questions you have with your health care provider. Document Released: 01/30/2009 Document Revised: 09/11/2015 Document Reviewed: 05/10/2014 Elsevier Interactive Patient Education  2017 [employer].

## 2022-02-28 ENCOUNTER — Other Ambulatory Visit: Payer: Self-pay | Admitting: Family Medicine

## 2022-02-28 DIAGNOSIS — M85852 Other specified disorders of bone density and structure, left thigh: Secondary | ICD-10-CM

## 2022-02-28 DIAGNOSIS — E1169 Type 2 diabetes mellitus with other specified complication: Secondary | ICD-10-CM

## 2022-02-28 DIAGNOSIS — K760 Fatty (change of) liver, not elsewhere classified: Secondary | ICD-10-CM

## 2022-03-01 ENCOUNTER — Other Ambulatory Visit (INDEPENDENT_AMBULATORY_CARE_PROVIDER_SITE_OTHER): Payer: PPO

## 2022-03-01 DIAGNOSIS — E1169 Type 2 diabetes mellitus with other specified complication: Secondary | ICD-10-CM

## 2022-03-01 DIAGNOSIS — K760 Fatty (change of) liver, not elsewhere classified: Secondary | ICD-10-CM | POA: Diagnosis not present

## 2022-03-01 DIAGNOSIS — M85852 Other specified disorders of bone density and structure, left thigh: Secondary | ICD-10-CM

## 2022-03-01 DIAGNOSIS — E785 Hyperlipidemia, unspecified: Secondary | ICD-10-CM

## 2022-03-01 LAB — CBC WITH DIFFERENTIAL/PLATELET
Basophils Absolute: 0 10*3/uL (ref 0.0–0.1)
Basophils Relative: 0.7 % (ref 0.0–3.0)
Eosinophils Absolute: 0.1 10*3/uL (ref 0.0–0.7)
Eosinophils Relative: 1.5 % (ref 0.0–5.0)
HCT: 38.4 % (ref 36.0–46.0)
Hemoglobin: 12.7 g/dL (ref 12.0–15.0)
Lymphocytes Relative: 40.9 % (ref 12.0–46.0)
Lymphs Abs: 2.4 10*3/uL (ref 0.7–4.0)
MCHC: 33 g/dL (ref 30.0–36.0)
MCV: 77.2 fl — ABNORMAL LOW (ref 78.0–100.0)
Monocytes Absolute: 0.4 10*3/uL (ref 0.1–1.0)
Monocytes Relative: 6.7 % (ref 3.0–12.0)
Neutro Abs: 3 10*3/uL (ref 1.4–7.7)
Neutrophils Relative %: 50.2 % (ref 43.0–77.0)
Platelets: 331 10*3/uL (ref 150.0–400.0)
RBC: 4.97 Mil/uL (ref 3.87–5.11)
RDW: 15.2 % (ref 11.5–15.5)
WBC: 5.9 10*3/uL (ref 4.0–10.5)

## 2022-03-01 LAB — COMPREHENSIVE METABOLIC PANEL
ALT: 15 U/L (ref 0–35)
AST: 18 U/L (ref 0–37)
Albumin: 4.5 g/dL (ref 3.5–5.2)
Alkaline Phosphatase: 67 U/L (ref 39–117)
BUN: 15 mg/dL (ref 6–23)
CO2: 27 mEq/L (ref 19–32)
Calcium: 9.5 mg/dL (ref 8.4–10.5)
Chloride: 100 mEq/L (ref 96–112)
Creatinine, Ser: 0.69 mg/dL (ref 0.40–1.20)
GFR: 85.63 mL/min (ref >60.00)
Glucose, Bld: 141 mg/dL — ABNORMAL HIGH (ref 70–99)
Potassium: 4.2 mEq/L (ref 3.5–5.1)
Sodium: 137 mEq/L (ref 135–145)
Total Bilirubin: 0.5 mg/dL (ref 0.2–1.2)
Total Protein: 7.1 g/dL (ref 6.0–8.3)

## 2022-03-01 LAB — MICROALBUMIN / CREATININE URINE RATIO
Creatinine,U: 55.8 mg/dL
Microalb Creat Ratio: 1.3 mg/g (ref 0.0–30.0)
Microalb, Ur: <0.7 mg/dL (ref 0.0–1.9)

## 2022-03-01 LAB — LIPID PANEL
Cholesterol: 187 mg/dL (ref 0–200)
HDL: 48.8 mg/dL (ref >39.00)
LDL Cholesterol: 110 mg/dL — ABNORMAL HIGH (ref 0–99)
NonHDL: 138.47
Total CHOL/HDL Ratio: 4
Triglycerides: 141 mg/dL (ref 0.0–149.0)
VLDL: 28.2 mg/dL (ref 0.0–40.0)

## 2022-03-01 LAB — VITAMIN D 25 HYDROXY (VIT D DEFICIENCY, FRACTURES): VITD: 48.58 ng/mL (ref 30.00–100.00)

## 2022-03-01 LAB — HEMOGLOBIN A1C: Hgb A1c MFr Bld: 6.8 % — ABNORMAL HIGH (ref 4.6–6.5)

## 2022-03-04 ENCOUNTER — Encounter: Payer: Self-pay | Admitting: Pharmacist

## 2022-03-04 NOTE — Progress Notes (Signed)
[facility])                                            [place] Team                                        Statin Quality Measure Assessment    03/04/2022  Betty Sanders Jun 17, 1945 [id number]   Per review of chart and payor information, patient has a diagnosis of diabetes but is not currently filling a statin prescription.  This places patient into the SUPD (Statin Use In Patients with Diabetes) measure for CMS.    Patient has documented trials of atorvastatin and pravastatin with reported muscle related intolerances, but no corresponding CPT codes that would exclude patient from SUPD measure.  The ASCVD Risk score (Arnett DK, et al., 2019) failed to calculate for the following reasons:   The systolic blood pressure is missing 03/01/2022     Component Value Date/Time   CHOL 187 03/01/2022 0723   TRIG 141.0 03/01/2022 0723   HDL 48.80 03/01/2022 0723   CHOLHDL 4 03/01/2022 0723   VLDL 28.2 03/01/2022 0723   LDLCALC 110 (H) 03/01/2022 0723   LDLDIRECT 69.0 04/28/2016 0808    Please consider ONE of the following recommendations:  Initiate high intensity statin Atorvastatin '40mg'$  once daily, #90, 3 refills   Rosuvastatin '20mg'$  once daily, #90, 3 refills    Initiate moderate intensity          statin with reduced frequency if prior          statin intolerance 1x weekly, #13, 3 refills   2x weekly, #26, 3 refills   3x weekly, #39, 3 refills    Code for past statin intolerance or  other exclusions (required annually)  Provider Requirements: Assess prior statin intolerance and associate code during an office visit or audiovisual encounter  Drug Induced Myopathy G72.0   Myopathy, unspecified G72.9   Myositis, unspecified M60.9   Rhabdomyolysis M62.82   Cirrhosis of liver K74.69   Prediabetes R73.03   PCOS E28.2    Loretha Brasil, PharmD [place]  Pharmacist Office: [phone]

## 2022-03-08 ENCOUNTER — Ambulatory Visit (INDEPENDENT_AMBULATORY_CARE_PROVIDER_SITE_OTHER): Payer: PPO | Admitting: Family Medicine

## 2022-03-08 ENCOUNTER — Encounter: Payer: Self-pay | Admitting: Family Medicine

## 2022-03-08 VITALS — BP 124/68 | HR 69 | Temp 97.6°F | Ht 65.0 in | Wt 159.0 lb

## 2022-03-08 DIAGNOSIS — Z789 Other specified health status: Secondary | ICD-10-CM

## 2022-03-08 DIAGNOSIS — Z0000 Encounter for general adult medical examination without abnormal findings: Secondary | ICD-10-CM

## 2022-03-08 DIAGNOSIS — K760 Fatty (change of) liver, not elsewhere classified: Secondary | ICD-10-CM

## 2022-03-08 DIAGNOSIS — C4442 Squamous cell carcinoma of skin of scalp and neck: Secondary | ICD-10-CM | POA: Diagnosis not present

## 2022-03-08 DIAGNOSIS — E1169 Type 2 diabetes mellitus with other specified complication: Secondary | ICD-10-CM

## 2022-03-08 DIAGNOSIS — E785 Hyperlipidemia, unspecified: Secondary | ICD-10-CM

## 2022-03-08 DIAGNOSIS — R011 Cardiac murmur, unspecified: Secondary | ICD-10-CM | POA: Diagnosis not present

## 2022-03-08 NOTE — Assessment & Plan Note (Signed)
Preventative protocols reviewed and updated unless pt declined. Discussed healthy diet and lifestyle.  

## 2022-03-08 NOTE — Progress Notes (Signed)
Patient ID: Betty Sanders, female    DOB: [birth year], 76 y.o.   MRN: [id number]  This visit was conducted in person.  BP 124/68   Pulse 69   Temp 97.6 F (36.4 C)   Ht _0  (1.651 m)   Wt 159 lb (72.1 kg)   SpO2 98%   BMI 26.46 kg/m    CC: CPE Subjective:   HPI: Betty Sanders is a 76 y.o. female presenting on 03/08/2022 for Annual Exam   Saw health advisor last week for medicare wellness visit. Note reviewed.    No results found.  Flowsheet Row Clinical Support from 02/26/2022 in [place] [facility] [place]  PHQ-2 Total Score 0          02/26/2022   12:44 PM 03/03/2021   11:47 AM 01/16/2020    9:56 AM 11/13/2019    4:27 PM 01/17/2019   10:19 AM  Fall Risk   Falls in the past year? 0 0 0 0 0  Comment    Emmi Telephone Survey: data to providers prior to load   Number falls in past yr: 0  0    Injury with Fall? 0  0    Risk for fall due to :   No Fall Risks    Follow up Falls evaluation completed;Falls prevention discussed  Falls evaluation completed;Falls prevention discussed     Notes ongoing hearing difficulty coming from sinuses - has been seeing ENT and audiology.   Squamous cell cancer to mid front scalp s/p Moh's 01/2021.   HLD - continues zetia 90m daily. She stopped crestor 6 months ago - stopped due to myalgias.   DM - continues  metformin XR 10043mbid and trulciity 0.7551meekly. Has stopped amaryl 1/2 tab. Fasting this morning 109. Recent steroid course for sinuses.   Preventative: COLONOSCOPY Date: 05/2015 diverticulosis, rpt 10 yrs ([facility] 03/2021, [facility] 1 @ [place]. Does breast exams at home.  Well woman exam - always normal. Last 10/2016. Fmhx (aunt) GYN cancer, unsure type. Will age out. no pelvic pain, vaginal bleeding, skin changes, abd pain or bloating, bowel changes.  DEXA (03/2020) T -1.7 (L femur). Good yogurt and ice cream and weight bearing exercise.  Flu shot yearly  [place], [place],  Pfizer booster 02/2020, 10/2020, bivalent 01/2021, 01/2022  Td 2009, Tdap 2019, 2020 Pneumovax 01/2013, [id number] Shingrix - completed 09/2019, 12/2019  Advanced planning - scanned into chart 2022. Sister Betty Sanders HCPOA. does not want prolonged life support if terminal condition. Wants HCPOA recommendations followed.  Seat belt use discussed Sunscreen use discussed - no changing moles on skin. 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 Interim medical history since our last visit reviewed. Allergies and medications reviewed and updated. Outpatient Medications Prior to Visit  Medication Sig Dispense Refill   Blood Glucose Monitoring Suppl (ONETOUCH VERIO IQ SYSTEM) w/Device KIT Check blood sugar twice a day and as directed. Dx E11.9 1 kit 0   Cholecalciferol (VITAMIN D3) 25 MCG (  1000 UT) CAPS Take 1 capsule (1,000 Units total) by mouth daily. 30 capsule    Coenzyme Q10 (COQ10) 50 MG CAPS Take 1 capsule by mouth in the morning and at bedtime.     Dulaglutide (TRULICITY) 5.28 UX/3.2GM SOPN Inject 0.75 mg into the skin once a week. 6 mL 3   ezetimibe (ZETIA) 10 MG tablet TAKE 1 TABLET BY MOUTH EVERY DAY 90 tablet 0   glucose blood (ONETOUCH VERIO) test strip Check blood sugar twice a day. 200 strip 3   metFORMIN (GLUCOPHAGE-XR) 500 MG 24 hr tablet Take 2 tablets (1,000 mg total) by mouth in the morning and at bedtime. 360 tablet 3   ONETOUCH DELICA LANCETS 01U MISC Check blood sugar twice a day and as directed. Dx E11.9  100 each 6   Probiotic Product (PROBIOTIC PO) Take by mouth daily.     triamcinolone (NASACORT) 55 MCG/ACT AERO nasal inhaler 2 sprays 2 (two) times daily.     vitamin C (ASCORBIC ACID) 500 MG tablet Take 500 mg by mouth as needed.     fluticasone (FLONASE) 50 MCG/ACT nasal spray Place 2 sprays into both nostrils daily. 16 g 0   imiquimod (ALDARA) 5 % cream Apply topically 3 (three) times a week.  0   No facility-administered medications prior to visit.     Per HPI unless specifically indicated in ROS section below Review of Systems  Constitutional:  Negative for activity change, appetite change, chills, fatigue, fever and unexpected weight change.  HENT:  Negative for hearing loss.   Eyes:  Negative for visual disturbance.  Respiratory:  Negative for cough, chest tightness, shortness of breath and wheezing.   Cardiovascular:  Negative for chest pain, palpitations and leg swelling.  Gastrointestinal:  Negative for abdominal distention, abdominal pain, blood in stool, constipation, diarrhea, nausea and vomiting.  Genitourinary:  Negative for difficulty urinating and hematuria.  Musculoskeletal:  Negative for arthralgias, myalgias and neck pain.  Skin:  Negative for rash.  Neurological:  Negative for dizziness, seizures, syncope and headaches.  Hematological:  Negative for adenopathy. Does not bruise/bleed easily.  Psychiatric/Behavioral:  Negative for dysphoric mood. The patient is not nervous/anxious.     Objective:  BP 124/68   Pulse 69   Temp 97.6 F (36.4 C)   Ht _0  (1.651 m)   Wt 159 lb (72.1 kg)   SpO2 98%   BMI 26.46 kg/m   Wt Readings from Last 3 Encounters:  03/08/22 159 lb (72.1 kg)  02/26/22 155 lb (70.3 kg)  09/01/21 163 lb 4 oz (74 kg)      Physical Exam Vitals and nursing note reviewed.  Constitutional:      Appearance: Normal appearance. She is not ill-appearing.  HENT:     Head: Normocephalic and atraumatic.     Right Ear: Tympanic membrane, ear canal  and external ear normal. There is no impacted cerumen.     Left Ear: Tympanic membrane, ear canal and external ear normal. There is no impacted cerumen.     Nose: Nose normal.     Mouth/Throat:     Mouth: Mucous membranes are moist.     Pharynx: Oropharynx is clear. No oropharyngeal exudate or posterior oropharyngeal erythema.  Eyes:     General:        Right eye: No discharge.        Left eye: No discharge.     Extraocular Movements: Extraocular movements intact.     Conjunctiva/sclera: Conjunctivae normal.  Pupils: Pupils are equal, round, and reactive to light.  Neck:     Thyroid: No thyroid mass or thyromegaly.     Vascular: No carotid bruit.  Cardiovascular:     Rate and Rhythm: Normal rate and regular rhythm.     Pulses: Normal pulses.     Heart sounds: Murmur (3/6 systolic throughout) heard.  Pulmonary:     Effort: Pulmonary effort is normal. No respiratory distress.     Breath sounds: Normal breath sounds. No wheezing, rhonchi or rales.  Abdominal:     General: Bowel sounds are normal. There is no distension.     Palpations: Abdomen is soft. There is no mass.     Tenderness: There is no abdominal tenderness. There is no guarding or rebound.     Hernia: No hernia is present.  Musculoskeletal:     Cervical back: Normal range of motion and neck supple. No rigidity.     Right lower leg: No edema.     Left lower leg: No edema.  Lymphadenopathy:     Cervical: No cervical adenopathy.  Skin:    General: Skin is warm and dry.     Findings: No rash.  Neurological:     General: No focal deficit present.     Mental Status: She is alert. Mental status is at baseline.  Psychiatric:        Mood and Affect: Mood normal.        Behavior: Behavior normal.       Results for orders placed or performed in visit on 03/01/22  CBC with Differential/Platelet  Result Value Ref Range   WBC 5.9 4.0 - 10.5 K/uL   RBC 4.97 3.87 - 5.11 Mil/uL   Hemoglobin 12.7 12.0 - 15.0 g/dL   HCT  38.4 36.0 - 46.0 %   MCV 77.2 (L) 78.0 - 100.0 fl   MCHC 33.0 30.0 - 36.0 g/dL   RDW 15.2 11.5 - 15.5 %   Platelets 331.0 150.0 - 400.0 K/uL   Neutrophils Relative % 50.2 43.0 - 77.0 %   Lymphocytes Relative 40.9 12.0 - 46.0 %   Monocytes Relative 6.7 3.0 - 12.0 %   Eosinophils Relative 1.5 0.0 - 5.0 %   Basophils Relative 0.7 0.0 - 3.0 %   Neutro Abs 3.0 1.4 - 7.7 K/uL   Lymphs Abs 2.4 0.7 - 4.0 K/uL   Monocytes Absolute 0.4 0.1 - 1.0 K/uL   Eosinophils Absolute 0.1 0.0 - 0.7 K/uL   Basophils Absolute 0.0 0.0 - 0.1 K/uL  Microalbumin / creatinine urine ratio  Result Value Ref Range   Microalb, Ur <0.7 0.0 - 1.9 mg/dL   Creatinine,U 55.8 mg/dL   Microalb Creat Ratio 1.3 0.0 - 30.0 mg/g  Hemoglobin A1c  Result Value Ref Range   Hgb A1c MFr Bld 6.8 (H) 4.6 - 6.5 %  Comprehensive metabolic panel  Result Value Ref Range   Sodium 137 135 - 145 mEq/L   Potassium 4.2 3.5 - 5.1 mEq/L   Chloride 100 96 - 112 mEq/L   CO2 27 19 - 32 mEq/L   Glucose, Bld 141 (H) 70 - 99 mg/dL   BUN 15 6 - 23 mg/dL   Creatinine, Ser 0.69 0.40 - 1.20 mg/dL   Total Bilirubin 0.5 0.2 - 1.2 mg/dL   Alkaline Phosphatase 67 39 - 117 U/L   AST 18 0 - 37 U/L   ALT 15 0 - 35 U/L   Total Protein 7.1 6.0 - 8.3  g/dL   Albumin 4.5 3.5 - 5.2 g/dL   GFR 85.63 >60.00 mL/min   Calcium 9.5 8.4 - 10.5 mg/dL  Lipid panel  Result Value Ref Range   Cholesterol 187 0 - 200 mg/dL   Triglycerides 141.0 0.0 - 149.0 mg/dL   HDL 48.80 >39.00 mg/dL   VLDL 28.2 0.0 - 40.0 mg/dL   LDL Cholesterol 110 (H) 0 - 99 mg/dL   Total CHOL/HDL Ratio 4    NonHDL 138.47   VITAMIN D 25 Hydroxy (Vit-D Deficiency, Fractures)  Result Value Ref Range   VITD 48.58 30.00 - 100.00 ng/mL    Assessment & Plan:   Problem List Items Addressed This Visit     Healthcare maintenance - Primary (Chronic)    Preventative protocols reviewed and updated unless pt declined. Discussed healthy diet and lifestyle.       Type 2 diabetes mellitus with  other specified complication (HCC)    Chronic, well controlled - congratulated. Continue low dose trulicity along with metformin.       Hyperlipidemia associated with type 2 diabetes mellitus (HCC)    Chronic, now off statin due to myalgias. She continues zetia. Discussed possibly adding PCSK9i injection vs bempedoic acid. She prefers to start daily fiber supplement. Reassess FLP at next labwork.  The 10-year ASCVD risk score (Arnett DK, et al., 2019) is: 24.6%   Values used to calculate the score:     Age: 46 years     Sex: Female     Is Non-Hispanic African American: No     Diabetic: Yes     Tobacco smoker: No     Systolic Blood Pressure: 629 mmHg     Is BP treated: No     HDL Cholesterol: 48.8 mg/dL     Total Cholesterol: 187 mg/dL       NAFLD (nonalcoholic fatty liver disease)     Fibrosis 4 Score = 1.04      Fib-4 interpretation is not validated for people under 62 or over 52 years of age.      Systolic murmur    Prior echo showing mild degenerative MR along with aortic sclerosis without stenosis. Given progressive murmur heard, will update echocardiogram.       Relevant Orders   ECHOCARDIOGRAM COMPLETE   Statin intolerance    Myalgias to even weekly statin       Squamous cell cancer of scalp and skin of neck    Regularly sees dermatology.         No orders of the defined types were placed in this encounter.  Orders Placed This Encounter  Procedures   ECHOCARDIOGRAM COMPLETE    Standing Status:   Future    Standing Expiration Date:   03/09/2023    Order Specific Question:   Where should this test be performed    Answer:   [facility] [place]    Order Specific Question:   Perflutren DEFINITY (image enhancing agent) should be administered unless hypersensitivity or allergy exist    Answer:   Administer Perflutren    Order Specific Question:   Is a special reader required? (athlete or structural heart)    Answer:   No    Order Specific Question:   Does this  study need to be read by the Structural team/Level 3 readers?    Answer:   No    Order Specific Question:   Reason for exam-Echo    Answer:   Murmur R01.1     Patient  instructions: You are doing well today Start daily fiber supplement to help lower LDL cholesterol levels - goal <100. Benefiber or metamucil or equivalent.  We will order heart ultrasound to update this in murmur history.  We will recheck this next visit. Return in 6 months for diabetes follow up visit, prior for fasting labs.   Follow up plan: Return in about 6 months (around 09/06/2022) for follow up visit.  Ria Bush, MD

## 2022-03-08 NOTE — Assessment & Plan Note (Signed)
Prior echo showing mild degenerative MR along with aortic sclerosis without stenosis. Given progressive murmur heard, will update echocardiogram.

## 2022-03-08 NOTE — Assessment & Plan Note (Addendum)
Chronic, now off statin due to myalgias. She continues zetia. Discussed possibly adding PCSK9i injection vs bempedoic acid. She prefers to start daily fiber supplement. Reassess FLP at next labwork.  The 10-year ASCVD risk score (Arnett DK, et al., 2019) is: 24.6%   Values used to calculate the score:     Age: 76 years     Sex: Female     Is Non-Hispanic African American: No     Diabetic: Yes     Tobacco smoker: No     Systolic Blood Pressure: 125 mmHg     Is BP treated: No     HDL Cholesterol: 48.8 mg/dL     Total Cholesterol: 187 mg/dL

## 2022-03-08 NOTE — Assessment & Plan Note (Signed)
Regularly sees dermatology.  

## 2022-03-08 NOTE — Patient Instructions (Addendum)
You are doing well today Start daily fiber supplement to help lower LDL cholesterol levels - goal <100. Benefiber or metamucil or equivalent.  We will order heart ultrasound to update this in murmur history.  We will recheck this next visit. Return in 6 months for diabetes follow up visit, prior for fasting labs.   Health Maintenance After Age 74 After age 69, you are at a higher risk for certain long-term diseases and infections as well as injuries from falls. Falls are a major cause of broken bones and head injuries in people who are older than age 85. Getting regular preventive care can help to keep you healthy and well. Preventive care includes getting regular testing and making lifestyle changes as recommended by your health care provider. Talk with your health care provider about: Which screenings and tests you should have. A screening is a test that checks for a disease when you have no symptoms. A diet and exercise plan that is right for you. What should I know about screenings and tests to prevent falls? Screening and testing are the best ways to find a health problem early. Early diagnosis and treatment give you the best chance of managing medical conditions that are common after age 56. Certain conditions and lifestyle choices may make you more likely to have a fall. Your health care provider may recommend: Regular vision checks. Poor vision and conditions such as cataracts can make you more likely to have a fall. If you wear glasses, make sure to get your prescription updated if your vision changes. Medicine review. Work with your health care provider to regularly review all of the medicines you are taking, including over-the-counter medicines. Ask your health care provider about any side effects that may make you more likely to have a fall. Tell your health care provider if any medicines that you take make you feel dizzy or sleepy. Strength and balance checks. Your health care provider may  recommend certain tests to check your strength and balance while standing, walking, or changing positions. Foot health exam. Foot pain and numbness, as well as not wearing proper footwear, can make you more likely to have a fall. Screenings, including: Osteoporosis screening. Osteoporosis is a condition that causes the bones to get weaker and break more easily. Blood pressure screening. Blood pressure changes and medicines to control blood pressure can make you feel dizzy. Depression screening. You may be more likely to have a fall if you have a fear of falling, feel depressed, or feel unable to do activities that you used to do. Alcohol use screening. Using too much alcohol can affect your balance and may make you more likely to have a fall. Follow these instructions at home: Lifestyle Do not drink alcohol if: Your health care provider tells you not to drink. If you drink alcohol: Limit how much you have to: 0-1 drink a day for women. 0-2 drinks a day for men. Know how much alcohol is in your drink. In the [place] one drink equals one 12 oz bottle of beer (355 mL), one 5 oz glass of wine (148 mL), or one 1 oz glass of hard liquor (44 mL). Do not use any products that contain nicotine or tobacco. These products include cigarettes, chewing tobacco, and vaping devices, such as e-cigarettes. If you need help quitting, ask your health care provider. Activity  Follow a regular exercise program to stay fit. This will help you maintain your balance. Ask your health care provider what types of exercise  are appropriate for you. If you need a cane or walker, use it as recommended by your health care provider. Wear supportive shoes that have nonskid soles. Safety  Remove any tripping hazards, such as rugs, cords, and clutter. Install safety equipment such as grab bars in bathrooms and safety rails on stairs. Keep rooms and walkways well-lit. General instructions Talk with your health care provider  about your risks for falling. Tell your health care provider if: You fall. Be sure to tell your health care provider about all falls, even ones that seem minor. You feel dizzy, tiredness (fatigue), or off-balance. Take over-the-counter and prescription medicines only as told by your health care provider. These include supplements. Eat a healthy diet and maintain a healthy weight. A healthy diet includes low-fat dairy products, low-fat (lean) meats, and fiber from whole grains, beans, and lots of fruits and vegetables. Stay current with your vaccines. Schedule regular health, dental, and eye exams. Summary Having a healthy lifestyle and getting preventive care can help to protect your health and wellness after age 92. Screening and testing are the best way to find a health problem early and help you avoid having a fall. Early diagnosis and treatment give you the best chance for managing medical conditions that are more common for people who are older than age 21. Falls are a major cause of broken bones and head injuries in people who are older than age 68. Take precautions to prevent a fall at home. Work with your health care provider to learn what changes you can make to improve your health and wellness and to prevent falls. This information is not intended to replace advice given to you by your health care provider. Make sure you discuss any questions you have with your health care provider. Document Revised: 08/25/2020 Document Reviewed: 08/25/2020 Elsevier Patient Education  [place].

## 2022-03-08 NOTE — Assessment & Plan Note (Signed)
Chronic, well controlled - congratulated. Continue low dose trulicity along with metformin.

## 2022-03-08 NOTE — Assessment & Plan Note (Signed)
Fibrosis 4 Score = 1.04      Fib-4 interpretation is not validated for people under 54 or over 74 years of age.

## 2022-03-08 NOTE — Assessment & Plan Note (Signed)
Myalgias to even weekly statin

## 2022-03-23 ENCOUNTER — Telehealth: Payer: Self-pay | Admitting: Family Medicine

## 2022-03-23 DIAGNOSIS — D1801 Hemangioma of skin and subcutaneous tissue: Secondary | ICD-10-CM | POA: Diagnosis not present

## 2022-03-23 DIAGNOSIS — L570 Actinic keratosis: Secondary | ICD-10-CM | POA: Diagnosis not present

## 2022-03-23 DIAGNOSIS — C44722 Squamous cell carcinoma of skin of right lower limb, including hip: Secondary | ICD-10-CM | POA: Diagnosis not present

## 2022-03-23 DIAGNOSIS — Z85828 Personal history of other malignant neoplasm of skin: Secondary | ICD-10-CM | POA: Diagnosis not present

## 2022-03-23 DIAGNOSIS — L853 Xerosis cutis: Secondary | ICD-10-CM | POA: Diagnosis not present

## 2022-03-23 DIAGNOSIS — L565 Disseminated superficial actinic porokeratosis (DSAP): Secondary | ICD-10-CM | POA: Diagnosis not present

## 2022-03-23 DIAGNOSIS — D485 Neoplasm of uncertain behavior of skin: Secondary | ICD-10-CM | POA: Diagnosis not present

## 2022-03-23 DIAGNOSIS — L821 Other seborrheic keratosis: Secondary | ICD-10-CM | POA: Diagnosis not present

## 2022-03-23 DIAGNOSIS — D044 Carcinoma in situ of skin of scalp and neck: Secondary | ICD-10-CM | POA: Diagnosis not present

## 2022-03-23 NOTE — Telephone Encounter (Signed)
No pa is required with [facility] ELEA

## 2022-03-23 NOTE — Telephone Encounter (Signed)
[facility] called to get a authorization for a echo procedure the pt is having done tomorrow, 03/24/22. Office stated authorization could be added to notes in pt's chart. Call back # [phone]

## 2022-03-24 ENCOUNTER — Ambulatory Visit: Payer: PPO | Attending: Family Medicine

## 2022-03-24 DIAGNOSIS — R011 Cardiac murmur, unspecified: Secondary | ICD-10-CM

## 2022-03-24 LAB — ECHOCARDIOGRAM COMPLETE
AR max vel: 2.49 cm2
AV Area VTI: 2.82 cm2
AV Area mean vel: 2.63 cm2
AV Mean grad: 7 mmHg
AV Peak grad: 13.2 mmHg
Ao pk vel: 1.82 m/s
Area-P 1/2: 2.11 cm2
Calc EF: 75.3 %
S' Lateral: 2.6 cm
Single Plane A2C EF: 72.4 %
Single Plane A4C EF: 77.1 %

## 2022-04-05 ENCOUNTER — Ambulatory Visit
Admission: RE | Admit: 2022-04-05 | Discharge: 2022-04-05 | Disposition: A | Discharge status: Home / Self Care | Payer: PPO | Source: Ambulatory Visit | Attending: Family Medicine | Admitting: Family Medicine

## 2022-04-05 DIAGNOSIS — Z1231 Encounter for screening mammogram for malignant neoplasm of breast: Secondary | ICD-10-CM | POA: Diagnosis not present

## 2022-04-19 ENCOUNTER — Other Ambulatory Visit: Payer: Self-pay | Admitting: Family Medicine

## 2022-04-19 DIAGNOSIS — E1169 Type 2 diabetes mellitus with other specified complication: Secondary | ICD-10-CM

## 2022-04-22 ENCOUNTER — Encounter: Payer: Self-pay | Admitting: Family Medicine

## 2022-04-26 DIAGNOSIS — H6983 Other specified disorders of Eustachian tube, bilateral: Secondary | ICD-10-CM | POA: Diagnosis not present

## 2022-04-26 DIAGNOSIS — H903 Sensorineural hearing loss, bilateral: Secondary | ICD-10-CM | POA: Diagnosis not present

## 2022-05-21 ENCOUNTER — Other Ambulatory Visit: Payer: Self-pay | Admitting: Family Medicine

## 2022-05-21 DIAGNOSIS — E1169 Type 2 diabetes mellitus with other specified complication: Secondary | ICD-10-CM

## 2022-05-29 ENCOUNTER — Encounter: Payer: Self-pay | Admitting: Family Medicine

## 2022-05-31 NOTE — Telephone Encounter (Signed)
Plz submit PA for Trulicity [id number] mg.

## 2022-06-10 ENCOUNTER — Other Ambulatory Visit (HOSPITAL_COMMUNITY): Payer: Self-pay

## 2022-06-10 NOTE — Telephone Encounter (Signed)
Patient Advocate Encounter   Received notification from [facility] that prior authorization for Trulicity is required.   PA submitted on 06/10/2022 Key [id number] Status is pending

## 2022-06-10 NOTE — Telephone Encounter (Signed)
Noted  

## 2022-06-16 ENCOUNTER — Other Ambulatory Visit: Payer: Self-pay | Admitting: Family Medicine

## 2022-06-16 DIAGNOSIS — E1169 Type 2 diabetes mellitus with other specified complication: Secondary | ICD-10-CM

## 2022-06-16 NOTE — Telephone Encounter (Signed)
Refill left on vm at pharmacy.  

## 2022-06-25 ENCOUNTER — Other Ambulatory Visit (HOSPITAL_COMMUNITY): Payer: Self-pay

## 2022-06-30 NOTE — Telephone Encounter (Signed)
Opened in error

## 2022-08-09 DIAGNOSIS — Z85828 Personal history of other malignant neoplasm of skin: Secondary | ICD-10-CM | POA: Diagnosis not present

## 2022-08-09 DIAGNOSIS — L570 Actinic keratosis: Secondary | ICD-10-CM | POA: Diagnosis not present

## 2022-08-29 ENCOUNTER — Other Ambulatory Visit: Payer: Self-pay | Admitting: Family Medicine

## 2022-08-29 DIAGNOSIS — E1169 Type 2 diabetes mellitus with other specified complication: Secondary | ICD-10-CM

## 2022-08-30 ENCOUNTER — Other Ambulatory Visit (INDEPENDENT_AMBULATORY_CARE_PROVIDER_SITE_OTHER): Payer: PPO

## 2022-08-30 DIAGNOSIS — E1169 Type 2 diabetes mellitus with other specified complication: Secondary | ICD-10-CM

## 2022-08-30 DIAGNOSIS — E785 Hyperlipidemia, unspecified: Secondary | ICD-10-CM | POA: Diagnosis not present

## 2022-08-30 LAB — LIPID PANEL
Cholesterol: 164 mg/dL (ref 0–200)
HDL: 42.4 mg/dL (ref >39.00)
LDL Cholesterol: 98 mg/dL (ref 0–99)
NonHDL: 121.68
Total CHOL/HDL Ratio: 4
Triglycerides: 116 mg/dL (ref 0.0–149.0)
VLDL: 23.2 mg/dL (ref 0.0–40.0)

## 2022-08-30 LAB — HEMOGLOBIN A1C: Hgb A1c MFr Bld: 7.8 % — ABNORMAL HIGH (ref 4.6–6.5)

## 2022-09-03 ENCOUNTER — Encounter: Payer: Self-pay | Admitting: Pharmacist

## 2022-09-03 NOTE — Progress Notes (Signed)
[facility]) [facility] Quality Pharmacy Team Statin Quality Measure Assessment  09/03/2022  Betty Sanders 04-30-1945 [id number]  Per review of chart and payor information, patient has a diagnosis of diabetes but is not currently filling a statin prescription.  This places patient into the Statin Use In Patients with Diabetes (SUPD) measure for CMS.    Patient has documented trials of atorvastatin and pravastatin with reported muscle related intolerances, but no corresponding CPT codes that would exclude patient from SUPD measure.  The 10-year ASCVD risk score (Arnett DK, et al., 2019) is: 24.3%   Values used to calculate the score:     Age: 77 years     Sex: Female     Is Non-Hispanic African American: No     Diabetic: Yes     Tobacco smoker: No     Systolic Blood Pressure: 124 mmHg     Is BP treated: No     HDL Cholesterol: 42.4 mg/dL     Total Cholesterol: 164 mg/dL 07/26/8117     Component Value Date/Time   CHOL 164 08/30/2022 0727   TRIG 116.0 08/30/2022 0727   HDL 42.40 08/30/2022 0727   CHOLHDL 4 08/30/2022 0727   VLDL 23.2 08/30/2022 0727   LDLCALC 98 08/30/2022 0727   LDLDIRECT 69.0 04/28/2016 0808    Please consider ONE of the following recommendations:  Initiate high intensity statin Atorvastatin 40 mg once daily, #90, 3 refills   Rosuvastatin 20 mg once daily, #90, 3 refills    Initiate moderate intensity          statin with reduced frequency if prior          statin intolerance 1x weekly, #13, 3 refills   2x weekly, #26, 3 refills   3x weekly, #39, 3 refills    Code for past statin intolerance or  other exclusions (required annually)  Provider Requirements: Associate code during an office visit or telehealth encounter  Drug Induced Myopathy G72.0   Myopathy, unspecified G72.9   Myositis, unspecified M60.9   Rhabdomyolysis M62.82   Cirrhosis of liver K74.69   Prediabetes R73.03   PCOS E28.2   Thank you for allowing [facility] pharmacy to be  a part of this patient's care.  Reynold Bowen, PharmD [facility]  [facility] Clinical Pharmacist Direct Dial: [phone]

## 2022-09-06 ENCOUNTER — Ambulatory Visit (INDEPENDENT_AMBULATORY_CARE_PROVIDER_SITE_OTHER): Payer: PPO | Admitting: Family Medicine

## 2022-09-06 ENCOUNTER — Encounter: Payer: Self-pay | Admitting: Family Medicine

## 2022-09-06 VITALS — BP 142/66 | HR 57 | Temp 97.1°F | Ht 65.0 in | Wt 165.1 lb

## 2022-09-06 DIAGNOSIS — C4442 Squamous cell carcinoma of skin of scalp and neck: Secondary | ICD-10-CM

## 2022-09-06 DIAGNOSIS — R011 Cardiac murmur, unspecified: Secondary | ICD-10-CM | POA: Diagnosis not present

## 2022-09-06 DIAGNOSIS — E1169 Type 2 diabetes mellitus with other specified complication: Secondary | ICD-10-CM | POA: Diagnosis not present

## 2022-09-06 NOTE — Patient Instructions (Addendum)
Return in 6 months for physical.  Return in 3 months for A1c.  Continue current medicines.  Freestyle libre 3 sample provided today

## 2022-09-06 NOTE — Progress Notes (Addendum)
Ph: [phone] Fax: [phone]   Patient ID: Betty Sanders, female    DOB: [birth year], 77 y.o.   MRN: [id number]  This visit was conducted in person.  BP (!) 142/66   Pulse (!) 57   Temp (!) 97.1 F (36.2 C) (Temporal)   Ht 5\' 5"  (1.651 m)   Wt 165 lb 2 oz (74.9 kg)   SpO2 98%   BMI 27.48 kg/m    CC: 6 mo DM f/u visit  Subjective:   HPI: Betty Sanders is a 77 y.o. female presenting on 09/06/2022 for Medical Management of Chronic Issues (Here for 6 mo DM f/u. )   Hearing trouble - sees ENT, new hearing aides.   Continues treating scalp for squamous cell cancer. Now on 4th week of treatment. Sees dermatologist Dr Yetta Barre in [place] next month. S/p Moh's, s/p 5-FU, now on immunotherapy.   DM - does not regularly check sugars. Attributes elevated sugars to current skin cancer treatment. Compliant with antihyperglycemic regimen which includes: Trulicity 0.75mg  weekly and metformin XR 1000mg  bid. Denies low sugars or hypoglycemic symptoms. Denies paresthesias, blurry vision. Last diabetic eye exam 01/2022. Glucometer brand: one touch verio. Last foot exam: 08/2021 - DUE. DSME: 2015 ([facility]). Lab Results  Component Value Date   HGBA1C 7.8 (H) 08/30/2022   Diabetic Foot Exam - Simple   Simple Foot Form Diabetic Foot exam was performed with the following findings: Yes 09/06/2022  9:30 AM  Visual Inspection No deformities, no ulcerations, no other skin breakdown bilaterally: Yes Sensation Testing Intact to touch and monofilament testing bilaterally: Yes Pulse Check Posterior Tibialis and Dorsalis pulse intact bilaterally: Yes Comments No claudication symptoms    Lab Results  Component Value Date   MICROALBUR <0.7 03/01/2022         Relevant past medical, surgical, family and social history reviewed and updated as indicated. Interim medical history since our last visit reviewed. Allergies and medications reviewed and updated. Outpatient Medications Prior to Visit   Medication Sig Dispense Refill   Blood Glucose Monitoring Suppl (ONETOUCH VERIO IQ SYSTEM) w/Device KIT Check blood sugar twice a day and as directed. Dx E11.9 1 kit 0   Cholecalciferol (VITAMIN D3) 25 MCG (1000 UT) CAPS Take 1 capsule (1,000 Units total) by mouth daily. 30 capsule    Coenzyme Q10 (COQ10) 50 MG CAPS Take 1 capsule by mouth in the morning and at bedtime.     Dulaglutide (TRULICITY) 0.75 MG/0.5ML SOPN INJECT 0.75 MG INTO THE SKIN ONCE A WEEK. 6 mL 3   ezetimibe (ZETIA) 10 MG tablet TAKE 1 TABLET BY MOUTH EVERY DAY 90 tablet 3   glucose blood (ONETOUCH VERIO) test strip Check blood sugar twice a day. 200 strip 3   imiquimod (ALDARA) 5 % cream      metFORMIN (GLUCOPHAGE-XR) 500 MG 24 hr tablet TAKE 2 TABLETS (1,000 MG TOTAL) BY MOUTH IN THE MORNING AND AT BEDTIME. 360 tablet 3   ONETOUCH DELICA LANCETS 33G MISC Check blood sugar twice a day and as directed. Dx E11.9 100 each 6   Probiotic Product (PROBIOTIC PO) Take by mouth daily.     triamcinolone (NASACORT) 55 MCG/ACT AERO nasal inhaler 2 sprays 2 (two) times daily.     vitamin C (ASCORBIC ACID) 500 MG tablet Take 500 mg by mouth as needed.     No facility-administered medications prior to visit.     Per HPI unless specifically indicated in ROS section below Review of Systems  Objective:  BP (!) 142/66   Pulse (!) 57   Temp (!) 97.1 F (36.2 C) (Temporal)   Ht 5\' 5"  (1.651 m)   Wt 165 lb 2 oz (74.9 kg)   SpO2 98%   BMI 27.48 kg/m   Wt Readings from Last 3 Encounters:  09/06/22 165 lb 2 oz (74.9 kg)  03/08/22 159 lb (72.1 kg)  02/26/22 155 lb (70.3 kg)      Physical Exam Vitals and nursing note reviewed.  Constitutional:      Appearance: Normal appearance. She is not ill-appearing.  Eyes:     Extraocular Movements: Extraocular movements intact.     Conjunctiva/sclera: Conjunctivae normal.     Pupils: Pupils are equal, round, and reactive to light.  Cardiovascular:     Rate and Rhythm: Normal rate and  regular rhythm.     Pulses: Normal pulses.     Heart sounds: Murmur (3/6 systolic USB) heard.  Pulmonary:     Effort: Pulmonary effort is normal. No respiratory distress.     Breath sounds: Normal breath sounds. No wheezing, rhonchi or rales.  Musculoskeletal:     Right lower leg: No edema.     Left lower leg: No edema.  Skin:    General: Skin is warm and dry.     Findings: No rash.  Neurological:     Mental Status: She is alert.  Psychiatric:        Mood and Affect: Mood normal.        Behavior: Behavior normal.       Results for orders placed or performed in visit on 08/30/22  Lipid panel  Result Value Ref Range   Cholesterol 164 0 - 200 mg/dL   Triglycerides 161.0 0.0 - 149.0 mg/dL   HDL 96.04 >54.09 mg/dL   VLDL 81.1 0.0 - 91.4 mg/dL   LDL Cholesterol 98 0 - 99 mg/dL   Total CHOL/HDL Ratio 4    NonHDL 121.68   Hemoglobin A1c  Result Value Ref Range   Hgb A1c MFr Bld 7.8 (H) 4.6 - 6.5 %    Assessment & Plan:   Problem List Items Addressed This Visit     Type 2 diabetes mellitus with other specified complication (HCC) - Primary    Chronic, deteriorated control, attributes to imiquimod scalp skin cancer treatment. Declines med changes at this time until she completes skin treatment.       Systolic murmur    Known aortic sclerosis without stenosis.      Squamous cell cancer of scalp and skin of neck    Continues imiquimod cream for scalp treatment (Dr Yetta Barre).         No orders of the defined types were placed in this encounter.   No orders of the defined types were placed in this encounter.   Patient Instructions  Return in 6 months for physical.  Return in 3 months for A1c.  Continue current medicines.  Freestyle libre 3 sample provided today  Follow up plan: Return in about 6 months (around 03/09/2023) for annual exam, prior fasting for blood work, medicare wellness visit.  Eustaquio Boyden, MD

## 2022-09-06 NOTE — Assessment & Plan Note (Signed)
Known aortic sclerosis without stenosis.

## 2022-09-06 NOTE — Assessment & Plan Note (Signed)
Continues imiquimod cream for scalp treatment (Dr Yetta Barre).

## 2022-09-06 NOTE — Assessment & Plan Note (Signed)
Chronic, deteriorated control, attributes to imiquimod scalp skin cancer treatment. Declines med changes at this time until she completes skin treatment.

## 2022-09-15 IMAGING — MG MM DIGITAL SCREENING BILAT W/ TOMO AND CAD
8 series · 8 of 24 positions shown · non-contrast
Comparison: Previous exam(s).

CLINICAL DATA: Screening.

EXAM:
DIGITAL SCREENING BILATERAL MAMMOGRAM WITH TOMOSYNTHESIS AND CAD
TECHNIQUE: Bilateral screening digital craniocaudal and mediolateral oblique
mammograms were obtained. Bilateral screening digital breast
tomosynthesis was performed. The images were evaluated with
computer-aided detection.

[R MLO synth-2D]
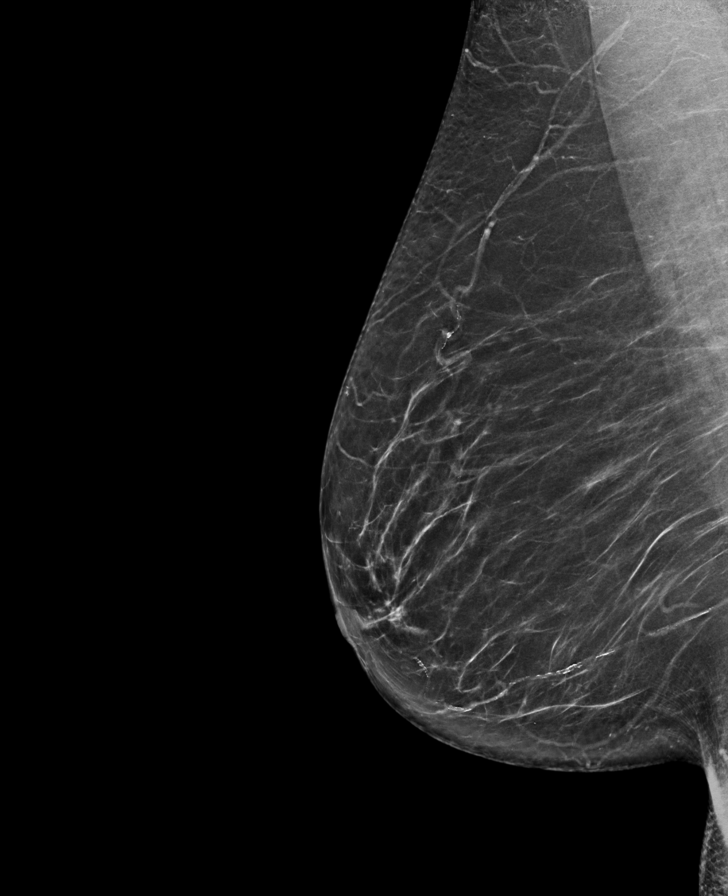

[L MLO synth-2D]
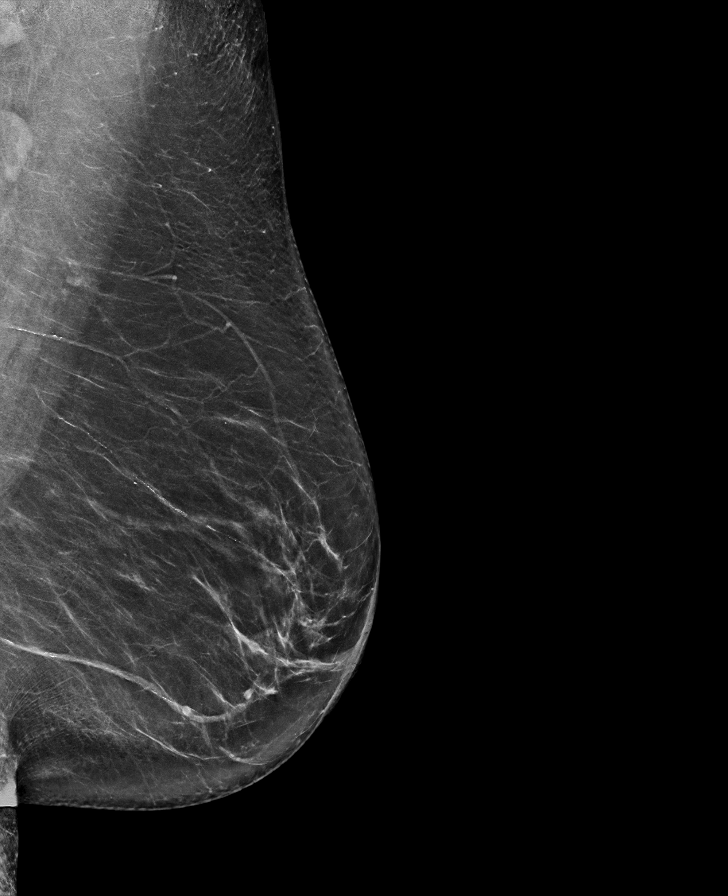

[L CC synth-2D]
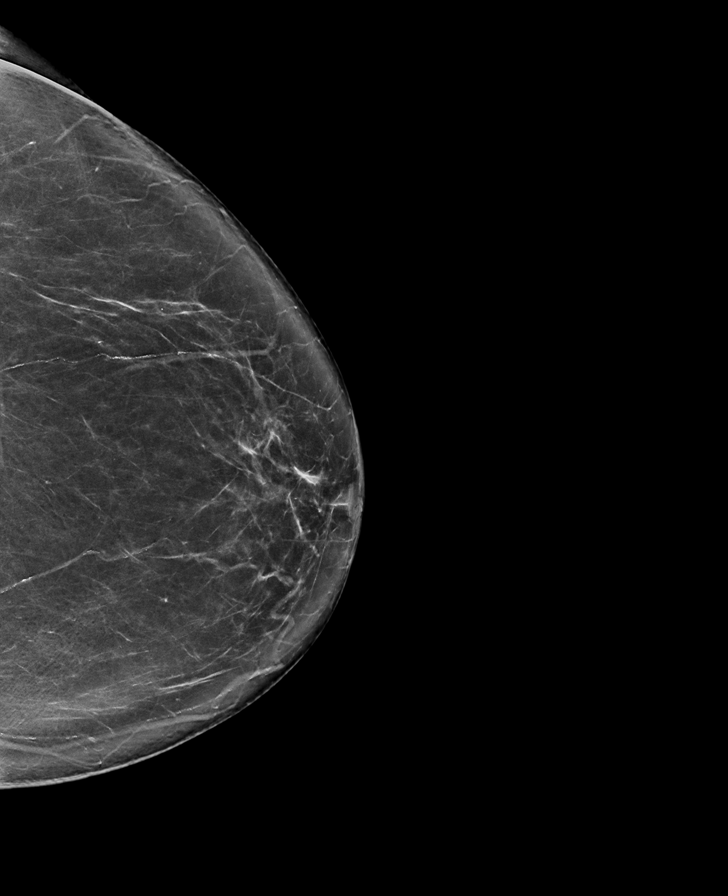

[R CC synth-2D]
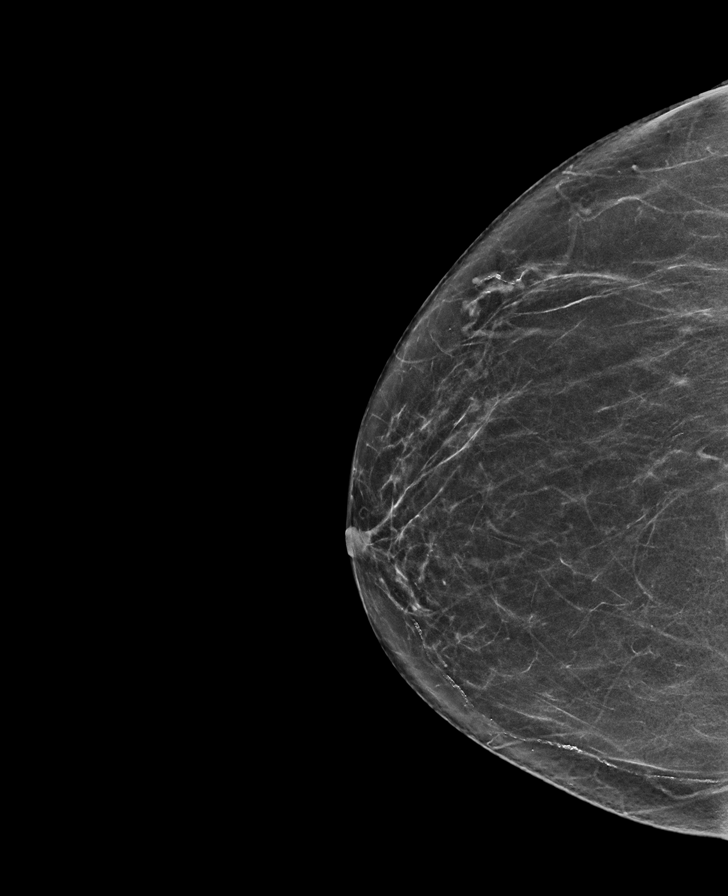

[L CC tomo · tomo slice 41/82.0]
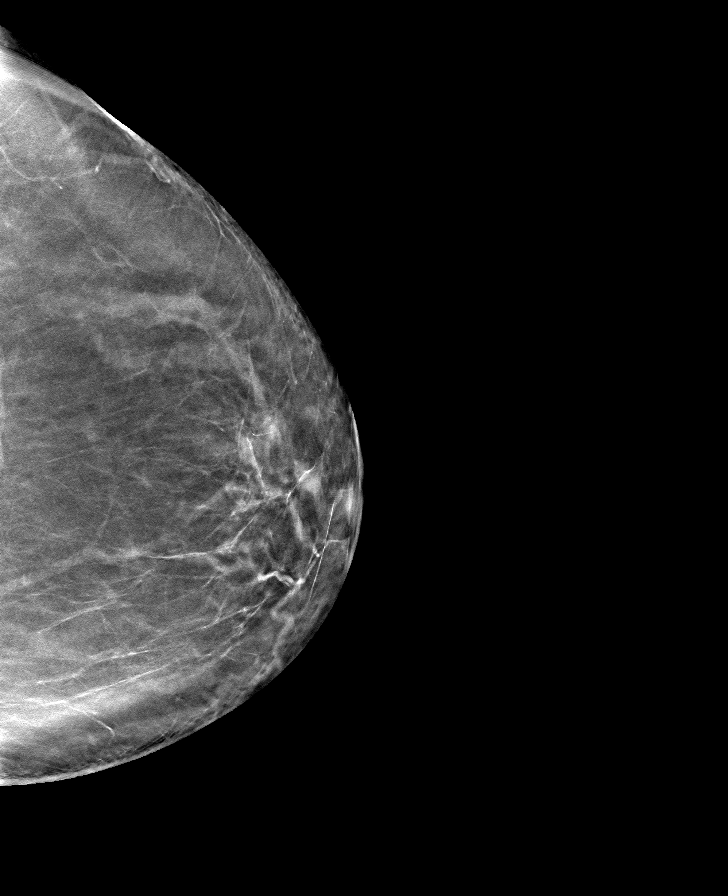

[L MLO tomo · tomo slice 41/80.0]
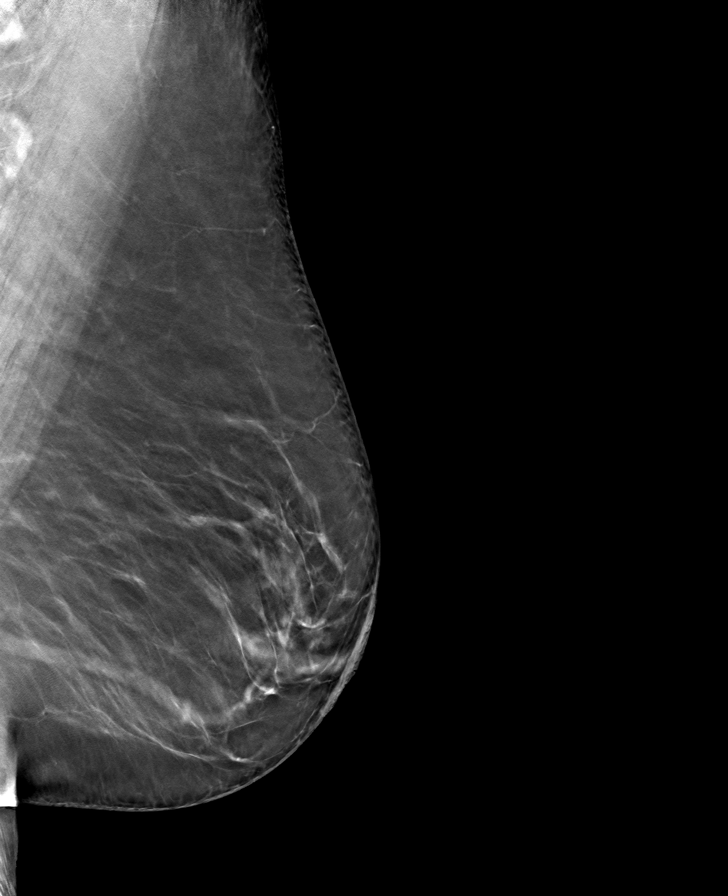

[R MLO tomo · tomo slice 37/73.0]
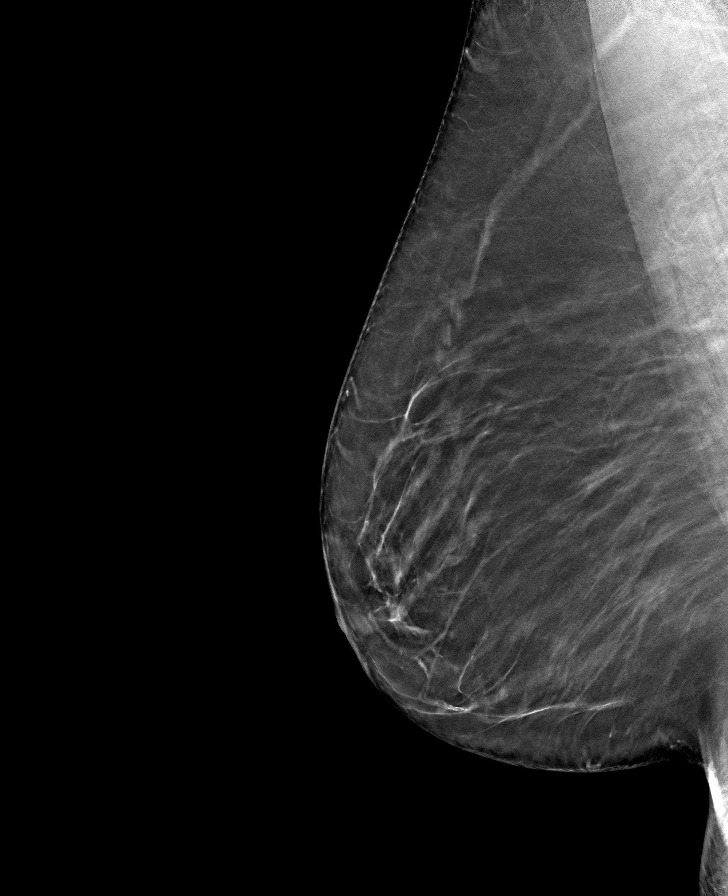

[R CC tomo · tomo slice 31/62.0]
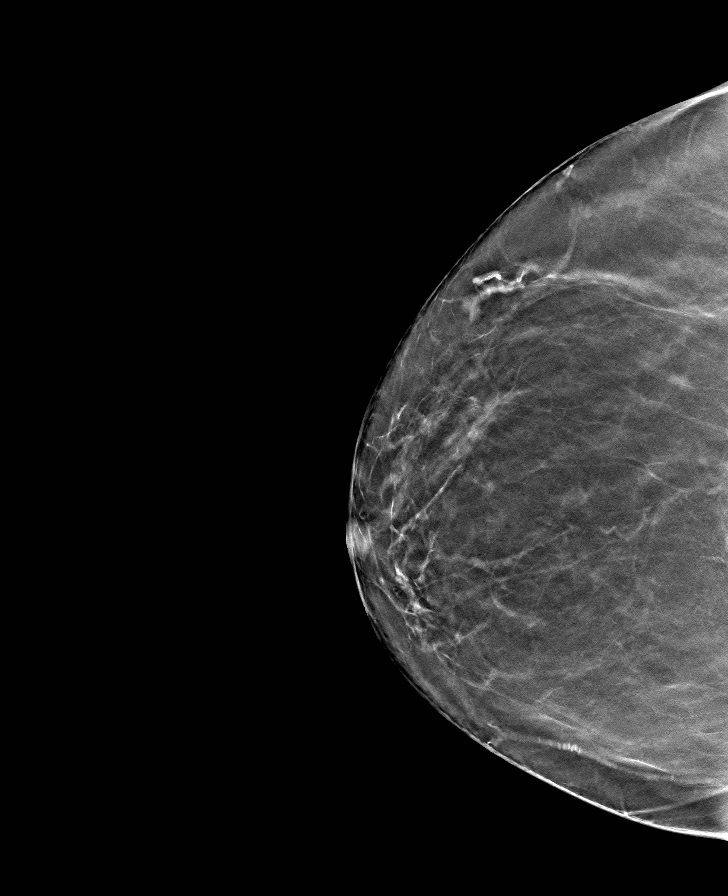

[8 of 24 positions shown; findings below may reference images not displayed]

ACR Breast Density Category b: There are scattered areas of
fibroglandular density.
FINDINGS: There are no findings suspicious for malignancy.
IMPRESSION: No mammographic evidence of malignancy. A result letter of this
screening mammogram will be mailed directly to the patient.

RECOMMENDATION:
Screening mammogram in one year. (Code:51-O-LD2)

BI-RADS CATEGORY  1: Negative.

## 2022-10-18 DIAGNOSIS — Z85828 Personal history of other malignant neoplasm of skin: Secondary | ICD-10-CM | POA: Diagnosis not present

## 2022-10-18 DIAGNOSIS — L570 Actinic keratosis: Secondary | ICD-10-CM | POA: Diagnosis not present

## 2022-11-23 ENCOUNTER — Encounter: Payer: Self-pay | Admitting: Pharmacist

## 2022-11-23 NOTE — Progress Notes (Signed)
Pharmacy Quality Measure Review  This patient is appearing on report for being at risk of failing the adherence measure for Statin Use in Persons with Diabetes (SUPD) medications this calendar year.   Prior trials of: pravastatin, atorvastatin, both resulting in myalgias.   Note to use statin myalgia exclusion code (G72.0) placed in next PCP appointment as a reminder to use to exclude patient from measure.   Catie Eppie Gibson, PharmD, BCACP, CPP Clinical Pharmacist [facility] [phone]

## 2022-12-04 ENCOUNTER — Encounter: Payer: Self-pay | Admitting: Family Medicine

## 2022-12-07 ENCOUNTER — Other Ambulatory Visit (INDEPENDENT_AMBULATORY_CARE_PROVIDER_SITE_OTHER): Payer: PPO

## 2022-12-07 DIAGNOSIS — E1169 Type 2 diabetes mellitus with other specified complication: Secondary | ICD-10-CM

## 2022-12-07 LAB — POCT GLYCOSYLATED HEMOGLOBIN (HGB A1C): Hemoglobin A1C: 6 % — AB (ref 4.0–5.6)

## 2023-01-19 DIAGNOSIS — L309 Dermatitis, unspecified: Secondary | ICD-10-CM | POA: Diagnosis not present

## 2023-01-19 DIAGNOSIS — Z85828 Personal history of other malignant neoplasm of skin: Secondary | ICD-10-CM | POA: Diagnosis not present

## 2023-01-24 DIAGNOSIS — E119 Type 2 diabetes mellitus without complications: Secondary | ICD-10-CM | POA: Diagnosis not present

## 2023-01-24 DIAGNOSIS — H43812 Vitreous degeneration, left eye: Secondary | ICD-10-CM | POA: Diagnosis not present

## 2023-01-24 DIAGNOSIS — Z961 Presence of intraocular lens: Secondary | ICD-10-CM | POA: Diagnosis not present

## 2023-01-24 DIAGNOSIS — G43109 Migraine with aura, not intractable, without status migrainosus: Secondary | ICD-10-CM | POA: Diagnosis not present

## 2023-01-24 LAB — HM DIABETES EYE EXAM

## 2023-01-26 ENCOUNTER — Encounter: Payer: Self-pay | Admitting: Family Medicine

## 2023-02-09 DIAGNOSIS — L570 Actinic keratosis: Secondary | ICD-10-CM | POA: Diagnosis not present

## 2023-02-09 DIAGNOSIS — Z85828 Personal history of other malignant neoplasm of skin: Secondary | ICD-10-CM | POA: Diagnosis not present

## 2023-02-28 ENCOUNTER — Other Ambulatory Visit: Payer: Self-pay | Admitting: Family Medicine

## 2023-02-28 DIAGNOSIS — E1169 Type 2 diabetes mellitus with other specified complication: Secondary | ICD-10-CM

## 2023-03-03 ENCOUNTER — Ambulatory Visit (INDEPENDENT_AMBULATORY_CARE_PROVIDER_SITE_OTHER): Payer: PPO

## 2023-03-03 ENCOUNTER — Other Ambulatory Visit: Payer: Self-pay | Admitting: Family Medicine

## 2023-03-03 VITALS — Ht 65.0 in | Wt 155.0 lb

## 2023-03-03 DIAGNOSIS — Z1231 Encounter for screening mammogram for malignant neoplasm of breast: Secondary | ICD-10-CM

## 2023-03-03 DIAGNOSIS — Z0000 Encounter for general adult medical examination without abnormal findings: Secondary | ICD-10-CM | POA: Diagnosis not present

## 2023-03-03 NOTE — Progress Notes (Signed)
Subjective:   Betty Sanders is a 77 y.o. female who presents for Medicare Annual (Subsequent) preventive examination.  Visit Complete: Virtual I connected with  [name] on 03/03/23 by a audio enabled telemedicine application and verified that I am speaking with the correct person using two identifiers.  Patient Location: Home  Provider Location: Office/Clinic  I discussed the limitations of evaluation and management by telemedicine. The patient expressed understanding and agreed to proceed.  Vital Signs: Because this visit was a virtual/telehealth visit, some criteria may be missing or patient reported. Any vitals not documented were not able to be obtained and vitals that have been documented are patient reported.  Patient Medicare AWV questionnaire was completed by the patient on 02/28/2023; I have confirmed that all information answered by patient is correct and no changes since this date.  Cardiac Risk Factors include: advanced age (>68men, >48 women);dyslipidemia;diabetes mellitus;family history of premature cardiovascular disease     Objective:    Today's Vitals   03/03/23 1420  Weight: 155 lb (70.3 kg)  Height: 5\' 5"  (1.651 m)  PainSc: 0-No pain   Body mass index is 25.79 kg/m.     03/03/2023    2:22 PM 02/26/2022   12:45 PM 05/21/2020    6:37 AM 05/07/2020    6:49 AM 01/16/2020    9:54 AM 01/10/2018    9:52 AM 06/03/2015   12:40 PM  Advanced Directives  Does Patient Have a Medical Advance Directive? Yes Yes Yes Yes Yes No No  Type of Estate agent of [place];Living will Healthcare Power of [place];Living will Healthcare Power of [place];Living will Healthcare Power of [place];Living will Healthcare Power of [place];Living will    Does patient want to make changes to medical advance directive? No - Patient declined No - Patient declined No - Guardian declined No - Patient declined     Copy of Healthcare Power of Attorney in  Chart? Yes - validated most recent copy scanned in chart (See row information) Yes - validated most recent copy scanned in chart (See row information) No - copy requested No - copy requested No - copy requested    Would patient like information on creating a medical advance directive?      Yes (MAU/Ambulatory/Procedural Areas - Information given) No - patient declined information    Current Medications (verified) Outpatient Encounter Medications as of 03/03/2023  Medication Sig   Blood Glucose Monitoring Suppl (ONETOUCH VERIO IQ SYSTEM) w/Device KIT Check blood sugar twice a day and as directed. Dx E11.9   Cholecalciferol (VITAMIN D3) 25 MCG (1000 UT) CAPS Take 1 capsule (1,000 Units total) by mouth daily.   Coenzyme Q10 (COQ10) 50 MG CAPS Take 1 capsule by mouth in the morning and at bedtime.   Dulaglutide (TRULICITY) 0.75 MG/0.5ML SOPN INJECT 0.75 MG INTO THE SKIN ONCE A WEEK.   ezetimibe (ZETIA) 10 MG tablet TAKE 1 TABLET BY MOUTH EVERY DAY   glucose blood (ONETOUCH VERIO) test strip Check blood sugar twice a day.   imiquimod (ALDARA) 5 % cream    metFORMIN (GLUCOPHAGE-XR) 500 MG 24 hr tablet TAKE 2 TABLETS (1,000 MG TOTAL) BY MOUTH IN THE MORNING AND AT BEDTIME.   ONETOUCH DELICA LANCETS 33G MISC Check blood sugar twice a day and as directed. Dx E11.9   Probiotic Product (PROBIOTIC PO) Take by mouth daily.   triamcinolone (NASACORT) 55 MCG/ACT AERO nasal inhaler 2 sprays 2 (two) times daily.   vitamin C (ASCORBIC ACID) 500 MG  tablet Take 500 mg by mouth as needed.   No facility-administered encounter medications on file as of 03/03/2023.    Allergies (verified) Lipitor [atorvastatin], Pravastatin, and Sulfa antibiotics   History: Past Medical History:  Diagnosis Date   Bilateral hearing loss 2016   hearing aides   Diabetes mellitus without complication (HCC)    Dyslipidemia    Heart murmur    Hyperlipidemia    Osteopenia 05/2014   DEXA at [facility]   Squamous cell cancer of  scalp and skin of neck 2013   s/p excision (Dr. Mayford Knife)   Squamous cell cancer of scalp and skin of neck 01/2021   s/p Mohs (Dr Adriana Simas at [facility]), R superior anterior scalp 04/2021 with base involved   Type 2 diabetes mellitus, uncontrolled 02/2012   DSME completed 07/2014   Wears hearing aid in both ears    Past Surgical History:  Procedure Laterality Date   BELPHAROPTOSIS REPAIR Bilateral 11/2016   CATARACT EXTRACTION W/PHACO Left 05/07/2020   Procedure: CATARACT EXTRACTION PHACO AND INTRAOCULAR LENS PLACEMENT (IOC) LEFT DIABETIC 8.70 01:28.6 9.8%;  Surgeon: Lockie Mola, MD;  Location: [facility];  Service: Ophthalmology;  Laterality: Left;  Diabetic - oral meds   CATARACT EXTRACTION W/PHACO Right 05/21/2020   Procedure: CATARACT EXTRACTION PHACO AND INTRAOCULAR LENS PLACEMENT (IOC) RIGHT DIABETIC 4.82 00:45.2 10.6%;  Surgeon: Lockie Mola, MD;  Location: [facility];  Service: Ophthalmology;  Laterality: Right;   COLONOSCOPY  02/2010   tubular adenoma polyp x 1; repeat 5 years--Dr. Leone Payor   COLONOSCOPY  05/2015   diverticulosis, rpt 10 yrs Leone Payor)   DENTAL SURGERY  2017/10/29   dental implants   DEXA  05/2014   spine -2.1, hip -1.4   GANGLION CYST EXCISION  2006   Left wrist   MOHS SURGERY  02/09/2021   squamous cell of frontal scalp (Dr Adriana [facility]as at [facility])   RHINOPLASTY  1970's   WRIST SURGERY     Family History  Problem Relation Age of Onset   Lung cancer Father    Colon cancer Cousin    Breast cancer Paternal Aunt    Cervical cancer Maternal Aunt 68       female cancer   Diabetes Maternal Grandfather    Stroke Paternal Grandfather    CAD Neg Hx    Social History   Socioeconomic History   Marital status: Widowed    Spouse name: Not on file   Number of children: Not on file   Years of education: Not on file   Highest education level: Master's degree (e.g., MA, MS, MEng, MEd, MSW, MBA)  Occupational History   Occupation: Teaching laboratory technician: [employer]  Tobacco Use   Smoking status: Former    Current packs/day: 0.00    Types: Cigarettes    Quit date: 04/19/1968    Years since quitting: 54.9   Smokeless tobacco: Never  Vaping Use   Vaping status: Never Used  Substance and Sexual Activity   Alcohol use: Yes    Alcohol/week: 0.0 standard drinks of alcohol    Comment: Rare 1 glass wine per month   Drug use: No   Sexual activity: Not on file  Other Topics Concern   Not on file  Social History Narrative   Widower - husband passed away October 29, 2016 (amyloid)   Has 1 dog (Yorkie)   Occupation: retired, 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   Activity: kayak, restarting treadmill, wears fit bit -  wants to get to 10000steps/day   Diet: My.fitness.pal 1200cal/day, good water, fruits/vegetables daily   Social Determinants of Health   Financial Resource Strain: Low Risk  (03/03/2023)   Overall Financial Resource Strain (CARDIA)    Difficulty of Paying Living Expenses: Not hard at all  Food Insecurity: No Food Insecurity (03/03/2023)   Hunger Vital Sign    Worried About Running Out of Food in the Last Year: Never true    Ran Out of Food in the Last Year: Never true  Transportation Needs: No Transportation Needs (03/03/2023)   PRAPARE - Administrator, Civil Service (Medical): No    Lack of Transportation (Non-Medical): No  Physical Activity: Insufficiently Active (03/03/2023)   Exercise Vital Sign    Days of Exercise per Week: 7 days    Minutes of Exercise per Session: 20 min  Stress: No Stress Concern Present (03/03/2023)   [employer] of Occupational Health - Occupational Stress Questionnaire    Feeling of Stress : Not at all  Social Connections: Moderately Isolated (03/03/2023)   Social Connection and Isolation Panel [NHANES]    Frequency of Communication with Friends and Family: More than three times a week    Frequency of Social Gatherings with Friends and Family:  More than three times a week    Attends Religious Services: 1 to 4 times per year    Active Member of [employer] or Organizations: No    Attends Banker Meetings: Never    Marital Status: Widowed    Tobacco Counseling Counseling given: Not Answered   Clinical Intake:  Pre-visit preparation completed: Yes  Pain : No/denies pain Pain Score: 0-No pain     BMI - recorded: 25.79 Nutritional Status: BMI 25 -29 Overweight Nutritional Risks: None Diabetes: No  How often do you need to have someone help you when you read instructions, pamphlets, or other written materials from your doctor or pharmacy?: 1 - Never What is the last grade level you completed in school?: GRADUATE SCHOOL  Interpreter Needed?: No  Information entered by :: Christee Mervine N. Clide Remmers, LPN.   Activities of Daily Living    03/03/2023    2:31 PM 02/28/2023    4:27 PM  In your present state of health, do you have any difficulty performing the following activities:  Hearing? 1 1  Vision? 0 0  Difficulty concentrating or making decisions? 0 0  Walking or climbing stairs? 0 0  Dressing or bathing? 0 0  Doing errands, shopping? 0 0  Preparing Food and eating ? N N  Using the Toilet? N N  In the past six months, have you accidently leaked urine? N N  Do you have problems with loss of bowel control? N N  Managing your Medications? N N  Managing your Finances? N N  Housekeeping or managing your Housekeeping? N N    Patient Care Team: Eustaquio Boyden, MD as PCP - General Pernell Dupre Isabella Bowens, [facility] (Inactive) as Pharmacist (Pharmacist) Lockie Mola, MD as Referring Physician (Ophthalmology)  Indicate any recent Medical Services you may have received from other than [facility] providers in the past year (date may be approximate).     Assessment:   This is a routine wellness examination for Gibson.  Hearing/Vision screen Hearing Screening - Comments:: Patient has hearing difficulty and wears  hearing aids. TREATED BY: [place] ent Vision Screening - Comments:: Patient does wear readers.  Bilateral cataracts removed. Annual eye exam done by: [facility] with Deirdre Evener,  MD    Goals Addressed             This Visit's Progress    My goal for 2025 is to maintain my health by watching my HgA1C and weight.        Depression Screen    03/03/2023    2:25 PM 09/06/2022    9:37 AM 02/26/2022   12:44 PM 03/03/2021   12:32 PM 01/16/2020   10:03 AM 01/17/2019   10:19 AM 01/10/2018    9:51 AM  PHQ 2/9 Scores  PHQ - 2 Score 0 0 0 1 0 0 1  PHQ- 9 Score 0   4 0  2    Fall Risk    03/03/2023    2:32 PM 02/28/2023    4:27 PM 09/06/2022    9:36 AM 02/26/2022   12:44 PM 03/03/2021   11:47 AM  Fall Risk   Falls in the past year? 0 0 0 0 0  Number falls in past yr: 0 0  0   Injury with Fall? 0 0  0   Risk for fall due to : No Fall Risks      Follow up Falls prevention discussed   Falls evaluation completed;Falls prevention discussed     MEDICARE RISK AT HOME: Medicare Risk at Home Any stairs in or around the home?: Yes If so, are there any without handrails?: Yes Home free of loose throw rugs in walkways, pet beds, electrical cords, etc?: Yes Adequate lighting in your home to reduce risk of falls?: Yes Life alert?: No Use of a cane, walker or w/c?: No Grab bars in the bathroom?: No Shower chair or bench in shower?: No Elevated toilet seat or a handicapped toilet?: No  TIMED UP AND GO:  Was the test performed?  No    Cognitive Function:    03/03/2023    2:26 PM 01/16/2020   10:05 AM 01/10/2018    9:51 AM  MMSE - Mini Mental State Exam  Not completed: Unable to complete    Orientation to time  5 5  Orientation to Place  5 5  Registration  3 3  Attention/ Calculation  5 0  Recall  3 3  Language- name 2 objects   0  Language- repeat  1 1  Language- follow 3 step command   3  Language- read & follow direction   0  Write a sentence   0   Copy design   0  Total score   20        03/03/2023    2:26 PM 02/26/2022   12:49 PM  6CIT Screen  What Year? 0 points 0 points  What month? 0 points 0 points  What time? 0 points 0 points  Count back from 20 0 points 0 points  Months in reverse 0 points 0 points  Repeat phrase 0 points 0 points  Total Score 0 points 0 points    Immunizations Immunization History  Administered Date(s) Administered   Fluad Quad(high Dose 65+) 01/17/2019, 01/27/2021   Influenza Split 02/09/2012   Influenza Whole 01/17/2013   Influenza, High Dose Seasonal PF 01/18/2020, 01/25/2022, 12/27/2022   Influenza, Seasonal, Injecte, Preservative Fre 01/13/2015   Influenza,inj,Quad PF,6+ Mos 01/18/2014, 01/10/2018   Influenza-Unspecified 01/18/2016, 01/26/2017   PFIZER Comirnaty(Gray Top)Covid-19 Tri-Sucrose Vaccine 10/23/2020   PFIZER(Purple Top)SARS-COV-2 Vaccination 05/26/2019, 06/20/2019, 02/18/2020   Pfizer Covid-19 Vaccine Bivalent Booster 42yrs & up 01/27/2021   Pfizer(Comirnaty)Fall Seasonal Vaccine 12 years and older  01/25/2022, 12/27/2022   Pneumococcal Conjugate-13 09/12/2014   Pneumococcal Polysaccharide-23 02/14/2013   Td 10/02/2007   Tdap 01/11/2018, 10/13/2018   Zoster Recombinant(Shingrix) 10/15/2019, 12/27/2019    TDAP status: Up to date  Flu Vaccine status: Up to date  Pneumococcal vaccine status: Up to date  Covid-19 vaccine status: Completed vaccines  Qualifies for Shingles Vaccine? Yes   Zostavax completed No   Shingrix Completed?: Yes  Screening Tests Health Maintenance  Topic Date Due   COVID-19 Vaccine (8 - 2023-24 season) 02/21/2023   Diabetic kidney evaluation - eGFR measurement  03/02/2023   Diabetic kidney evaluation - Urine ACR  03/02/2023   MAMMOGRAM  04/06/2023   HEMOGLOBIN A1C  06/09/2023   FOOT EXAM  09/06/2023   OPHTHALMOLOGY EXAM  01/24/2024   Medicare Annual Wellness (AWV)  03/02/2024   Colonoscopy  06/02/2025   DTaP/Tdap/Td (4 - Td or Tdap)  10/12/2028   Pneumonia Vaccine 106+ Years old  Completed   INFLUENZA VACCINE  Completed   DEXA SCAN  Completed   Hepatitis C Screening  Completed   Zoster Vaccines- Shingrix  Completed   HPV VACCINES  Aged Out    Health Maintenance  Health Maintenance Due  Topic Date Due   COVID-19 Vaccine (8 - 2023-24 season) 02/21/2023   Diabetic kidney evaluation - eGFR measurement  03/02/2023   Diabetic kidney evaluation - Urine ACR  03/02/2023    Colorectal cancer screening: Type of screening: Colonoscopy. Completed 06/03/2015. Repeat every 10 years  Mammogram status: Completed 04/05/2022. Repeat every year  Bone Density status: Completed 04/02/2020. Results reflect: Bone density results: OSTEOPENIA. Repeat every 2-3 years.  Lung Cancer Screening: (Low Dose CT Chest recommended if Age 47-80 years, 20 pack-year currently smoking OR have quit w/in 15years.) does not qualify.   Lung Cancer Screening Referral: no  Additional Screening:  Hepatitis C Screening: does qualify; Completed 10/28/2015  Vision Screening: Recommended annual ophthalmology exams for early detection of glaucoma and other disorders of the eye. Is the patient up to date with their annual eye exam?  Yes  Who is the provider or what is the name of the office in which the patient attends annual eye exams? Deirdre Evener, MD If pt is not established with a provider, would they like to be referred to a provider to establish care? No .   Dental Screening: Recommended annual dental exams for proper oral hygiene  Diabetic Foot Exam: Diabetic Foot Exam: Completed 09/06/2022  Community Resource Referral / Chronic Care Management: CRR required this visit?  No   CCM required this visit?  No     Plan:     I have personally reviewed and noted the following in the patient's chart:   Medical and social history Use of alcohol, tobacco or illicit drugs  Current medications and supplements including opioid prescriptions.  Patient is not currently taking opioid prescriptions. Functional ability and status Nutritional status Physical activity Advanced directives List of other physicians Hospitalizations, surgeries, and ER visits in previous 12 months Vitals Screenings to include cognitive, depression, and falls Referrals and appointments  In addition, I have reviewed and discussed with patient certain preventive protocols, quality metrics, and best practice recommendations. A written personalized care plan for preventive services as well as general preventive health recommendations were provided to patient.     Mickeal Needy, LPN   [id number]/52/[id number]   After Visit Summary: (MyChart) Due to this being a telephonic visit, the after visit summary with patients personalized plan was offered to  patient via MyChart   Nurse Notes: None

## 2023-03-03 NOTE — Patient Instructions (Signed)
Ms. Betty Sanders , Thank you for taking time to come for your Medicare Wellness Visit. I appreciate your ongoing commitment to your health goals. Please review the following plan we discussed and let me know if I can assist you in the future.   Referrals/Orders/Follow-Ups/Clinician Recommendations: No  This is a list of the screening recommended for you and due dates:  Health Maintenance  Topic Date Due   COVID-19 Vaccine (8 - 2023-24 season) 02/21/2023   Yearly kidney function blood test for diabetes  03/02/2023   Yearly kidney health urinalysis for diabetes  03/02/2023   Mammogram  04/06/2023   Hemoglobin A1C  06/09/2023   Complete foot exam   09/06/2023   Eye exam for diabetics  01/24/2024   Medicare Annual Wellness Visit  03/02/2024   Colon Cancer Screening  06/02/2025   DTaP/Tdap/Td vaccine (4 - Td or Tdap) 10/12/2028   Pneumonia Vaccine  Completed   Flu Shot  Completed   DEXA scan (bone density measurement)  Completed   Hepatitis C Screening  Completed   Zoster (Shingles) Vaccine  Completed   HPV Vaccine  Aged Out    Advanced directives: (In Chart) A copy of your advanced directives are scanned into your chart should your provider ever need it.  Next Medicare Annual Wellness Visit scheduled for next year: No

## 2023-03-05 ENCOUNTER — Other Ambulatory Visit: Payer: Self-pay | Admitting: Family Medicine

## 2023-03-05 DIAGNOSIS — M85852 Other specified disorders of bone density and structure, left thigh: Secondary | ICD-10-CM

## 2023-03-05 DIAGNOSIS — K760 Fatty (change of) liver, not elsewhere classified: Secondary | ICD-10-CM

## 2023-03-05 DIAGNOSIS — E1169 Type 2 diabetes mellitus with other specified complication: Secondary | ICD-10-CM

## 2023-03-07 ENCOUNTER — Other Ambulatory Visit (INDEPENDENT_AMBULATORY_CARE_PROVIDER_SITE_OTHER): Payer: PPO

## 2023-03-07 DIAGNOSIS — E785 Hyperlipidemia, unspecified: Secondary | ICD-10-CM

## 2023-03-07 DIAGNOSIS — K760 Fatty (change of) liver, not elsewhere classified: Secondary | ICD-10-CM

## 2023-03-07 DIAGNOSIS — E1169 Type 2 diabetes mellitus with other specified complication: Secondary | ICD-10-CM | POA: Diagnosis not present

## 2023-03-07 DIAGNOSIS — M85852 Other specified disorders of bone density and structure, left thigh: Secondary | ICD-10-CM | POA: Diagnosis not present

## 2023-03-07 LAB — LIPID PANEL
Cholesterol: 176 mg/dL (ref 0–200)
HDL: 45.1 mg/dL (ref >39.00)
LDL Cholesterol: 102 mg/dL — ABNORMAL HIGH (ref 0–99)
NonHDL: 131.11
Total CHOL/HDL Ratio: 4
Triglycerides: 147 mg/dL (ref 0.0–149.0)
VLDL: 29.4 mg/dL (ref 0.0–40.0)

## 2023-03-07 LAB — MICROALBUMIN / CREATININE URINE RATIO
Creatinine,U: 65.2 mg/dL
Microalb Creat Ratio: 1.1 mg/g (ref 0.0–30.0)
Microalb, Ur: <0.7 mg/dL (ref 0.0–1.9)

## 2023-03-07 LAB — VITAMIN B12: Vitamin B-12: 182 pg/mL — ABNORMAL LOW (ref 211–911)

## 2023-03-07 LAB — COMPREHENSIVE METABOLIC PANEL
ALT: 11 U/L (ref 0–35)
AST: 16 U/L (ref 0–37)
Albumin: 4.2 g/dL (ref 3.5–5.2)
Alkaline Phosphatase: 84 U/L (ref 39–117)
BUN: 14 mg/dL (ref 6–23)
CO2: 27 meq/L (ref 19–32)
Calcium: 9.1 mg/dL (ref 8.4–10.5)
Chloride: 102 meq/L (ref 96–112)
Creatinine, Ser: 0.62 mg/dL (ref 0.40–1.20)
GFR: 87.24 mL/min (ref >60.00)
Glucose, Bld: 157 mg/dL — ABNORMAL HIGH (ref 70–99)
Potassium: 4.2 meq/L (ref 3.5–5.1)
Sodium: 137 meq/L (ref 135–145)
Total Bilirubin: 0.4 mg/dL (ref 0.2–1.2)
Total Protein: 6.8 g/dL (ref 6.0–8.3)

## 2023-03-07 LAB — TSH: TSH: 3.63 u[IU]/mL (ref 0.35–5.50)

## 2023-03-07 LAB — VITAMIN D 25 HYDROXY (VIT D DEFICIENCY, FRACTURES): VITD: 45.58 ng/mL (ref 30.00–100.00)

## 2023-03-07 LAB — HEMOGLOBIN A1C: Hgb A1c MFr Bld: 6.6 % — ABNORMAL HIGH (ref 4.6–6.5)

## 2023-03-14 ENCOUNTER — Encounter: Payer: Self-pay | Admitting: Family Medicine

## 2023-03-14 ENCOUNTER — Ambulatory Visit (INDEPENDENT_AMBULATORY_CARE_PROVIDER_SITE_OTHER): Payer: PPO | Admitting: Family Medicine

## 2023-03-14 VITALS — BP 122/64 | HR 60 | Temp 97.8°F | Ht 63.5 in | Wt 158.5 lb

## 2023-03-14 DIAGNOSIS — E1169 Type 2 diabetes mellitus with other specified complication: Secondary | ICD-10-CM | POA: Diagnosis not present

## 2023-03-14 DIAGNOSIS — E785 Hyperlipidemia, unspecified: Secondary | ICD-10-CM

## 2023-03-14 DIAGNOSIS — Z0000 Encounter for general adult medical examination without abnormal findings: Secondary | ICD-10-CM

## 2023-03-14 DIAGNOSIS — E538 Deficiency of other specified B group vitamins: Secondary | ICD-10-CM | POA: Insufficient documentation

## 2023-03-14 DIAGNOSIS — Z7189 Other specified counseling: Secondary | ICD-10-CM | POA: Diagnosis not present

## 2023-03-14 DIAGNOSIS — M791 Myalgia, unspecified site: Secondary | ICD-10-CM | POA: Diagnosis not present

## 2023-03-14 DIAGNOSIS — M85852 Other specified disorders of bone density and structure, left thigh: Secondary | ICD-10-CM

## 2023-03-14 DIAGNOSIS — R011 Cardiac murmur, unspecified: Secondary | ICD-10-CM | POA: Diagnosis not present

## 2023-03-14 DIAGNOSIS — T466X5A Adverse effect of antihyperlipidemic and antiarteriosclerotic drugs, initial encounter: Secondary | ICD-10-CM

## 2023-03-14 MED ORDER — TRULICITY 0.75 MG/0.5ML ~~LOC~~ SOAJ: 0.7500 mg | SUBCUTANEOUS | 4 refills | Status: DC

## 2023-03-14 MED ORDER — VITAMIN B-12 1000 MCG PO TABS: 1000.0000 ug | ORAL_TABLET | Freq: Every day | ORAL | Status: DC

## 2023-03-14 MED ORDER — METFORMIN HCL ER 500 MG PO TB24: 1000.0000 mg | ORAL_TABLET | Freq: Two times a day (BID) | ORAL | 4 refills | Status: DC

## 2023-03-14 MED ORDER — EZETIMIBE 10 MG PO TABS: 10.0000 mg | ORAL_TABLET | Freq: Every day | ORAL | 4 refills | Status: DC

## 2023-03-14 MED ORDER — CYANOCOBALAMIN 1000 MCG/ML IJ SOLN
1000.0000 ug | Freq: Once | INTRAMUSCULAR | Status: AC
  Administered 2023-03-14: 1000 ug via INTRAMUSCULAR

## 2023-03-14 NOTE — Assessment & Plan Note (Signed)
Likely aortic sclerosis related.

## 2023-03-14 NOTE — Assessment & Plan Note (Signed)
Myalgias to even low dose weekly statin - will remain off. Continue zetia.

## 2023-03-14 NOTE — Assessment & Plan Note (Addendum)
Statin intolerance. Chronic, stable period on zetia. Reviewed diet choices to improve LDL levels. She declines further lipid medication.  The 10-year ASCVD risk score (Arnett DK, et al., 2019) is: 26.2%   Values used to calculate the score:     Age: 77 years     Sex: Female     Is Non-Hispanic African American: No     Diabetic: Yes     Tobacco smoker: No     Systolic Blood Pressure: 122 mmHg     Is BP treated: No     HDL Cholesterol: 45.1 mg/dL     Total Cholesterol: 176 mg/dL

## 2023-03-14 NOTE — Assessment & Plan Note (Addendum)
Anticipate metformin-related deficiency.  B12 shot today then start oral B12 daily. Consider checking for pernicious anemia (father with h/o B12 def as well).

## 2023-03-14 NOTE — Assessment & Plan Note (Signed)
Preventative protocols reviewed and updated unless pt declined. Discussed healthy diet and lifestyle.  

## 2023-03-14 NOTE — Assessment & Plan Note (Signed)
Previously discussed.

## 2023-03-14 NOTE — Patient Instructions (Addendum)
B12 shot today then start b12 over the counter daily.  Good to see you today  Return as needed or in 6 months for diabetes follow up visit

## 2023-03-14 NOTE — Assessment & Plan Note (Signed)
Continue regular walking, dietary cal , vit D daily.

## 2023-03-14 NOTE — Assessment & Plan Note (Signed)
Chronic, stable. Continue current regimen.  Notes benefit with CGM however not covered by insurance. She may look into OTC Dexcom Stelo.  RTC 6 mo DM f/u visit .

## 2023-03-14 NOTE — Progress Notes (Signed)
Ph: [phone] Fax: [phone]   Patient ID: Betty Sanders, female    DOB: [birth year], 77 y.o.   MRN: [id number]  This visit was conducted in person.  BP 122/64   Pulse 60   Temp 97.8 F (36.6 C) (Oral)   Ht 5' 3.5" (1.613 m)   Wt 158 lb 8 oz (71.9 kg)   SpO2 99%   BMI 27.64 kg/m    CC: CPE Subjective:   HPI: Betty Sanders is a 77 y.o. female presenting on 03/14/2023 for Annual Exam (MCR prt 2 [AWV- 03/03/23].)   Saw health advisor last week for medicare wellness visit. Note reviewed.   No results found.  Flowsheet Row Clinical Support from 03/03/2023 in [facility] [place]  PHQ-2 Total Score 0          03/03/2023    2:32 PM 02/28/2023    4:27 PM 09/06/2022    9:36 AM 02/26/2022   12:44 PM 03/03/2021   11:47 AM  Fall Risk   Falls in the past year? 0 0 0 0 0  Number falls in past yr: 0 0  0   Injury with Fall? 0 0  0   Risk for fall due to : No Fall Risks      Follow up Falls prevention discussed   Falls evaluation completed;Falls prevention discussed    Ongoing hearing difficulty coming from sinuses - has seen ENT and audiology. Started on nasal spray without significant benefit. Hearing aides are only somewhat helpful.   HLD - continues zetia 10mg  daily. She stopped crestor 6 months ago - stopped due to myalgias.   DM - continues  metformin XR 1000mg  bid and trulciity 0.75mg  weekly. Has stopped amaryl 1/2 tab. Fasting this morning 109. Recent steroid course for sinuses.   Preventative: COLONOSCOPY Date: 05/2015 diverticulosis, rpt 10 yrs [facility] Payor) Mammogram 03/2022, [facility] 1 @ [place].  Well woman exam - always normal. Last 10/2016. Fmhx (aunt) GYN cancer, unsure type. Will age out. no pelvic pain, vaginal bleeding, skin changes.  DEXA (03/2020) T -1.7 (L femur). Good yogurt and ice cream and weight bearing exercise.  Lung cancer screen - not eligible  Flu shot yearly  COVID vaccine - Pfizer 05/2019, 06/2019, Pfizer  booster 02/2020, 10/2020, bivalent 01/2021, 01/2022  Td 2009, Tdap 2019, 2020 Pneumovax 01/2013, prevnar-13 2016 Shingrix - completed 09/2019, 12/2019  Advanced planning - scanned into chart 2022. Sister [name] is HCPOA. does not want prolonged life support if terminal condition. Wants HCPOA recommendations followed.  Seat belt use discussed Sunscreen use discussed - no changing moles on skin. Sees dermatology Dr Yetta Barre q9mo. 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 Activity: kayak, restarting treadmill, wears fit bit - wants to get to 10000steps/day Diet: My.fitness.pal 1200cal/day, good water, fruits/vegetables daily      Relevant past medical, surgical, family and social history reviewed and updated as indicated. Interim medical history since our last visit reviewed. Allergies and medications reviewed and updated. Outpatient Medications Prior to Visit  Medication Sig Dispense Refill   Blood Glucose Monitoring Suppl (ONETOUCH VERIO IQ SYSTEM) w/Device KIT Check blood sugar twice a day and as directed. Dx E11.9 1 kit 0   Cholecalciferol (VITAMIN D3) 25 MCG (1000 UT) CAPS Take  1 capsule (1,000 Units total) by mouth daily. 30 capsule    Coenzyme Q10 (COQ10) 50 MG CAPS Take 1 capsule by mouth in the morning and at bedtime.     glucose blood (ONETOUCH VERIO) test strip Check blood sugar twice a day. 200 strip 3   ONETOUCH DELICA LANCETS 33G MISC Check blood sugar twice a day and as directed. Dx E11.9 100 each 6   Probiotic Product (PROBIOTIC PO) Take by mouth daily.     triamcinolone (NASACORT) 55 MCG/ACT AERO nasal inhaler 2 sprays 2 (two) times daily.     vitamin C (ASCORBIC ACID) 500 MG tablet Take 500 mg by mouth as needed.     Dulaglutide (TRULICITY) 0.75 MG/0.5ML SOPN INJECT 0.75 MG  INTO THE SKIN ONCE A WEEK. 6 mL 3   ezetimibe (ZETIA) 10 MG tablet TAKE 1 TABLET BY MOUTH EVERY DAY 90 tablet 3   metFORMIN (GLUCOPHAGE-XR) 500 MG 24 hr tablet TAKE 2 TABLETS (1,000 MG TOTAL) BY MOUTH IN THE MORNING AND AT BEDTIME. 360 tablet 3   imiquimod (ALDARA) 5 % cream      No facility-administered medications prior to visit.     Per HPI unless specifically indicated in ROS section below Review of Systems  Constitutional:  Negative for activity change, appetite change, chills, fatigue, fever and unexpected weight change.  HENT:  Negative for hearing loss.   Eyes:  Negative for visual disturbance.  Respiratory:  Negative for cough, chest tightness, shortness of breath and wheezing.   Cardiovascular:  Negative for chest pain, palpitations and leg swelling.  Gastrointestinal:  Negative for abdominal distention, abdominal pain, blood in stool, constipation, diarrhea, nausea and vomiting.  Genitourinary:  Negative for difficulty urinating and hematuria.  Musculoskeletal:  Negative for arthralgias, myalgias and neck pain.  Skin:  Negative for rash.  Neurological:  Negative for dizziness, seizures, syncope and headaches.       Ocular migraine x1 - 2 months ago  Hematological:  Negative for adenopathy. Does not bruise/bleed easily.  Psychiatric/Behavioral:  Negative for dysphoric mood. The patient is not nervous/anxious.     Objective:  BP 122/64   Pulse 60   Temp 97.8 F (36.6 C) (Oral)   Ht 5' 3.5" (1.613 m)   Wt 158 lb 8 oz (71.9 kg)   SpO2 99%   BMI 27.64 kg/m   Wt Readings from Last 3 Encounters:  03/14/23 158 lb 8 oz (71.9 kg)  03/03/23 155 lb (70.3 kg)  09/06/22 165 lb 2 oz (74.9 kg)      Physical Exam Vitals and nursing note reviewed.  Constitutional:      Appearance: Normal appearance. She is not ill-appearing.  HENT:     Head: Normocephalic and atraumatic.     Right Ear: Tympanic membrane, ear canal and external ear normal. There is no impacted cerumen.      Left Ear: Tympanic membrane, ear canal and external ear normal. There is no impacted cerumen.     Mouth/Throat:     Mouth: Mucous membranes are moist.     Pharynx: Oropharynx is clear. No oropharyngeal exudate or posterior oropharyngeal erythema.  Eyes:     General:        Right eye: No discharge.        Left eye: No discharge.     Extraocular Movements: Extraocular movements intact.     Conjunctiva/sclera: Conjunctivae normal.     Pupils: Pupils are equal, round, and reactive to light.  Neck:  Thyroid: No thyroid mass or thyromegaly.     Vascular: Carotid bruit (referred from cardiac murmur) present.  Cardiovascular:     Rate and Rhythm: Normal rate and regular rhythm.     Pulses: Normal pulses.     Heart sounds: Murmur (3/6 systolic, known AV sclerosis) heard.  Pulmonary:     Effort: Pulmonary effort is normal. No respiratory distress.     Breath sounds: Normal breath sounds. No wheezing, rhonchi or rales.  Abdominal:     General: Bowel sounds are normal. There is no distension.     Palpations: Abdomen is soft. There is no mass.     Tenderness: There is no abdominal tenderness. There is no guarding or rebound.     Hernia: No hernia is present.  Musculoskeletal:     Cervical back: Normal range of motion and neck supple. No rigidity.     Right lower leg: No edema.     Left lower leg: No edema.  Lymphadenopathy:     Cervical: No cervical adenopathy.  Skin:    General: Skin is warm and dry.     Findings: No rash.  Neurological:     General: No focal deficit present.     Mental Status: She is alert. Mental status is at baseline.  Psychiatric:        Mood and Affect: Mood normal.        Behavior: Behavior normal.       Results for orders placed or performed in visit on 03/07/23  Vitamin B12  Result Value Ref Range   Vitamin B-12 182 (L) 211 - 911 pg/mL  VITAMIN D 25 Hydroxy (Vit-D Deficiency, Fractures)  Result Value Ref Range   VITD 45.58 30.00 - 100.00 ng/mL  TSH   Result Value Ref Range   TSH 3.63 0.35 - 5.50 uIU/mL  Microalbumin / creatinine urine ratio  Result Value Ref Range   Microalb, Ur <0.7 0.0 - 1.9 mg/dL   Creatinine,U 16.1 mg/dL   Microalb Creat Ratio 1.1 0.0 - 30.0 mg/g  Hemoglobin A1c  Result Value Ref Range   Hgb A1c MFr Bld 6.6 (H) 4.6 - 6.5 %  Comprehensive metabolic panel  Result Value Ref Range   Sodium 137 135 - 145 mEq/L   Potassium 4.2 3.5 - 5.1 mEq/L   Chloride 102 96 - 112 mEq/L   CO2 27 19 - 32 mEq/L   Glucose, Bld 157 (H) 70 - 99 mg/dL   BUN 14 6 - 23 mg/dL   Creatinine, Ser 0.96 0.40 - 1.20 mg/dL   Total Bilirubin 0.4 0.2 - 1.2 mg/dL   Alkaline Phosphatase 84 39 - 117 U/L   AST 16 0 - 37 U/L   ALT 11 0 - 35 U/L   Total Protein 6.8 6.0 - 8.3 g/dL   Albumin 4.2 3.5 - 5.2 g/dL   GFR 04.54 >09.81 mL/min   Calcium 9.1 8.4 - 10.5 mg/dL  Lipid panel  Result Value Ref Range   Cholesterol 176 0 - 200 mg/dL   Triglycerides 191.4 0.0 - 149.0 mg/dL   HDL 78.29 >56.21 mg/dL   VLDL 30.8 0.0 - 65.7 mg/dL   LDL Cholesterol 846 (H) 0 - 99 mg/dL   Total CHOL/HDL Ratio 4    NonHDL 131.11    Lab Results  Component Value Date   WBC 5.9 03/01/2022   HGB 12.7 03/01/2022   HCT 38.4 03/01/2022   MCV 77.2 (L) 03/01/2022   PLT 331.0 03/01/2022  Assessment & Plan:   Problem List Items Addressed This Visit     Healthcare maintenance - Primary (Chronic)    Preventative protocols reviewed and updated unless pt declined. Discussed healthy diet and lifestyle.       Advanced care planning/counseling discussion (Chronic)    Previously discussed      Type 2 diabetes mellitus with other specified complication (HCC)    Chronic, stable. Continue current regimen.  Notes benefit with CGM however not covered by insurance. She may look into OTC Dexcom Stelo.  RTC 6 mo DM f/u visit .      Relevant Medications   Dulaglutide (TRULICITY) 0.75 MG/0.5ML SOAJ   metFORMIN (GLUCOPHAGE-XR) 500 MG 24 hr tablet   Hyperlipidemia  associated with type 2 diabetes mellitus (HCC)    Statin intolerance. Chronic, stable period on zetia. Reviewed diet choices to improve LDL levels. She declines further lipid medication.  The 10-year ASCVD risk score (Arnett DK, et al., 2019) is: 26.2%   Values used to calculate the score:     Age: 24 years     Sex: Female     Is Non-Hispanic African American: No     Diabetic: Yes     Tobacco smoker: No     Systolic Blood Pressure: 122 mmHg     Is BP treated: No     HDL Cholesterol: 45.1 mg/dL     Total Cholesterol: 176 mg/dL       Relevant Medications   Dulaglutide (TRULICITY) 0.75 MG/0.5ML SOAJ   ezetimibe (ZETIA) 10 MG tablet   metFORMIN (GLUCOPHAGE-XR) 500 MG 24 hr tablet   Osteopenia    Continue regular walking, dietary cal , vit D daily.       Systolic murmur    Likely aortic sclerosis related.       Myalgia due to statin    Myalgias to even low dose weekly statin - will remain off. Continue zetia.       Vitamin B12 deficiency    Anticipate metformin-related deficiency.  B12 shot today then start oral B12 daily. Consider checking for pernicious anemia (father with h/o B12 def as well).         Meds ordered this encounter  Medications   Dulaglutide (TRULICITY) 0.75 MG/0.5ML SOAJ    Sig: Inject 0.75 mg into the skin once a week.    Dispense:  6 mL    Refill:  4   ezetimibe (ZETIA) 10 MG tablet    Sig: Take 1 tablet (10 mg total) by mouth daily.    Dispense:  90 tablet    Refill:  4   metFORMIN (GLUCOPHAGE-XR) 500 MG 24 hr tablet    Sig: Take 2 tablets (1,000 mg total) by mouth in the morning and at bedtime.    Dispense:  360 tablet    Refill:  4   cyanocobalamin (VITAMIN B12) 1000 MCG tablet    Sig: Take 1 tablet (1,000 mcg total) by mouth daily.   cyanocobalamin (VITAMIN B12) injection 1,000 mcg    No orders of the defined types were placed in this encounter.   Patient Instructions  B12 shot today then start b12 over the counter  daily.  Good to see you today  Return as needed or in 6 months for diabetes follow up visit   Follow up plan: Return in about 6 months (around 09/11/2023) for follow up visit.  Eustaquio Boyden, MD

## 2023-03-23 DIAGNOSIS — D485 Neoplasm of uncertain behavior of skin: Secondary | ICD-10-CM | POA: Diagnosis not present

## 2023-03-23 DIAGNOSIS — Z85828 Personal history of other malignant neoplasm of skin: Secondary | ICD-10-CM | POA: Diagnosis not present

## 2023-03-23 DIAGNOSIS — L905 Scar conditions and fibrosis of skin: Secondary | ICD-10-CM | POA: Diagnosis not present

## 2023-04-07 ENCOUNTER — Ambulatory Visit
Admission: RE | Admit: 2023-04-07 | Discharge: 2023-04-07 | Disposition: A | Discharge status: Home / Self Care | Payer: PPO | Source: Ambulatory Visit | Attending: Family Medicine | Admitting: Family Medicine

## 2023-04-07 DIAGNOSIS — Z1231 Encounter for screening mammogram for malignant neoplasm of breast: Secondary | ICD-10-CM | POA: Insufficient documentation

## 2023-06-24 ENCOUNTER — Telehealth: Payer: Self-pay

## 2023-06-24 ENCOUNTER — Other Ambulatory Visit (HOSPITAL_COMMUNITY): Payer: Self-pay

## 2023-06-24 ENCOUNTER — Telehealth: Payer: Self-pay | Admitting: Family Medicine

## 2023-06-24 NOTE — Telephone Encounter (Signed)
 Noted (see other 06/24/23 phn note). Also, see other message in this encounter by clicking blue '[id number] [place]' link.

## 2023-06-24 NOTE — Telephone Encounter (Signed)
 Copied from CRM [id number]. Topic: Clinical - Prescription Issue >> Jun 24, 2023 12:52 PM Eunice Blase wrote: Reason for CRM: Pt called insurance needs prior authorization for Dulaglutide (TRULICITY) 0.75 MG/0.5ML SOAJ. Please call pt when completed at [phone].

## 2023-06-24 NOTE — Telephone Encounter (Signed)
 Pharmacy Patient Advocate Encounter  Received notification from [facility] [employer] that Prior Authorization for Trulicity 0.75mg /0.36ml  has been APPROVED from 06/24/23 to 06/23/24   PA #/Case ID/Reference #: [id number]

## 2023-06-24 NOTE — Telephone Encounter (Signed)
 Pharmacy Patient Advocate Encounter   Received notification from Pt Calls Messages that prior authorization for Trulicity 0.75mg /0.32ml is required/requested.   Insurance verification completed.   The patient is insured through [facility] [employer]/RX ADVANCE .   Per test claim: PA required; PA submitted to above mentioned insurance via CoverMyMeds Key/confirmation #/EOC [id number] Status is pending

## 2023-06-27 ENCOUNTER — Encounter: Payer: Self-pay | Admitting: Family Medicine

## 2023-06-27 NOTE — Telephone Encounter (Signed)
 Spoke with Trinna Post at [facility] notifying him of PA approval. Was able to process claim and will fill for pt.

## 2023-06-27 NOTE — Telephone Encounter (Signed)
 Spoke with pt notifying her of PA approval and that we informed [facility] this AM (see 06/24/23 phn note). Pt verbalizes understanding stating she received a call from [facility] notifying her rx is ready for pick up and the cost is $174. Says she was told it would be cheaper than that. Pt states she will go to [facility] to speak with pharmacist face-to-face. She expresses her thanks for the call.

## 2023-06-29 DIAGNOSIS — L245 Irritant contact dermatitis due to other chemical products: Secondary | ICD-10-CM | POA: Diagnosis not present

## 2023-06-29 DIAGNOSIS — L570 Actinic keratosis: Secondary | ICD-10-CM | POA: Diagnosis not present

## 2023-06-29 DIAGNOSIS — L821 Other seborrheic keratosis: Secondary | ICD-10-CM | POA: Diagnosis not present

## 2023-06-29 DIAGNOSIS — Z85828 Personal history of other malignant neoplasm of skin: Secondary | ICD-10-CM | POA: Diagnosis not present

## 2023-08-24 ENCOUNTER — Encounter (HOSPITAL_COMMUNITY): Payer: Self-pay

## 2023-08-31 ENCOUNTER — Ambulatory Visit (INDEPENDENT_AMBULATORY_CARE_PROVIDER_SITE_OTHER): Payer: PPO

## 2023-08-31 VITALS — Ht 63.5 in | Wt 158.0 lb

## 2023-08-31 DIAGNOSIS — Z0000 Encounter for general adult medical examination without abnormal findings: Secondary | ICD-10-CM | POA: Diagnosis not present

## 2023-08-31 NOTE — Progress Notes (Signed)
 Please attest and cosign this visit due to patients primary care provider not being in the office at the time the visit was completed.    Subjective:   Betty Sanders is a 78 y.o. who presents for a Medicare Wellness preventive visit.  As a reminder, Annual Wellness Visits don't include a physical exam, and some assessments may be limited, especially if this visit is performed virtually. We may recommend an in-person visit if needed.  Visit Complete: Virtual I connected with  Betty Sanders on 08/31/23 by a audio enabled telemedicine application and verified that I am speaking with the correct person using two identifiers.  Patient Location: Home  Provider Location: Office/Clinic  I discussed the limitations of evaluation and management by telemedicine. The patient expressed understanding and agreed to proceed.  Vital Signs: Because this visit was a virtual/telehealth visit, some criteria may be missing or patient reported. Any vitals not documented were not able to be obtained and vitals that have been documented are patient reported.  VideoDeclined- This patient declined Librarian, academic. Therefore the visit was completed with audio only.  Persons Participating in Visit: Patient.  AWV Questionnaire: completed by patient on 08/30/23  Cardiac Risk Factors include: advanced age (>66men, >24 women);diabetes mellitus;dyslipidemia     Objective:     Today's Vitals   08/31/23 1055  Weight: 158 lb (71.7 kg)  Height: 5' 3.5" (1.613 m)   Body mass index is 27.55 kg/m.     08/31/2023   11:01 AM 03/03/2023    2:22 PM 02/26/2022   12:45 PM 05/21/2020    6:37 AM 05/07/2020    6:49 AM 01/16/2020    9:54 AM 01/10/2018    9:52 AM  Advanced Directives  Does Patient Have a Medical Advance Directive? Yes Yes Yes Yes Yes Yes No  Type of Estate agent of [place];Living will Healthcare Power of [place];Living will Healthcare Power  of [place];Living will Healthcare Power of [place];Living will Healthcare Power of [place];Living will Healthcare Power of [place];Living will   Does patient want to make changes to medical advance directive?  No - Patient declined No - Patient declined No - Guardian declined No - Patient declined    Copy of Healthcare Power of Attorney in Chart? Yes - validated most recent copy scanned in chart (See row information) Yes - validated most recent copy scanned in chart (See row information) Yes - validated most recent copy scanned in chart (See row information) No - copy requested No - copy requested No - copy requested   Would patient like information on creating a medical advance directive?       Yes (MAU/Ambulatory/Procedural Areas - Information given)    Current Medications (verified) Outpatient Encounter Medications as of 08/31/2023  Medication Sig   Blood Glucose Monitoring Suppl (ONETOUCH VERIO IQ SYSTEM) w/Device KIT Check blood sugar twice a day and as directed. Dx E11.9   Cholecalciferol (VITAMIN D3) 25 MCG (1000 UT) CAPS Take 1 capsule (1,000 Units total) by mouth daily.   Coenzyme Q10 (COQ10) 50 MG CAPS Take 1 capsule by mouth in the morning and at bedtime.   cyanocobalamin  (VITAMIN B12) 1000 MCG tablet Take 1 tablet (1,000 mcg total) by mouth daily.   Dulaglutide  (TRULICITY ) 0.75 MG/0.5ML SOAJ Inject 0.75 mg into the skin once a week.   ezetimibe  (ZETIA ) 10 MG tablet Take 1 tablet (10 mg total) by mouth daily.   glucose blood (ONETOUCH VERIO) test strip Check blood sugar twice  a day.   metFORMIN  (GLUCOPHAGE -XR) 500 MG 24 hr tablet Take 2 tablets (1,000 mg total) by mouth in the morning and at bedtime.   ONETOUCH DELICA LANCETS 33G MISC Check blood sugar twice a day and as directed. Dx E11.9   Probiotic Product (PROBIOTIC PO) Take by mouth daily.   triamcinolone (NASACORT) 55 MCG/ACT AERO nasal inhaler 2 sprays 2 (two) times daily.   vitamin C (ASCORBIC ACID) 500 MG tablet Take 500 mg  by mouth as needed.   No facility-administered encounter medications on file as of 08/31/2023.    Allergies (verified) Lipitor [atorvastatin ], Pravastatin , and Sulfa antibiotics   History: Past Medical History:  Diagnosis Date   Bilateral hearing loss 2016   hearing aides   Diabetes mellitus without complication (HCC)    Dyslipidemia    Heart murmur    Hyperlipidemia    Osteopenia 05/2014   DEXA at [facility]   Squamous cell cancer of scalp and skin of neck 2013   s/p excision (Dr. Broadus Canes)   Squamous cell cancer of scalp and skin of neck 01/2021   s/p Mohs (Dr Debrah Fan at [facility]), R superior anterior scalp 04/2021 with base involved   Type 2 diabetes mellitus, uncontrolled 02/2012   DSME completed 07/2014   Wears hearing aid in both ears    Past Surgical History:  Procedure Laterality Date   BELPHAROPTOSIS REPAIR Bilateral 11/2016   CATARACT EXTRACTION W/PHACO Left 05/07/2020   Procedure: CATARACT EXTRACTION PHACO AND INTRAOCULAR LENS PLACEMENT (IOC) LEFT DIABETIC 8.70 01:28.6 9.8%;  Surgeon: Annell Kidney, MD;  Location: [facility];  Service: Ophthalmology;  Laterality: Left;  Diabetic - oral meds   CATARACT EXTRACTION W/PHACO Right 05/21/2020   Procedure: CATARACT EXTRACTION PHACO AND INTRAOCULAR LENS PLACEMENT (IOC) RIGHT DIABETIC 4.82 00:45.2 10.6%;  Surgeon: Annell Kidney, MD;  Location: [facility];  Service: Ophthalmology;  Laterality: Right;   COLONOSCOPY  02/2010   tubular adenoma polyp x 1; repeat 5 years--Dr. Willy Harvest   COLONOSCOPY  05/2015   diverticulosis, rpt 10 yrs Willy Harvest)   DENTAL SURGERY  09/2017   dental implants   DEXA  05/2014   spine -2.1, hip -1.4   GANGLION CYST EXCISION  2006   Left wrist   MOHS SURGERY  02/09/2021   squamous cell of frontal scalp (Dr Debrah Fan at [facility])   RHINOPLASTY  1970's   WRIST SURGERY     Family History  Problem Relation Age of Onset   Lung cancer Father    Colon cancer Cousin    Breast cancer  Paternal Aunt    Cervical cancer Maternal Aunt 92       female cancer   Diabetes Maternal Grandfather    Stroke Paternal Grandfather    CAD Neg Hx    Social History   Socioeconomic History   Marital status: Widowed    Spouse name: Not on file   Number of children: Not on file   Years of education: Not on file   Highest education level: Master's degree (e.g., MA, MS, MEng, MEd, MSW, MBA)  Occupational History   Occupation: Investment banker, corporate: [employer]  Tobacco Use   Smoking status: Former    Current packs/day: 0.00    Types: Cigarettes    Quit date: 04/19/1968    Years since quitting: 55.4   Smokeless tobacco: Never  Vaping Use   Vaping status: Never Used  Substance and Sexual Activity   Alcohol use: Yes    Alcohol/week: 0.0 standard  drinks of alcohol    Comment: Rare 1 glass wine per month   Drug use: No   Sexual activity: Not on file  Other Topics Concern   Not on file  Social History Narrative   Widower - husband passed away 10/17/16 (amyloid)   Has 1 dog (Yorkie)   Occupation: retired, 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   Activity: kayak, restarting treadmill, wears fit bit - wants to get to 10000steps/day   Diet: My.fitness.pal 1200cal/day, good water, fruits/vegetables daily   Social Drivers of Health   Financial Resource Strain: Low Risk  (08/30/2023)   Overall Financial Resource Strain (CARDIA)    Difficulty of Paying Living Expenses: Not very hard  Food Insecurity: No Food Insecurity (08/30/2023)   Hunger Vital Sign    Worried About Running Out of Food in the Last Year: Never true    Ran Out of Food in the Last Year: Never true  Transportation Needs: No Transportation Needs (08/30/2023)   PRAPARE - Administrator, Civil Service (Medical): No    Lack of Transportation (Non-Medical): No  Physical Activity: Insufficiently Active (08/30/2023)   Exercise Vital Sign    Days of Exercise per Week: 7 days     Minutes of Exercise per Session: 20 min  Stress: No Stress Concern Present (08/30/2023)   [employer] of Occupational Health - Occupational Stress Questionnaire    Feeling of Stress : Only a little  Social Connections: Socially Isolated (08/30/2023)   Social Connection and Isolation Panel [NHANES]    Frequency of Communication with Friends and Family: More than three times a week    Frequency of Social Gatherings with Friends and Family: More than three times a week    Attends Religious Services: Never    Database administrator or Organizations: No    Attends Banker Meetings: Never    Marital Status: Widowed    Tobacco Counseling Counseling given: Not Answered    Clinical Intake:  Pre-visit preparation completed: Yes  Pain : No/denies pain     BMI - recorded: 27.55 Nutritional Status: BMI 25 -29 Overweight Nutritional Risks: None Diabetes: Yes CBG done?: No Did pt. bring in CBG monitor from home?: No  Lab Results  Component Value Date   HGBA1C 6.6 (H) 03/07/2023   HGBA1C 6.0 (A) 12/07/2022   HGBA1C 7.8 (H) 08/30/2022     How often do you need to have someone help you when you read instructions, pamphlets, or other written materials from your doctor or pharmacy?: 1 - Never  Interpreter Needed?: No  Information entered by :: B.Sriman Tally,LPN   Activities of Daily Living     08/31/2023   11:01 AM 03/03/2023    2:31 PM  In your present state of health, do you have any difficulty performing the following activities:  Hearing? 1 1  Vision? 0 0  Difficulty concentrating or making decisions? 0 0  Walking or climbing stairs? 0 0  Dressing or bathing? 0 0  Doing errands, shopping? 0 0  Preparing Food and eating ? N N  Using the Toilet? N N  In the past six months, have you accidently leaked urine? N N  Do you have problems with loss of bowel control? N N  Managing your Medications? N N  Managing your Finances? N N  Housekeeping or managing your  Housekeeping? N N    Patient Care Team: Claire Crick, MD as PCP - Gene Kemps, Carmelia China,  RPH (Inactive) as Pharmacist (Pharmacist) Annell Kidney, MD as Referring Physician (Ophthalmology)  Indicate any recent Medical Services you may have received from other than [facility] providers in the past year (date may be approximate).     Assessment:    This is a routine wellness examination for Betty Sanders.  Hearing/Vision screen Hearing Screening - Comments:: Pt says she has severe hearing loss (has hearing aids) Vision Screening - Comments:: Pt says she only has readers:vision is ok Dr Ignatius Makos   Goals Addressed             This Visit's Progress    My goal for 2025 is to maintain my health by watching my HgA1C and weight.   On track    08/31/23 continue       Depression Screen     08/31/2023   10:59 AM 03/03/2023    2:25 PM 09/06/2022    9:37 AM 02/26/2022   12:44 PM 03/03/2021   12:32 PM 01/16/2020   10:03 AM 01/17/2019   10:19 AM  PHQ 2/9 Scores  PHQ - 2 Score 0 0 0 0 1 0 0  PHQ- 9 Score  0   4 0     Fall Risk     08/31/2023   10:58 AM 03/03/2023    2:32 PM 02/28/2023    4:27 PM 09/06/2022    9:36 AM 02/26/2022   12:44 PM  Fall Risk   Falls in the past year? 0 0 0 0 0  Number falls in past yr: 0 0 0  0  Injury with Fall? 0 0 0  0  Risk for fall due to : No Fall Risks No Fall Risks     Follow up Education provided;Falls prevention discussed Falls prevention discussed   Falls evaluation completed;Falls prevention discussed    MEDICARE RISK AT HOME:  Medicare Risk at Home Any stairs in or around the home?: Yes If so, are there any without handrails?: Yes Home free of loose throw rugs in walkways, pet beds, electrical cords, etc?: Yes Adequate lighting in your home to reduce risk of falls?: Yes Life alert?: No Use of a cane, walker or w/c?: No Grab bars in the bathroom?: No Shower chair or bench in shower?: No Elevated toilet seat or a handicapped  toilet?: No  TIMED UP AND GO:  Was the test performed?  No  Cognitive Function: 6CIT completed    03/03/2023    2:26 PM 01/16/2020   10:05 AM 01/10/2018    9:51 AM  MMSE - Mini Mental State Exam  Not completed: Unable to complete    Orientation to time  5 5  Orientation to Place  5 5  Registration  3 3  Attention/ Calculation  5 0  Recall  3 3  Language- name 2 objects   0  Language- repeat  1 1  Language- follow 3 step command   3  Language- read & follow direction   0  Write a sentence   0  Copy design   0  Total score   20        08/31/2023   11:02 AM 03/03/2023    2:26 PM 02/26/2022   12:49 PM  6CIT Screen  What Year? 0 points 0 points 0 points  What month? 0 points 0 points 0 points  What time? 0 points 0 points 0 points  Count back from 20 0 points 0 points 0 points  Months in reverse 0 points 0 points  0 points  Repeat phrase 0 points 0 points 0 points  Total Score 0 points 0 points 0 points    Immunizations Immunization History  Administered Date(s) Administered   Fluad Quad(high Dose 65+) 01/17/2019, 01/27/2021   Influenza Split 02/09/2012   Influenza Whole 01/17/2013   Influenza, High Dose Seasonal PF 01/18/2020, 01/25/2022, 12/27/2022   Influenza, Seasonal, Injecte, Preservative Fre 01/13/2015   Influenza,inj,Quad PF,6+ Mos 01/18/2014, 01/10/2018   Influenza-Unspecified 01/18/2016, 01/26/2017   PFIZER Comirnaty(Gray Top)Covid-19 Tri-Sucrose Vaccine 10/23/2020   PFIZER(Purple Top)SARS-COV-2 Vaccination 05/26/2019, 06/20/2019, 02/18/2020   Pfizer Covid-19 Vaccine Bivalent Booster 58yrs & up 01/27/2021   Pfizer(Comirnaty)Fall Seasonal Vaccine 12 years and older 01/25/2022, 12/27/2022   Pneumococcal Conjugate-13 09/12/2014   Pneumococcal Polysaccharide-23 02/14/2013   Td 10/02/2007   Tdap 01/11/2018, 10/13/2018   Zoster Recombinant(Shingrix) 10/15/2019, 12/27/2019    Screening Tests Health Maintenance  Topic Date Due   COVID-19 Vaccine (8 - Pfizer  risk 2024-25 season) 06/26/2023   HEMOGLOBIN A1C  09/04/2023   FOOT EXAM  09/06/2023   INFLUENZA VACCINE  11/18/2023   OPHTHALMOLOGY EXAM  01/24/2024   Diabetic kidney evaluation - eGFR measurement  03/06/2024   Diabetic kidney evaluation - Urine ACR  03/06/2024   MAMMOGRAM  04/06/2024   Medicare Annual Wellness (AWV)  08/30/2024   Colonoscopy  06/02/2025   DTaP/Tdap/Td (4 - Td or Tdap) 10/12/2028   Pneumonia Vaccine 55+ Years old  Completed   DEXA SCAN  Completed   Hepatitis C Screening  Completed   Zoster Vaccines- Shingrix  Completed   HPV VACCINES  Aged Out   Meningococcal B Vaccine  Aged Out    Health Maintenance  Health Maintenance Due  Topic Date Due   COVID-19 Vaccine (8 - Pfizer risk 2024-25 season) 06/26/2023   Health Maintenance Items Addressed: None needed at this time  Additional Screening:  Vision Screening: Recommended annual ophthalmology exams for early detection of glaucoma and other disorders of the eye.  Dental Screening: Recommended annual dental exams for proper oral hygiene  Community Resource Referral / Chronic Care Management: CRR required this visit?  No   CCM required this visit?  No   Plan:    I have personally reviewed and noted the following in the patient's chart:   Medical and social history Use of alcohol, tobacco or illicit drugs  Current medications and supplements including opioid prescriptions. Patient is not currently taking opioid prescriptions. Functional ability and status Nutritional status Physical activity Advanced directives List of other physicians Hospitalizations, surgeries, and ER visits in previous 12 months Vitals Screenings to include cognitive, depression, and falls Referrals and appointments  In addition, I have reviewed and discussed with patient certain preventive protocols, quality metrics, and best practice recommendations. A written personalized care plan for preventive services as well as general  preventive health recommendations were provided to patient.   Betty Bannister, LPN   [id number]   After Visit Summary: (MyChart) Due to this being a telephonic visit, the after visit summary with patients personalized plan was offered to patient via MyChart   Notes: Nothing significant to report at this time.

## 2023-08-31 NOTE — Patient Instructions (Signed)
 Ms. Betty Sanders , Thank you for taking time out of your busy schedule to complete your Annual Wellness Visit with me. I enjoyed our conversation and look forward to speaking with you again next year. I, as well as your care team,  appreciate your ongoing commitment to your health goals. Please review the following plan we discussed and let me know if I can assist you in the future. Your Game plan/ To Do List    Follow up Visits: Next Medicare AWV with our clinical staff: 08/31/24 @ 10:50am televisit   Have you seen your provider in the last 6 months (3 months if uncontrolled diabetes)? No Next Office Visit with your provider: 09/13/23  Clinician Recommendations:  Aim for 30 minutes of exercise or brisk walking, 6-8 glasses of water, and 5 servings of fruits and vegetables each day.       This is a list of the screening recommended for you and due dates:  Health Maintenance  Topic Date Due   COVID-19 Vaccine (8 - Pfizer risk 2024-25 season) 06/26/2023   Hemoglobin A1C  09/04/2023   Complete foot exam   09/06/2023   Flu Shot  11/18/2023   Eye exam for diabetics  01/24/2024   Yearly kidney function blood test for diabetes  03/06/2024   Yearly kidney health urinalysis for diabetes  03/06/2024   Mammogram  04/06/2024   Medicare Annual Wellness Visit  08/30/2024   Colon Cancer Screening  06/02/2025   DTaP/Tdap/Td vaccine (4 - Td or Tdap) 10/12/2028   Pneumonia Vaccine  Completed   DEXA scan (bone density measurement)  Completed   Hepatitis C Screening  Completed   Zoster (Shingles) Vaccine  Completed   HPV Vaccine  Aged Out   Meningitis B Vaccine  Aged Out    Advanced directives: (In Chart) A copy of your advanced directives are scanned into your chart should your provider ever need it. Advance Care Planning is important because it:  [x]  Makes sure you receive the medical care that is consistent with your values, goals, and preferences  [x]  It provides guidance to your family and loved  ones and reduces their decisional burden about whether or not they are making the right decisions based on your wishes.  Follow the link provided in your after visit summary or read over the paperwork we have mailed to you to help you started getting your Advance Directives in place. If you need assistance in completing these, please reach out to us  so that we can help you!

## 2023-09-06 ENCOUNTER — Encounter: Payer: Self-pay | Admitting: Family Medicine

## 2023-09-13 ENCOUNTER — Ambulatory Visit (INDEPENDENT_AMBULATORY_CARE_PROVIDER_SITE_OTHER): Payer: PPO | Admitting: Family Medicine

## 2023-09-13 ENCOUNTER — Encounter: Payer: Self-pay | Admitting: Family Medicine

## 2023-09-13 VITALS — BP 122/64 | HR 65 | Temp 97.9°F | Ht 63.5 in | Wt 164.2 lb

## 2023-09-13 DIAGNOSIS — Z7984 Long term (current) use of oral hypoglycemic drugs: Secondary | ICD-10-CM

## 2023-09-13 DIAGNOSIS — E1169 Type 2 diabetes mellitus with other specified complication: Secondary | ICD-10-CM | POA: Diagnosis not present

## 2023-09-13 DIAGNOSIS — R011 Cardiac murmur, unspecified: Secondary | ICD-10-CM

## 2023-09-13 DIAGNOSIS — E538 Deficiency of other specified B group vitamins: Secondary | ICD-10-CM

## 2023-09-13 LAB — POCT GLYCOSYLATED HEMOGLOBIN (HGB A1C): Hemoglobin A1C: 6.9 % — AB (ref 4.0–5.6)

## 2023-09-13 NOTE — Patient Instructions (Addendum)
 Consider stelo OTC continuous glucose monitor.  Good to see you today  Return in 6 months for physical.  Work on incorporating physical activity into routine.

## 2023-09-13 NOTE — Assessment & Plan Note (Addendum)
 Continue daily replacement. Pt asxs, notes improvement in energy levels since starting replacement.   Update levels next labs.

## 2023-09-13 NOTE — Progress Notes (Signed)
 Ph: [phone] Fax: [phone]   Patient ID: Betty Sanders, female    DOB: [birth year], 77 y.o.   MRN: [id number]  This visit was conducted in person.  BP 122/64   Pulse 65   Temp 97.9 F (36.6 C) (Oral)   Ht 5' 3.5" (1.613 m)   Wt 164 lb 4 oz (74.5 kg)   SpO2 98%   BMI 28.64 kg/m    CC: DM f/u visit  Subjective:   HPI: Betty Sanders is a 78 y.o. female presenting on 09/13/2023 for Medical Management of Chronic Issues (Here for 6 mo DM f/u. )   Recent wellness exam 08/2023.   Ongoing hearing loss - saw ENT and audiology - hearing aides didn't really help, nor did nasal spray.   Vit B12 deficiency - managed with daily 1000mcg b12 orally. Denies symptoms of deficiency.   DM - does intermittently check sugars. Compliant with antihyperglycemic regimen which includes: trulicity  0.75mg  weekly, metformin  XR 1000mg  bid. Denies low sugars or hypoglycemic symptoms. Denies paresthesias, blurry vision. Last diabetic eye exam 01/2023. Glucometer brand: one touch verio IQ. Last foot exam: DUE. DSME: completed 2015 at [facility]  Lab Results  Component Value Date   HGBA1C 6.9 (A) 09/13/2023   Diabetic Foot Exam - Simple   No data filed    Lab Results  Component Value Date   MICROALBUR <0.7 03/07/2023         Relevant past medical, surgical, family and social history reviewed and updated as indicated. Interim medical history since our last visit reviewed. Allergies and medications reviewed and updated. Outpatient Medications Prior to Visit  Medication Sig Dispense Refill   Blood Glucose Monitoring Suppl (ONETOUCH VERIO IQ SYSTEM) w/Device KIT Check blood sugar twice a day and as directed. Dx E11.9 1 kit 0   Cholecalciferol (VITAMIN D3) 25 MCG (1000 UT) CAPS Take 1 capsule (1,000 Units total) by mouth daily. 30 capsule    Coenzyme Q10 (COQ10) 50 MG CAPS Take 1 capsule by mouth in the morning and at bedtime.     cyanocobalamin  (VITAMIN B12) 1000 MCG tablet Take 1 tablet  (1,000 mcg total) by mouth daily.     Dulaglutide  (TRULICITY ) 0.75 MG/0.5ML SOAJ Inject 0.75 mg into the skin once a week. 6 mL 4   ezetimibe  (ZETIA ) 10 MG tablet Take 1 tablet (10 mg total) by mouth daily. 90 tablet 4   glucose blood (ONETOUCH VERIO) test strip Check blood sugar twice a day. 200 strip 3   metFORMIN  (GLUCOPHAGE -XR) 500 MG 24 hr tablet Take 2 tablets (1,000 mg total) by mouth in the morning and at bedtime. 360 tablet 4   ONETOUCH DELICA LANCETS 33G MISC Check blood sugar twice a day and as directed. Dx E11.9 100 each 6   Probiotic Product (PROBIOTIC PO) Take by mouth daily.     triamcinolone (NASACORT) 55 MCG/ACT AERO nasal inhaler 2 sprays 2 (two) times daily.     vitamin C (ASCORBIC ACID) 500 MG tablet Take 500 mg by mouth as needed.     No facility-administered medications prior to visit.     Per HPI unless specifically indicated in ROS section below Review of Systems  Objective:  BP 122/64   Pulse 65   Temp 97.9 F (36.6 C) (Oral)   Ht 5' 3.5" (1.613 m)   Wt 164 lb 4 oz (74.5 kg)   SpO2 98%   BMI 28.64 kg/m   Wt Readings from Last 3 Encounters:  09/13/23 164  lb 4 oz (74.5 kg)  08/31/23 158 lb (71.7 kg)  03/14/23 158 lb 8 oz (71.9 kg)      Physical Exam Vitals and nursing note reviewed.  Constitutional:      Appearance: Normal appearance. She is not ill-appearing.  HENT:     Head: Normocephalic and atraumatic.  Eyes:     Extraocular Movements: Extraocular movements intact.     Conjunctiva/sclera: Conjunctivae normal.     Pupils: Pupils are equal, round, and reactive to light.  Neck:     Thyroid : No thyroid  mass, thyromegaly or thyroid  tenderness.  Cardiovascular:     Rate and Rhythm: Normal rate and regular rhythm.     Pulses: Normal pulses.     Heart sounds: Murmur (3/6 systolic) heard.  Pulmonary:     Effort: Pulmonary effort is normal. No respiratory distress.     Breath sounds: Normal breath sounds. No wheezing, rhonchi or rales.   Musculoskeletal:     Cervical back: Normal range of motion and neck supple.     Right lower leg: No edema.     Left lower leg: No edema.  Skin:    General: Skin is warm and dry.     Findings: No rash.  Neurological:     Mental Status: She is alert.  Psychiatric:        Mood and Affect: Mood normal.        Behavior: Behavior normal.       Results for orders placed or performed in visit on 09/13/23  POCT glycosylated hemoglobin (Hb A1C)   Collection Time: 09/13/23  9:24 AM  Result Value Ref Range   Hemoglobin A1C 6.9 (A) 4.0 - 5.6 %   HbA1c POC (<> result, manual entry)     HbA1c, POC (prediabetic range)     HbA1c, POC (controlled diabetic range)      Assessment & Plan:   Problem List Items Addressed This Visit     Type 2 diabetes mellitus with other specified complication (HCC) - Primary   Chronic, stable. Continue current regimen.  Encouraged increasing physical activity.       Relevant Orders   POCT glycosylated hemoglobin (Hb A1C) (Completed)   Systolic murmur   Again heard today, stable. Thought aortic sclerosis based on last echo 03/2022.       Vitamin B12 deficiency   Continue daily replacement. Pt asxs, notes improvement in energy levels since starting replacement.   Update levels next labs.        No orders of the defined types were placed in this encounter.   Orders Placed This Encounter  Procedures   POCT glycosylated hemoglobin (Hb A1C)    Patient Instructions  Consider stelo OTC continuous glucose monitor.  Good to see you today  Return in 6 months for physical.  Work on incorporating physical activity into routine.   Follow up plan: Return in about 6 months (around 03/15/2024) for annual exam, prior fasting for blood work.  Claire Crick, MD

## 2023-09-13 NOTE — Assessment & Plan Note (Signed)
 Chronic, stable. Continue current regimen.  Encouraged increasing physical activity.

## 2023-09-13 NOTE — Assessment & Plan Note (Signed)
 Again heard today, stable. Thought aortic sclerosis based on last echo 03/2022.

## 2023-10-02 ENCOUNTER — Encounter: Payer: Self-pay | Admitting: Family Medicine

## 2023-10-02 DIAGNOSIS — E118 Type 2 diabetes mellitus with unspecified complications: Secondary | ICD-10-CM

## 2023-10-03 MED ORDER — ONETOUCH VERIO VI STRP: ORAL_STRIP | 3 refills | Status: AC

## 2024-01-16 DIAGNOSIS — L301 Dyshidrosis [pompholyx]: Secondary | ICD-10-CM | POA: Diagnosis not present

## 2024-01-16 DIAGNOSIS — L821 Other seborrheic keratosis: Secondary | ICD-10-CM | POA: Diagnosis not present

## 2024-01-16 DIAGNOSIS — L565 Disseminated superficial actinic porokeratosis (DSAP): Secondary | ICD-10-CM | POA: Diagnosis not present

## 2024-01-16 DIAGNOSIS — L812 Freckles: Secondary | ICD-10-CM | POA: Diagnosis not present

## 2024-01-16 DIAGNOSIS — L570 Actinic keratosis: Secondary | ICD-10-CM | POA: Diagnosis not present

## 2024-01-16 DIAGNOSIS — D1801 Hemangioma of skin and subcutaneous tissue: Secondary | ICD-10-CM | POA: Diagnosis not present

## 2024-01-16 DIAGNOSIS — Z85828 Personal history of other malignant neoplasm of skin: Secondary | ICD-10-CM | POA: Diagnosis not present

## 2024-01-23 ENCOUNTER — Encounter: Payer: Self-pay | Admitting: Family Medicine

## 2024-01-23 DIAGNOSIS — E119 Type 2 diabetes mellitus without complications: Secondary | ICD-10-CM | POA: Diagnosis not present

## 2024-01-23 DIAGNOSIS — M25561 Pain in right knee: Secondary | ICD-10-CM | POA: Diagnosis not present

## 2024-01-30 DIAGNOSIS — E119 Type 2 diabetes mellitus without complications: Secondary | ICD-10-CM | POA: Diagnosis not present

## 2024-01-30 DIAGNOSIS — Z961 Presence of intraocular lens: Secondary | ICD-10-CM | POA: Diagnosis not present

## 2024-01-30 LAB — OPHTHALMOLOGY REPORT-SCANNED

## 2024-01-31 ENCOUNTER — Encounter: Payer: Self-pay | Admitting: Family Medicine

## 2024-03-10 ENCOUNTER — Other Ambulatory Visit: Payer: Self-pay | Admitting: Family Medicine

## 2024-03-10 DIAGNOSIS — R718 Other abnormality of red blood cells: Secondary | ICD-10-CM

## 2024-03-10 DIAGNOSIS — K760 Fatty (change of) liver, not elsewhere classified: Secondary | ICD-10-CM

## 2024-03-10 DIAGNOSIS — E538 Deficiency of other specified B group vitamins: Secondary | ICD-10-CM

## 2024-03-10 DIAGNOSIS — E1169 Type 2 diabetes mellitus with other specified complication: Secondary | ICD-10-CM

## 2024-03-10 DIAGNOSIS — M85852 Other specified disorders of bone density and structure, left thigh: Secondary | ICD-10-CM

## 2024-03-12 ENCOUNTER — Other Ambulatory Visit: Payer: Self-pay | Admitting: Family Medicine

## 2024-03-12 DIAGNOSIS — Z1231 Encounter for screening mammogram for malignant neoplasm of breast: Secondary | ICD-10-CM

## 2024-03-14 ENCOUNTER — Other Ambulatory Visit

## 2024-03-14 DIAGNOSIS — E1169 Type 2 diabetes mellitus with other specified complication: Secondary | ICD-10-CM | POA: Diagnosis not present

## 2024-03-14 DIAGNOSIS — E538 Deficiency of other specified B group vitamins: Secondary | ICD-10-CM

## 2024-03-14 DIAGNOSIS — R718 Other abnormality of red blood cells: Secondary | ICD-10-CM | POA: Diagnosis not present

## 2024-03-14 DIAGNOSIS — E785 Hyperlipidemia, unspecified: Secondary | ICD-10-CM | POA: Diagnosis not present

## 2024-03-14 DIAGNOSIS — K760 Fatty (change of) liver, not elsewhere classified: Secondary | ICD-10-CM | POA: Diagnosis not present

## 2024-03-14 DIAGNOSIS — M85852 Other specified disorders of bone density and structure, left thigh: Secondary | ICD-10-CM | POA: Diagnosis not present

## 2024-03-14 LAB — LIPID PANEL
Cholesterol: 166 mg/dL (ref 0–200)
HDL: 47.6 mg/dL (ref >39.00)
LDL Cholesterol: 99 mg/dL (ref 0–99)
NonHDL: 118.89
Total CHOL/HDL Ratio: 3
Triglycerides: 98 mg/dL (ref 0.0–149.0)
VLDL: 19.6 mg/dL (ref 0.0–40.0)

## 2024-03-14 LAB — CBC WITH DIFFERENTIAL/PLATELET
Basophils Absolute: 0.1 K/uL (ref 0.0–0.1)
Basophils Relative: 1.2 % (ref 0.0–3.0)
Eosinophils Absolute: 0.1 K/uL (ref 0.0–0.7)
Eosinophils Relative: 2.3 % (ref 0.0–5.0)
HCT: 33 % — ABNORMAL LOW (ref 36.0–46.0)
Hemoglobin: 10.9 g/dL — ABNORMAL LOW (ref 12.0–15.0)
Lymphocytes Relative: 38.9 % (ref 12.0–46.0)
Lymphs Abs: 2.2 K/uL (ref 0.7–4.0)
MCHC: 32.9 g/dL (ref 30.0–36.0)
MCV: 69.3 fl — ABNORMAL LOW (ref 78.0–100.0)
Monocytes Absolute: 0.4 K/uL (ref 0.1–1.0)
Monocytes Relative: 7.3 % (ref 3.0–12.0)
Neutro Abs: 2.8 K/uL (ref 1.4–7.7)
Neutrophils Relative %: 50.3 % (ref 43.0–77.0)
Platelets: 371 K/uL (ref 150.0–400.0)
RBC: 4.76 Mil/uL (ref 3.87–5.11)
RDW: 15.9 % — ABNORMAL HIGH (ref 11.5–15.5)
WBC: 5.6 K/uL (ref 4.0–10.5)

## 2024-03-14 LAB — IBC PANEL
Iron: 44 ug/dL (ref 42–145)
Saturation Ratios: 9.2 % — ABNORMAL LOW (ref 20.0–50.0)
TIBC: 480.2 ug/dL — ABNORMAL HIGH (ref 250.0–450.0)
Transferrin: 343 mg/dL (ref 212.0–360.0)

## 2024-03-14 LAB — COMPREHENSIVE METABOLIC PANEL WITH GFR
ALT: 14 U/L (ref 0–35)
AST: 20 U/L (ref 0–37)
Albumin: 4.6 g/dL (ref 3.5–5.2)
Alkaline Phosphatase: 64 U/L (ref 39–117)
BUN: 14 mg/dL (ref 6–23)
CO2: 26 meq/L (ref 19–32)
Calcium: 9.7 mg/dL (ref 8.4–10.5)
Chloride: 102 meq/L (ref 96–112)
Creatinine, Ser: 0.67 mg/dL (ref 0.40–1.20)
GFR: 85.01 mL/min (ref >60.00)
Glucose, Bld: 137 mg/dL — ABNORMAL HIGH (ref 70–99)
Potassium: 4.1 meq/L (ref 3.5–5.1)
Sodium: 138 meq/L (ref 135–145)
Total Bilirubin: 0.6 mg/dL (ref 0.2–1.2)
Total Protein: 7.1 g/dL (ref 6.0–8.3)

## 2024-03-14 LAB — MICROALBUMIN / CREATININE URINE RATIO
Creatinine,U: 74.9 mg/dL
Microalb Creat Ratio: UNDETERMINED mg/g (ref 0.0–30.0)
Microalb, Ur: <0.7 mg/dL

## 2024-03-14 LAB — VITAMIN B12: Vitamin B-12: 1168 pg/mL — ABNORMAL HIGH (ref 211–911)

## 2024-03-14 LAB — FERRITIN: Ferritin: 4.8 ng/mL — ABNORMAL LOW (ref 10.0–291.0)

## 2024-03-14 LAB — VITAMIN D 25 HYDROXY (VIT D DEFICIENCY, FRACTURES): VITD: 47.56 ng/mL (ref 30.00–100.00)

## 2024-03-14 LAB — HEMOGLOBIN A1C: Hgb A1c MFr Bld: 6.8 % — ABNORMAL HIGH (ref 4.6–6.5)

## 2024-03-15 LAB — TIQ- AMBIGUOUS ORDER

## 2024-03-19 ENCOUNTER — Ambulatory Visit: Payer: Self-pay | Admitting: Family Medicine

## 2024-03-20 ENCOUNTER — Encounter: Payer: Self-pay | Admitting: Family Medicine

## 2024-03-20 ENCOUNTER — Ambulatory Visit: Admitting: Family Medicine

## 2024-03-20 VITALS — BP 116/64 | HR 67 | Temp 97.5°F | Ht 63.5 in | Wt 162.2 lb

## 2024-03-20 DIAGNOSIS — Z0000 Encounter for general adult medical examination without abnormal findings: Secondary | ICD-10-CM | POA: Diagnosis not present

## 2024-03-20 DIAGNOSIS — D509 Iron deficiency anemia, unspecified: Secondary | ICD-10-CM | POA: Diagnosis not present

## 2024-03-20 DIAGNOSIS — T466X5A Adverse effect of antihyperlipidemic and antiarteriosclerotic drugs, initial encounter: Secondary | ICD-10-CM | POA: Diagnosis not present

## 2024-03-20 DIAGNOSIS — R011 Cardiac murmur, unspecified: Secondary | ICD-10-CM

## 2024-03-20 DIAGNOSIS — Z23 Encounter for immunization: Secondary | ICD-10-CM

## 2024-03-20 DIAGNOSIS — Z0001 Encounter for general adult medical examination with abnormal findings: Secondary | ICD-10-CM

## 2024-03-20 DIAGNOSIS — E785 Hyperlipidemia, unspecified: Secondary | ICD-10-CM

## 2024-03-20 DIAGNOSIS — M791 Myalgia, unspecified site: Secondary | ICD-10-CM | POA: Diagnosis not present

## 2024-03-20 DIAGNOSIS — E538 Deficiency of other specified B group vitamins: Secondary | ICD-10-CM | POA: Diagnosis not present

## 2024-03-20 DIAGNOSIS — E1169 Type 2 diabetes mellitus with other specified complication: Secondary | ICD-10-CM

## 2024-03-20 DIAGNOSIS — Z7189 Other specified counseling: Secondary | ICD-10-CM

## 2024-03-20 DIAGNOSIS — M85852 Other specified disorders of bone density and structure, left thigh: Secondary | ICD-10-CM | POA: Diagnosis not present

## 2024-03-20 LAB — POC URINALSYSI DIPSTICK (AUTOMATED)
Bilirubin, UA: NEGATIVE
Blood, UA: NEGATIVE
Glucose, UA: NEGATIVE
Ketones, UA: NEGATIVE
Leukocytes, UA: NEGATIVE
Nitrite, UA: NEGATIVE
Protein, UA: NEGATIVE
Spec Grav, UA: 1.025 (ref 1.010–1.025)
Urobilinogen, UA: 0.2 U/dL
pH, UA: 5.5 (ref 5.0–8.0)

## 2024-03-20 MED ORDER — TRULICITY 0.75 MG/0.5ML ~~LOC~~ SOAJ: 0.7500 mg | SUBCUTANEOUS | 3 refills | Status: AC

## 2024-03-20 MED ORDER — METFORMIN HCL ER 500 MG PO TB24: 1000.0000 mg | ORAL_TABLET | Freq: Two times a day (BID) | ORAL | 3 refills | Status: AC

## 2024-03-20 MED ORDER — EZETIMIBE 10 MG PO TABS: 10.0000 mg | ORAL_TABLET | Freq: Every day | ORAL | 3 refills | Status: AC

## 2024-03-20 MED ORDER — VITAMIN B-12 1000 MCG PO TABS: 1000.0000 ug | ORAL_TABLET | ORAL | Status: AC

## 2024-03-20 NOTE — Progress Notes (Signed)
 Ph: [phone] Fax: [phone]   Patient ID: Betty Sanders, female    DOB: 05/31/1945, 78 y.o.   MRN: [id number]  This visit was conducted in person.  BP 116/64   Pulse 67   Temp (!) 97.5 F (36.4 C) (Oral)   Ht 5' 3.5 (1.613 m)   Wt 162 lb 3.2 oz (73.6 kg)   SpO2 100%   BMI 28.28 kg/m    CC: CPE Subjective:   HPI: Betty Sanders is a 78 y.o. female presenting on 03/20/2024 for Annual Exam (Would to discuss blood test results, no other concerns at this time)   Saw health advisor 08/2023 for medicare wellness visit. Note reviewed.   No results found.  Flowsheet Row Office Visit from 03/20/2024 in [facility]  PHQ-2 Total Score 0       03/20/2024    9:27 AM 09/13/2023    9:49 AM 08/31/2023   10:58 AM 03/03/2023    2:32 PM 02/28/2023    4:27 PM  Fall Risk   Falls in the past year? 0 0 0 0 0  Number falls in past yr: 0  0 0 0  Injury with Fall? 0  0  0  0   Risk for fall due to : No Fall Risks  No Fall Risks No Fall Risks   Follow up   Education provided;Falls prevention discussed Falls prevention discussed      Data saved with a previous flowsheet row definition   Some R knee trouble - lateral meniscal tear vs arthritis. Saw ortho, treated with steroid injection 01/2024 followed by meloxicam 7.5mg  course x1 month which she seemed to tolerate well. Has also been dealing with sciatica.   New IDA - no blood noted in stool or urine, no vaginal bleeding. Notes new mild exertional dyspnea as well as increased fatigue and intermittent symptoms of RLS. Still able to walk dog 3 miles/day, up and down hills etc.   HLD - continues zetia  10mg  daily. Off crestor  6 months ago - stopped due to myalgias.   DM - continues  metformin  XR 1000mg  bid and Trulicity  0.75mg  weekly. Has stopped amaryl  1/2 tab. Fasting this morning 109. Recent steroid course for sinuses. She used Dexcom x1 - sugars overall stable Lab Results  Component Value Date    HGBA1C 6.8 (H) 03/14/2024     Preventative: COLONOSCOPY Date: 05/2015 diverticulosis, rpt 10 yrs [place])  Mammogram 03/2023, [facility] 1 @ [place] - rpt already scheduled.  Well woman exam - always normal. Last 10/2016. [facility] (aunt) GYN cancer, unsure type. Will age out. no pelvic pain, vaginal bleeding, skin changes.  DEXA (03/2020) T -1.7 (L femur). Good yogurt and ice cream and weight bearing exercise.  Lung cancer screen - not eligible  Flu shot yearly  COVID vaccine - Pfizer 05/2019, 06/2019, Pfizer booster 02/2020, 10/2020, bivalent 01/2021, 01/2022  Td 2009, Tdap 2019, 2020 Pneumovax 01/2013, prevnar-13 2016, prevnar-20 today  Shingrix - completed 09/2019, 12/2019  Advanced planning - scanned into chart 2022. Sister Betty Sanders is HCPOA. does not want prolonged life support if terminal condition. Wants HCPOA recommendations followed.  Seat belt use discussed Sunscreen use discussed - no changing moles on skin. Sees dermatology Dr Jones q40mo. 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 Activity: kayak, restarting treadmill, wears fit bit - wants to get to 10000steps/day Diet: My.fitness.pal 1200cal/day, good water, fruits/vegetables daily      Relevant past medical, surgical, family and social history reviewed and updated as indicated. Interim medical history since our last visit reviewed. Allergies and medications reviewed and updated. Outpatient Medications Prior to Visit  Medication Sig Dispense Refill   betamethasone dipropionate 0.05 % cream Apply 0.05 % topically 2 (two) times daily.     Blood Glucose Monitoring Suppl (ONETOUCH VERIO IQ SYSTEM) w/Device KIT Check blood sugar twice a day and as directed. Dx E11.9 1 kit 0   Cholecalciferol (VITAMIN D3) 25 MCG (1000 UT) CAPS Take 1  capsule (1,000 Units total) by mouth daily. 30 capsule    Coenzyme Q10 (COQ10) 50 MG CAPS Take 1 capsule by mouth in the morning and at bedtime.     glucose blood (ONETOUCH VERIO) test strip Check blood sugar twice a day. 200 strip 3   ONETOUCH DELICA LANCETS 33G MISC Check blood sugar twice a day and as directed. Dx E11.9 100 each 6   Probiotic Product (PROBIOTIC PO) Take by mouth daily.     triamcinolone  (NASACORT) 55 MCG/ACT AERO nasal inhaler 2 sprays 2 (two) times daily.     cyanocobalamin  (VITAMIN B12) 1000 MCG tablet Take 1 tablet (1,000 mcg total) by mouth daily.     Dulaglutide  (TRULICITY ) 0.75 MG/0.5ML SOAJ Inject 0.75 mg into the skin once a week. 6 mL 4   ezetimibe  (ZETIA ) 10 MG tablet Take 1 tablet (10 mg total) by mouth daily. 90 tablet 4   metFORMIN  (GLUCOPHAGE -XR) 500 MG 24 hr tablet Take 2 tablets (1,000 mg total) by mouth in the morning and at bedtime. 360 tablet 4   No facility-administered medications prior to visit.     Per HPI unless specifically indicated in ROS section below Review of Systems  Constitutional:  Negative for activity change, appetite change, chills, fatigue, fever and unexpected weight change.  HENT:  Positive for congestion (sinus). Negative for hearing loss.   Eyes:  Negative for visual disturbance.  Respiratory:  Positive for shortness of breath (exertional). Negative for cough, chest tightness and wheezing.   Cardiovascular:  Negative for chest pain, palpitations and leg swelling.  Gastrointestinal:  Negative for abdominal distention, abdominal pain, blood in stool, constipation, diarrhea, nausea and vomiting.  Genitourinary:  Negative for difficulty urinating and hematuria.  Musculoskeletal:  Negative for arthralgias, myalgias and neck pain.       R knee swelling - see HPI  Skin:  Negative for rash.  Neurological:  Positive for headaches (occ, sinus related). Negative for dizziness, seizures and syncope.  Hematological:  Negative for adenopathy.  Bruises/bleeds easily.  Psychiatric/Behavioral:  Negative for dysphoric mood. The patient is not nervous/anxious.     Objective:  BP 116/64   Pulse 67   Temp (!) 97.5 F (36.4 C) (Oral)   Ht 5' 3.5 (1.613 m)   Wt 162 lb 3.2 oz (73.6 kg)   SpO2 100%   BMI 28.28 kg/m   Wt Readings from Last 3 Encounters:  03/20/24 162 lb 3.2 oz (73.6 kg)  09/13/23 164 lb 4 oz (74.5 kg)  08/31/23 158 lb (71.7 kg)      Physical Exam Vitals and nursing note reviewed.  Constitutional:      Appearance: Normal appearance. She is not ill-appearing.  HENT:     Head: Normocephalic and atraumatic.     Right Ear: Tympanic  membrane, ear canal and external ear normal. There is no impacted cerumen.     Left Ear: Tympanic membrane, ear canal and external ear normal. There is no impacted cerumen.     Mouth/Throat:     Mouth: Mucous membranes are moist.     Pharynx: Oropharynx is clear. No oropharyngeal exudate or posterior oropharyngeal erythema.  Eyes:     General:        Right eye: No discharge.        Left eye: No discharge.     Extraocular Movements: Extraocular movements intact.     Conjunctiva/sclera: Conjunctivae normal.     Pupils: Pupils are equal, round, and reactive to light.  Neck:     Thyroid : No thyroid  mass or thyromegaly.     Vascular: Carotid bruit (?referred from heart) present.  Cardiovascular:     Rate and Rhythm: Normal rate and regular rhythm.     Pulses: Normal pulses.     Heart sounds: Murmur (3/6 systolic throughout) heard.  Pulmonary:     Effort: Pulmonary effort is normal. No respiratory distress.     Breath sounds: Normal breath sounds. No wheezing, rhonchi or rales.  Abdominal:     General: Bowel sounds are normal. There is no distension.     Palpations: Abdomen is soft. There is no mass.     Tenderness: There is no abdominal tenderness. There is no guarding or rebound.     Hernia: No hernia is present.  Musculoskeletal:     Cervical back: Normal range of motion and  neck supple. No rigidity.     Right lower leg: No edema.     Left lower leg: No edema.  Lymphadenopathy:     Cervical: No cervical adenopathy.  Skin:    General: Skin is warm and dry.     Findings: No rash.  Neurological:     General: No focal deficit present.     Mental Status: She is alert. Mental status is at baseline.  Psychiatric:        Mood and Affect: Mood normal.        Behavior: Behavior normal.    Diabetic Foot Exam - Simple   Simple Foot Form Diabetic Foot exam was performed with the following findings: Yes 03/20/2024 10:05 AM  Visual Inspection See comments: Yes Sensation Testing Intact to touch and monofilament testing bilaterally: Yes Pulse Check Posterior Tibialis and Dorsalis pulse intact bilaterally: Yes Comments No claudication Dri skin R>L midfoot sole, overriding R 2nd toe         Results for orders placed or performed in visit on 03/20/24  POCT Urinalysis Dipstick (Automated)   Collection Time: 03/20/24 10:28 AM  Result Value Ref Range   Color, UA yellow    Clarity, UA clear    Glucose, UA Negative Negative   Bilirubin, UA neg    Ketones, UA neg    Spec Grav, UA 1.025 1.010 - 1.025   Blood, UA neg    pH, UA 5.5 5.0 - 8.0   Protein, UA Negative Negative   Urobilinogen, UA 0.2 0.2 or 1.0 E.U./dL   Nitrite, UA neg    Leukocytes, UA Negative Negative    Assessment & Plan:   Problem List Items Addressed This Visit     Encounter for general adult medical examination with abnormal findings - Primary (Chronic)   Preventative protocols reviewed and updated unless pt declined. Discussed healthy diet and lifestyle.       Relevant Orders  POCT Urinalysis Dipstick (Automated) (Completed)   Advanced care planning/counseling discussion (Chronic)   Previously discussed      Relevant Orders   POCT Urinalysis Dipstick (Automated) (Completed)   Type 2 diabetes mellitus with other specified complication (HCC)   Chronic, stable on current regimen -  continue. Foot exam today       Relevant Medications   Dulaglutide  (TRULICITY ) 0.75 MG/0.5ML SOAJ   metFORMIN  (GLUCOPHAGE -XR) 500 MG 24 hr tablet   Other Relevant Orders   POCT Urinalysis Dipstick (Automated) (Completed)   Hyperlipidemia associated with type 2 diabetes mellitus (HCC)   Chronic, stable on zetia . Statin intolerance. Reviewed diet choices to maintain cholesterol control The 10-year ASCVD risk score (Arnett DK, et al., 2019) is: 26.5%   Values used to calculate the score:     Age: 86 years     Clincally relevant sex: Female     Is Non-Hispanic African American: No     Diabetic: Yes     Tobacco smoker: No     Systolic Blood Pressure: 116 mmHg     Is BP treated: No     HDL Cholesterol: 47.6 mg/dL     Total Cholesterol: 166 mg/dL       Relevant Medications   Dulaglutide  (TRULICITY ) 0.75 MG/0.5ML SOAJ   ezetimibe  (ZETIA ) 10 MG tablet   metFORMIN  (GLUCOPHAGE -XR) 500 MG 24 hr tablet   Other Relevant Orders   POCT Urinalysis Dipstick (Automated) (Completed)   Osteopenia   Continue calcium , vit D and regular weight bearing exercises Rpt DEXA next year.       Relevant Orders   POCT Urinalysis Dipstick (Automated) (Completed)   Systolic murmur   Again heard today, known aortic sclerosis, anticipate exacerbated by worsening anemia       Relevant Orders   POCT Urinalysis Dipstick (Automated) (Completed)   Myalgia due to statin   Relevant Orders   POCT Urinalysis Dipstick (Automated) (Completed)   Vitamin B12 deficiency   Levels now high . Stop daily replacement x 2 wks then start once weekly 1000mcg b12 replacement oral.      Relevant Orders   POCT Urinalysis Dipstick (Automated) (Completed)   Iron deficiency anemia   Newly noted mild microcytic anemia with marked iron deficiency, she is symptomatic (RLS symptoms, exertional dyspnea, fatigue). Start oral iron ferrous sulfate 325mg  (65FE) daily to every other day, reviewed optimal administration as well as  side effects to watch for. Update if intolerant to order iron infusion.  Check urinalysis and iFOB today.  Refer to GI for further evaluation.  Avoid NSAIDs      Relevant Medications   cyanocobalamin  (VITAMIN B12) 1000 MCG tablet   Other Relevant Orders   Fecal occult blood, imunochemical   Ambulatory referral to Gastroenterology   POCT Urinalysis Dipstick (Automated) (Completed)   Other Visit Diagnoses       Need for vaccination against Streptococcus pneumoniae       Relevant Orders   Pneumococcal conjugate vaccine 20-valent (Prevnar 20) (Completed)        Meds ordered this encounter  Medications   Dulaglutide  (TRULICITY ) 0.75 MG/0.5ML SOAJ    Sig: Inject 0.75 mg into the skin once a week.    Dispense:  6 mL    Refill:  3   ezetimibe  (ZETIA ) 10 MG tablet    Sig: Take 1 tablet (10 mg total) by mouth daily.    Dispense:  90 tablet    Refill:  3   metFORMIN  (GLUCOPHAGE -XR) 500 MG 24 hr  tablet    Sig: Take 2 tablets (1,000 mg total) by mouth in the morning and at bedtime.    Dispense:  360 tablet    Refill:  3   cyanocobalamin  (VITAMIN B12) 1000 MCG tablet    Sig: Take 1 tablet (1,000 mcg total) by mouth once a week.    Orders Placed This Encounter  Procedures   Fecal occult blood, imunochemical    Standing Status:   Future    Expiration Date:   03/20/2025   Pneumococcal conjugate vaccine 20-valent (Prevnar 20)   Ambulatory referral to Gastroenterology    Referral Priority:   Urgent    Referral Type:   Consultation    Referral Reason:   Specialty Services Required    Number of Visits Requested:   1   POCT Urinalysis Dipstick (Automated)    Patient Instructions  Prevnar-20 today (pneumonia shot)  Urinalysis today I will refer you back to GI for new iron deficiency anemia, and to discuss possible repeat colonoscopy or further evaluation. In the meantime, pass by lab to collect iFOB stool test. Stay off anti inflammatories for now.  Hold B12 for 2 weeks then start  once weekly b12 replacement.  Good to see you today Return in 6 months for diabetes follow up visit   Start ferrous sulfate 325mg  (65FE) daily to every other day.  You may call [facility] to schedule an appointment at [phone].    Follow up plan: Return in about 6 months (around 09/18/2024) for follow up visit.  Anton Blas, MD

## 2024-03-20 NOTE — Assessment & Plan Note (Signed)
 Levels now high . Stop daily replacement x 2 wks then start once weekly 1000mcg b12 replacement oral.

## 2024-03-20 NOTE — Patient Instructions (Addendum)
 Prevnar-20 today (pneumonia shot)  Urinalysis today I will refer you back to GI for new iron deficiency anemia, and to discuss possible repeat colonoscopy or further evaluation. In the meantime, pass by lab to collect iFOB stool test. Stay off anti inflammatories for now.  Hold B12 for 2 weeks then start once weekly b12 replacement.  Good to see you today Return in 6 months for diabetes follow up visit   Start ferrous sulfate 325mg  (65FE) daily to every other day.  You may call [facility] to schedule an appointment at [phone].

## 2024-03-20 NOTE — Assessment & Plan Note (Signed)
Chronic, stable on current regimen - continue. ?Foot exam today.  ?

## 2024-03-20 NOTE — Assessment & Plan Note (Signed)
 Chronic, stable on zetia . Statin intolerance. Reviewed diet choices to maintain cholesterol control The 10-year ASCVD risk score (Arnett DK, et al., 2019) is: 26.5%   Values used to calculate the score:     Age: 78 years     Clincally relevant sex: Female     Is Non-Hispanic African American: No     Diabetic: Yes     Tobacco smoker: No     Systolic Blood Pressure: 116 mmHg     Is BP treated: No     HDL Cholesterol: 47.6 mg/dL     Total Cholesterol: 166 mg/dL

## 2024-03-20 NOTE — Assessment & Plan Note (Signed)
 Continue calcium , vit D and regular weight bearing exercises Rpt DEXA next year.

## 2024-03-20 NOTE — Assessment & Plan Note (Signed)
 Again heard today, known aortic sclerosis, anticipate exacerbated by worsening anemia

## 2024-03-20 NOTE — Assessment & Plan Note (Signed)
 Previously discussed.

## 2024-03-20 NOTE — Assessment & Plan Note (Addendum)
 Newly noted mild microcytic anemia with marked iron deficiency, she is symptomatic (RLS symptoms, exertional dyspnea, fatigue). Start oral iron ferrous sulfate 325mg  (65FE) daily to every other day, reviewed optimal administration as well as side effects to watch for. Update if intolerant to order iron infusion.  Check urinalysis and iFOB today.  Refer to GI for further evaluation.  Avoid NSAIDs

## 2024-03-20 NOTE — Assessment & Plan Note (Signed)
 Preventative protocols reviewed and updated unless pt declined. Discussed healthy diet and lifestyle.

## 2024-03-21 LAB — TEST AUTHORIZATION

## 2024-03-21 LAB — INTRINSIC FACTOR BLOCKING ANTIBODY: Intrinsic Factor: NEGATIVE

## 2024-03-22 ENCOUNTER — Ambulatory Visit: Payer: Self-pay | Admitting: Family Medicine

## 2024-03-22 LAB — FECAL OCCULT BLOOD, IMMUNOCHEMICAL: Fecal Occult Bld: NEGATIVE

## 2024-04-17 ENCOUNTER — Ambulatory Visit
Admission: RE | Admit: 2024-04-17 | Discharge: 2024-04-17 | Disposition: A | Discharge status: Home / Self Care | Source: Ambulatory Visit | Attending: Family Medicine | Admitting: Family Medicine

## 2024-04-17 DIAGNOSIS — Z1231 Encounter for screening mammogram for malignant neoplasm of breast: Secondary | ICD-10-CM | POA: Insufficient documentation

## 2024-04-20 ENCOUNTER — Ambulatory Visit: Payer: Self-pay | Admitting: Family Medicine

## 2024-08-31 ENCOUNTER — Ambulatory Visit

## 2024-09-18 ENCOUNTER — Ambulatory Visit: Admitting: Family Medicine
